# Patient Record
Sex: Female | Born: 1960
Health system: Southern US, Community
[De-identification: ages and names within clinical notes are randomized; demographics above are authoritative.]

## PROBLEM LIST (undated history)

## (undated) DIAGNOSIS — M810 Age-related osteoporosis without current pathological fracture: Secondary | ICD-10-CM

## (undated) DIAGNOSIS — G8929 Other chronic pain: Secondary | ICD-10-CM

## (undated) DIAGNOSIS — E785 Hyperlipidemia, unspecified: Secondary | ICD-10-CM

## (undated) DIAGNOSIS — J45909 Unspecified asthma, uncomplicated: Secondary | ICD-10-CM

## (undated) DIAGNOSIS — R519 Headache, unspecified: Secondary | ICD-10-CM

## (undated) DIAGNOSIS — A159 Respiratory tuberculosis unspecified: Secondary | ICD-10-CM

## (undated) DIAGNOSIS — F419 Anxiety disorder, unspecified: Secondary | ICD-10-CM

## (undated) DIAGNOSIS — G43909 Migraine, unspecified, not intractable, without status migrainosus: Secondary | ICD-10-CM

## (undated) DIAGNOSIS — F5104 Psychophysiologic insomnia: Secondary | ICD-10-CM

## (undated) DIAGNOSIS — M545 Low back pain, unspecified: Secondary | ICD-10-CM

## (undated) DIAGNOSIS — Z8781 Personal history of (healed) traumatic fracture: Secondary | ICD-10-CM

## (undated) DIAGNOSIS — F32A Depression, unspecified: Secondary | ICD-10-CM

## (undated) DIAGNOSIS — R7611 Nonspecific reaction to tuberculin skin test without active tuberculosis: Secondary | ICD-10-CM

## (undated) DIAGNOSIS — M8580 Other specified disorders of bone density and structure, unspecified site: Secondary | ICD-10-CM

## (undated) DIAGNOSIS — Z8489 Family history of other specified conditions: Secondary | ICD-10-CM

## (undated) DIAGNOSIS — R51 Headache: Secondary | ICD-10-CM

## (undated) DIAGNOSIS — M1990 Unspecified osteoarthritis, unspecified site: Secondary | ICD-10-CM

## (undated) DIAGNOSIS — F329 Major depressive disorder, single episode, unspecified: Secondary | ICD-10-CM

## (undated) DIAGNOSIS — K8020 Calculus of gallbladder without cholecystitis without obstruction: Secondary | ICD-10-CM

## (undated) DIAGNOSIS — E079 Disorder of thyroid, unspecified: Secondary | ICD-10-CM

## (undated) DIAGNOSIS — K219 Gastro-esophageal reflux disease without esophagitis: Secondary | ICD-10-CM

## (undated) DIAGNOSIS — T7840XA Allergy, unspecified, initial encounter: Secondary | ICD-10-CM

## (undated) DIAGNOSIS — C541 Malignant neoplasm of endometrium: Secondary | ICD-10-CM

## (undated) DIAGNOSIS — D649 Anemia, unspecified: Secondary | ICD-10-CM

## (undated) HISTORY — DX: Age-related osteoporosis without current pathological fracture: M81.0

## (undated) HISTORY — DX: Respiratory tuberculosis unspecified: A15.9

## (undated) HISTORY — PX: AUGMENTATION MAMMAPLASTY: SUR837

## (undated) HISTORY — DX: Unspecified asthma, uncomplicated: J45.909

## (undated) HISTORY — DX: Disorder of thyroid, unspecified: E07.9

## (undated) HISTORY — DX: Major depressive disorder, single episode, unspecified: F32.9

## (undated) HISTORY — DX: Psychophysiologic insomnia: F51.04

## (undated) HISTORY — DX: Depression, unspecified: F32.A

## (undated) HISTORY — DX: Allergy, unspecified, initial encounter: T78.40XA

## (undated) HISTORY — PX: FOOT NEUROMA SURGERY: SHX646

## (undated) HISTORY — DX: Hyperlipidemia, unspecified: E78.5

## (undated) HISTORY — DX: Other specified disorders of bone density and structure, unspecified site: M85.80

## (undated) HISTORY — DX: Unspecified osteoarthritis, unspecified site: M19.90

## (undated) HISTORY — PX: TONSILLECTOMY: SUR1361

## (undated) HISTORY — DX: Personal history of (healed) traumatic fracture: Z87.81

## (undated) HISTORY — DX: Malignant neoplasm of endometrium: C54.1

## (undated) HISTORY — PX: WISDOM TOOTH EXTRACTION: SHX21

## (undated) HISTORY — PX: TOTAL ABDOMINAL HYSTERECTOMY: SHX209

---

## 1999-10-13 ENCOUNTER — Encounter (INDEPENDENT_AMBULATORY_CARE_PROVIDER_SITE_OTHER): Payer: Self-pay | Admitting: Specialist

## 1999-10-13 ENCOUNTER — Ambulatory Visit (HOSPITAL_BASED_OUTPATIENT_CLINIC_OR_DEPARTMENT_OTHER): Admission: RE | Admit: 1999-10-13 | Discharge: 1999-10-13 | Payer: Self-pay | Admitting: Otolaryngology

## 1999-12-12 ENCOUNTER — Encounter: Payer: Self-pay | Admitting: Emergency Medicine

## 1999-12-12 ENCOUNTER — Emergency Department (HOSPITAL_COMMUNITY): Admission: EM | Admit: 1999-12-12 | Discharge: 1999-12-12 | Payer: Self-pay | Admitting: Emergency Medicine

## 2000-02-07 ENCOUNTER — Ambulatory Visit: Admission: RE | Admit: 2000-02-07 | Discharge: 2000-02-07 | Payer: Self-pay | Admitting: Otolaryngology

## 2000-05-03 ENCOUNTER — Ambulatory Visit (HOSPITAL_BASED_OUTPATIENT_CLINIC_OR_DEPARTMENT_OTHER): Admission: RE | Admit: 2000-05-03 | Discharge: 2000-05-03 | Payer: Self-pay | Admitting: Otolaryngology

## 2000-11-22 ENCOUNTER — Other Ambulatory Visit: Admission: RE | Admit: 2000-11-22 | Discharge: 2000-11-22 | Payer: Self-pay | Admitting: Internal Medicine

## 2001-02-04 ENCOUNTER — Encounter: Admission: RE | Admit: 2001-02-04 | Discharge: 2001-02-04 | Payer: Self-pay | Admitting: Internal Medicine

## 2001-02-04 ENCOUNTER — Encounter: Payer: Self-pay | Admitting: Internal Medicine

## 2001-02-06 ENCOUNTER — Encounter: Admission: RE | Admit: 2001-02-06 | Discharge: 2001-02-06 | Payer: Self-pay | Admitting: Neurological Surgery

## 2001-02-06 ENCOUNTER — Encounter: Payer: Self-pay | Admitting: Neurological Surgery

## 2001-03-20 ENCOUNTER — Encounter: Payer: Self-pay | Admitting: Neurological Surgery

## 2001-03-20 ENCOUNTER — Encounter: Admission: RE | Admit: 2001-03-20 | Discharge: 2001-03-20 | Payer: Self-pay | Admitting: Neurological Surgery

## 2001-05-08 ENCOUNTER — Encounter: Payer: Self-pay | Admitting: Neurological Surgery

## 2001-05-08 ENCOUNTER — Encounter: Admission: RE | Admit: 2001-05-08 | Discharge: 2001-05-08 | Payer: Self-pay | Admitting: Neurological Surgery

## 2001-09-11 DIAGNOSIS — C541 Malignant neoplasm of endometrium: Secondary | ICD-10-CM

## 2001-09-11 HISTORY — DX: Malignant neoplasm of endometrium: C54.1

## 2002-05-29 ENCOUNTER — Other Ambulatory Visit: Admission: RE | Admit: 2002-05-29 | Discharge: 2002-05-29 | Payer: Self-pay

## 2002-06-03 ENCOUNTER — Encounter: Admission: RE | Admit: 2002-06-03 | Discharge: 2002-06-03 | Payer: Self-pay

## 2002-07-02 ENCOUNTER — Encounter: Admission: RE | Admit: 2002-07-02 | Discharge: 2002-07-02 | Payer: Self-pay

## 2002-07-04 ENCOUNTER — Encounter (INDEPENDENT_AMBULATORY_CARE_PROVIDER_SITE_OTHER): Payer: Self-pay

## 2002-07-04 ENCOUNTER — Encounter (INDEPENDENT_AMBULATORY_CARE_PROVIDER_SITE_OTHER): Payer: Self-pay | Admitting: *Deleted

## 2002-07-04 ENCOUNTER — Ambulatory Visit (HOSPITAL_COMMUNITY): Admission: RE | Admit: 2002-07-04 | Discharge: 2002-07-04 | Payer: Self-pay

## 2002-07-15 ENCOUNTER — Ambulatory Visit: Admission: RE | Admit: 2002-07-15 | Discharge: 2002-07-15 | Payer: Self-pay | Admitting: Gynecology

## 2002-07-28 ENCOUNTER — Encounter: Payer: Self-pay | Admitting: Gynecology

## 2002-07-29 ENCOUNTER — Encounter (INDEPENDENT_AMBULATORY_CARE_PROVIDER_SITE_OTHER): Payer: Self-pay

## 2002-07-29 ENCOUNTER — Inpatient Hospital Stay (HOSPITAL_COMMUNITY): Admission: RE | Admit: 2002-07-29 | Discharge: 2002-08-01 | Payer: Self-pay | Admitting: Gynecology

## 2002-07-29 ENCOUNTER — Encounter (INDEPENDENT_AMBULATORY_CARE_PROVIDER_SITE_OTHER): Payer: Self-pay | Admitting: Specialist

## 2002-09-17 ENCOUNTER — Ambulatory Visit: Admission: RE | Admit: 2002-09-17 | Discharge: 2002-09-17 | Payer: Self-pay | Admitting: Gynecology

## 2002-12-24 ENCOUNTER — Other Ambulatory Visit: Admission: RE | Admit: 2002-12-24 | Discharge: 2002-12-24 | Payer: Self-pay

## 2003-02-06 ENCOUNTER — Encounter: Payer: Self-pay | Admitting: Internal Medicine

## 2003-02-06 ENCOUNTER — Encounter: Admission: RE | Admit: 2003-02-06 | Discharge: 2003-02-06 | Payer: Self-pay | Admitting: Internal Medicine

## 2003-04-23 ENCOUNTER — Other Ambulatory Visit: Admission: RE | Admit: 2003-04-23 | Discharge: 2003-04-23 | Payer: Self-pay

## 2003-08-24 ENCOUNTER — Other Ambulatory Visit: Admission: RE | Admit: 2003-08-24 | Discharge: 2003-08-24 | Payer: Self-pay | Admitting: Obstetrics and Gynecology

## 2003-12-22 ENCOUNTER — Other Ambulatory Visit: Admission: RE | Admit: 2003-12-22 | Discharge: 2003-12-22 | Payer: Self-pay | Admitting: Obstetrics and Gynecology

## 2004-04-25 ENCOUNTER — Other Ambulatory Visit: Admission: RE | Admit: 2004-04-25 | Discharge: 2004-04-25 | Payer: Self-pay | Admitting: Obstetrics and Gynecology

## 2004-05-02 ENCOUNTER — Ambulatory Visit (HOSPITAL_COMMUNITY): Admission: RE | Admit: 2004-05-02 | Discharge: 2004-05-02 | Payer: Self-pay | Admitting: Obstetrics and Gynecology

## 2004-08-30 ENCOUNTER — Encounter (INDEPENDENT_AMBULATORY_CARE_PROVIDER_SITE_OTHER): Payer: Self-pay | Admitting: Specialist

## 2004-08-30 ENCOUNTER — Ambulatory Visit (HOSPITAL_COMMUNITY): Admission: RE | Admit: 2004-08-30 | Discharge: 2004-08-30 | Payer: Self-pay | Admitting: Orthopedic Surgery

## 2004-10-17 ENCOUNTER — Other Ambulatory Visit: Admission: RE | Admit: 2004-10-17 | Discharge: 2004-10-17 | Payer: Self-pay | Admitting: Obstetrics and Gynecology

## 2004-11-02 ENCOUNTER — Ambulatory Visit: Payer: Self-pay | Admitting: Internal Medicine

## 2004-11-02 ENCOUNTER — Ambulatory Visit (HOSPITAL_BASED_OUTPATIENT_CLINIC_OR_DEPARTMENT_OTHER): Admission: RE | Admit: 2004-11-02 | Discharge: 2004-11-02 | Payer: Self-pay | Admitting: Internal Medicine

## 2005-03-20 ENCOUNTER — Other Ambulatory Visit: Admission: RE | Admit: 2005-03-20 | Discharge: 2005-03-20 | Payer: Self-pay | Admitting: Obstetrics and Gynecology

## 2005-05-29 ENCOUNTER — Ambulatory Visit (HOSPITAL_COMMUNITY): Admission: RE | Admit: 2005-05-29 | Discharge: 2005-05-29 | Payer: Self-pay | Admitting: Internal Medicine

## 2005-08-26 ENCOUNTER — Ambulatory Visit (HOSPITAL_COMMUNITY): Admission: RE | Admit: 2005-08-26 | Discharge: 2005-08-26 | Payer: Self-pay | Admitting: Neurosurgery

## 2006-05-30 ENCOUNTER — Ambulatory Visit (HOSPITAL_COMMUNITY): Admission: RE | Admit: 2006-05-30 | Discharge: 2006-05-30 | Payer: Self-pay | Admitting: Internal Medicine

## 2006-06-06 ENCOUNTER — Other Ambulatory Visit: Admission: RE | Admit: 2006-06-06 | Discharge: 2006-06-06 | Payer: Self-pay | Admitting: Obstetrics and Gynecology

## 2006-08-24 ENCOUNTER — Encounter: Admission: RE | Admit: 2006-08-24 | Discharge: 2006-08-24 | Payer: Self-pay | Admitting: *Deleted

## 2006-08-28 ENCOUNTER — Encounter: Admission: RE | Admit: 2006-08-28 | Discharge: 2006-08-28 | Payer: Self-pay | Admitting: *Deleted

## 2006-10-25 ENCOUNTER — Emergency Department (HOSPITAL_COMMUNITY): Admission: EM | Admit: 2006-10-25 | Discharge: 2006-10-25 | Payer: Self-pay | Admitting: Family Medicine

## 2006-12-26 ENCOUNTER — Emergency Department (HOSPITAL_COMMUNITY): Admission: EM | Admit: 2006-12-26 | Discharge: 2006-12-26 | Payer: Self-pay | Admitting: Family Medicine

## 2007-08-01 ENCOUNTER — Encounter: Admission: RE | Admit: 2007-08-01 | Discharge: 2007-08-01 | Payer: Self-pay | Admitting: General Surgery

## 2008-02-10 ENCOUNTER — Emergency Department (HOSPITAL_COMMUNITY): Admission: EM | Admit: 2008-02-10 | Discharge: 2008-02-10 | Payer: Self-pay | Admitting: Family Medicine

## 2008-02-19 ENCOUNTER — Emergency Department (HOSPITAL_COMMUNITY): Admission: EM | Admit: 2008-02-19 | Discharge: 2008-02-19 | Payer: Self-pay | Admitting: Family Medicine

## 2008-04-04 ENCOUNTER — Emergency Department (HOSPITAL_COMMUNITY): Admission: EM | Admit: 2008-04-04 | Discharge: 2008-04-04 | Payer: Self-pay | Admitting: Emergency Medicine

## 2009-05-28 ENCOUNTER — Ambulatory Visit (HOSPITAL_COMMUNITY): Admission: RE | Admit: 2009-05-28 | Discharge: 2009-05-28 | Payer: Self-pay | Admitting: Obstetrics and Gynecology

## 2009-06-04 ENCOUNTER — Other Ambulatory Visit: Admission: RE | Admit: 2009-06-04 | Discharge: 2009-06-04 | Payer: Self-pay | Admitting: Obstetrics and Gynecology

## 2009-06-04 ENCOUNTER — Encounter: Admission: RE | Admit: 2009-06-04 | Discharge: 2009-06-04 | Payer: Self-pay | Admitting: Obstetrics and Gynecology

## 2009-09-11 HISTORY — PX: COLONOSCOPY: SHX174

## 2009-11-25 ENCOUNTER — Encounter: Admission: RE | Admit: 2009-11-25 | Discharge: 2009-11-25 | Payer: Self-pay | Admitting: Obstetrics and Gynecology

## 2010-01-04 ENCOUNTER — Ambulatory Visit: Payer: Self-pay | Admitting: Vascular Surgery

## 2010-01-07 ENCOUNTER — Ambulatory Visit: Payer: Self-pay | Admitting: Vascular Surgery

## 2010-02-10 ENCOUNTER — Emergency Department (HOSPITAL_COMMUNITY): Admission: EM | Admit: 2010-02-10 | Discharge: 2010-02-10 | Payer: Self-pay | Admitting: Emergency Medicine

## 2010-02-11 ENCOUNTER — Ambulatory Visit: Payer: Self-pay | Admitting: Vascular Surgery

## 2010-02-21 ENCOUNTER — Emergency Department (HOSPITAL_COMMUNITY): Admission: EM | Admit: 2010-02-21 | Discharge: 2010-02-21 | Payer: Self-pay | Admitting: Emergency Medicine

## 2010-02-23 ENCOUNTER — Encounter: Admission: RE | Admit: 2010-02-23 | Discharge: 2010-02-23 | Payer: Self-pay | Admitting: Gastroenterology

## 2010-02-25 ENCOUNTER — Encounter: Admission: RE | Admit: 2010-02-25 | Discharge: 2010-02-25 | Payer: Self-pay | Admitting: Gastroenterology

## 2010-03-07 ENCOUNTER — Ambulatory Visit (HOSPITAL_COMMUNITY): Admission: RE | Admit: 2010-03-07 | Discharge: 2010-03-07 | Payer: Self-pay | Admitting: Gastroenterology

## 2010-06-27 ENCOUNTER — Ambulatory Visit (HOSPITAL_COMMUNITY): Admission: RE | Admit: 2010-06-27 | Discharge: 2010-06-27 | Payer: Self-pay | Admitting: Neurosurgery

## 2010-08-16 ENCOUNTER — Encounter: Admission: RE | Admit: 2010-08-16 | Discharge: 2010-08-16 | Payer: Self-pay | Admitting: Family Medicine

## 2010-11-28 LAB — POCT URINALYSIS DIP (DEVICE)
Bilirubin Urine: NEGATIVE
Glucose, UA: NEGATIVE mg/dL
Hgb urine dipstick: NEGATIVE
Ketones, ur: 15 mg/dL — AB
Nitrite: NEGATIVE
Protein, ur: NEGATIVE mg/dL
Specific Gravity, Urine: 1.025 (ref 1.005–1.030)
Urobilinogen, UA: 0.2 mg/dL (ref 0.0–1.0)
pH: 5 (ref 5.0–8.0)

## 2011-01-24 NOTE — Assessment & Plan Note (Signed)
OFFICE VISIT   Kathryn, [name]  DOB:  07-29-1961                                       02/11/2010  CHART#:[id number]   The patient presents today for discussion regarding her left leg.  She  called today regarding persistent difficulty following sclerotherapy.  Sclerotherapy session was actually 5 weeks ago.  On physical exam she  has typical irritation to her popliteal crease from the compression  garment.  She does have some blistering, specifically in the crease  itself.  She has been treating this appropriately with Neosporin and is  wearing an Ace wrap around this when she is out and about.  There is no  evidence of infection or surrounding erythema.  I reassured her that  this unfortunately will take some time for continued resolution of this  blistering that was related to her compression.  She will see Dr. Hart Rochester  in 1 month for continued discussion.     Larina Earthly, M.D.  Electronically Signed   TFE/MEDQ  D:  02/11/2010  T:  02/14/2010  Job:  [id number]   cc:   Quita Skye. Hart Rochester, M.D.

## 2011-01-24 NOTE — Procedures (Signed)
LOWER EXTREMITY VENOUS REFLUX EXAM   INDICATION:  Bilateral varicose veins with pain and swelling, previous  sclerotherapy bilaterally.   EXAM:  Using color-flow imaging and pulse Doppler spectral analysis, the  right and left common femoral, superficial femoral, popliteal, posterior  tibial, greater and lesser saphenous veins are evaluated.  There is  evidence suggesting deep venous insufficiency in the right lower  extremity.  The left is within normal limits.   The right and left saphenofemoral junctions are competent.  The right  and left GSV are competent.  The left lateral branch is not competent  with reflux of >500 milliseconds.   The right and left proximal short saphenous veins demonstrate  competency.   GSV Diameter (used if found to be incompetent only)                                            Right    Left  Proximal Greater Saphenous Vein           cm       cm  Proximal-to-mid-thigh                     cm       cm  Mid thigh                                 cm       cm  Mid-distal thigh                          cm       cm  Distal thigh                              cm       cm  Knee                                      cm       cm   IMPRESSION:  1. The right and left greater saphenous veins show no evidence of      significant reflux.  The left lateral branch does have reflux;      however, is small and tortuous.  2. The right and left greater saphenous veins are not aneurysmal.  3. The right and left greater saphenous veins are not tortuous.  4. The deep venous system is not competent on the right with reflux of      >568milliseconds.  The left is within normal limits.  5. The right and left lesser saphenous veins are competent.        ___________________________________________  Quita Skye. Hart Rochester, M.D.   AS/MEDQ  D:  01/04/2010  T:  01/04/2010  Job:  [id number]

## 2011-01-24 NOTE — Consult Note (Signed)
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  She has  no history of deep venous thrombosis, thrombophlebitis, pulmonary  emboli, bleeding ulceration, stasis ulcers or other venous  complications.  She has had an injection many years ago in [place]  for similar problem which relieved her symptoms.  She does not wear  elastic compression stockings nor elevate the leg as her job will not  allow this on a regular basis.  She does not take pain medication.   CHRONIC MEDICAL PROBLEMS:  1. History of endometrial cancer in 2003.  2. Asthma.  3. Negative for coronary artery disease, diabetes, hypertension,      hyperlipidemia, COPD or stroke.   FAMILY HISTORY:  Positive for coronary artery disease in her mother.  Negative for diabetes and stroke.   SOCIAL HISTORY:  She is married, has 2 children and does work as a  Engineer, materials.  She does not use tobacco or alcohol.   REVIEW OF SYSTEMS:  Does have occasional wheezing with asthma controlled  by Singulair most of the time.  She does have some leg discomfort while  walking and while lying flat.  All other systems in the review of  systems are negative.   PHYSICAL EXAMINATION:  Vital signs:  Blood pressure 120/64, heart rate  72, respirations 14.  General:  Well-developed, well-nourished female in  no apparent distress, alert and oriented x3.  HEENT:  Exam normal.  EOMs  intact.  Neck:  Supple with 3+ carotid pulses.  No bruits.  Chest:  Clear to auscultation.  No wheezing.  Cardiovascular:  Regular rhythm.  No murmurs.  Abdomen:  Soft, nontender with no masses.  Musculoskeletal:  Exam is free of major  deformities.  Neurologic:  Normal.  Skin:  Free of  rashes.  Lower extremity exam:  Reveals 3+ femoral, popliteal, and  dorsalis pedis pulse bilaterally.  There is no distal edema,  hyperpigmentation ulceration or bulging varicosities.  There is a  prominent vein in the distal left thigh which extends across the patella  consistent with the patient's symptomatology where she has pain which  worsens as the day progresses.   Today I ordered a venous duplex exam which I have reviewed and  interpreted.  Her deep system has no evidence of deep venous obstruction  and has no reflux.  She has some reflux in the lateral branch of her  left great saphenous vein which is very tortuous and not large.  She has  no reflux in the left great saphenous vein or small saphenous vein.   I do not see anything to treat from the standpoint of her reflux because  the vein is small and very tortuous.  The only treatment would be  sclerotherapy for this vein which was offered and she will consider to  be performed in the near future.     Quita Skye Hart Rochester, M.D.  Electronically Signed   JDL/MEDQ  D:  01/04/2010  T:  01/05/2010  Job:  [id number]

## 2011-01-27 NOTE — Op Note (Signed)
NAME:  Kathryn [name], Kathryn [name]                       ACCOUNT NO.:  [id number]   MEDICAL RECORD NO.:  [id number]                   PATIENT TYPE:  INP   LOCATION:  V409                                 [facility]ACILITY[facility]   PHYSICIAN:  De Blanch, M.D.         DATE OF BIRTH:  [birth year]   DATE OF PROCEDURE:  07/29/2002  DATE OF DISCHARGE:                                 OPERATIVE REPORT   PREOPERATIVE DIAGNOSIS:  Grade II endometrial adenocarcinoma.   POSTOPERATIVE DIAGNOSIS:  Grade II endometrial adenocarcinoma.   PROCEDURE:  Exploratory laparotomy, total abdominal hysterectomy and  bilateral salpingo-oophorectomy.  Pelvic and periaortic lymphadenectomy.   SURGEON:  De Blanch, M.D.   ASSISTANTS:  1. Ronda Fairly. Galen Daft, M.D.  2. Telford Nab, R.N.   ANESTHESIA:  General with orotracheal tube.   ESTIMATED BLOOD LOSS:  250 cc.   SURGICAL FINDINGS:  At the time of exploratory laparotomy, the upper abdomen  including the diaphragm, liver, stomach, omentum, small and large bowel and  appendix appeared normal.  There were no enlarged pelvic or periaortic lymph  nodes.  The uterus was normal size but retroverted.  The tubes and ovaries  appeared to have a minimal amount of adhesions and probable endometriosis.  On frozen section, there was no obvious remaining cancer in the uterus.   DESCRIPTION OF PROCEDURE:  The patient was brought to the operating room and  after satisfactory attainment of general anesthesia, was placed in the  modified lithotomy position in the [place] stirrups.  The anterior abdominal  wall, perineum, and vagina were prepped and with Betadine.  A Foley catheter  was placed.  The patient was draped.  The abdomen was entered through a low  midline incision.  The peritoneal washings were obtained.  The upper abdomen  and pelvis were explored with the above noted findings, and a Bookwalter  retractor was positioned.  The bowel was packed out of the  pelvis.  The  uterus was grasped with long Kelly clamps and the round ligaments divided at  the retroperitoneal spaces.  They were opened, identifying the vessels and  ureter.  The ovarian vessels were skeletonized, clamped, cut, free tied and  suture ligated.  The bladder flap was advanced with sharp and blunt  dissection.  The uterine vessels were skeletonized, clamped, cut and suture  ligated.  In a stepwise fashion, the paracervical and cardinal ligaments  were clamped, cut and suture ligated.  The vaginal angles were cross-  clamped, divided and the vagina transected from this connection to the  cervix.  The vaginal angles were transfixed and the central portion of the  vagina closed with a running locking suture of 0 Vicryl.   Attention was turned to the retroperitoneal spaces which were further  developed.  Pelvic lymphadenectomy was performed bilaterally removing lymph  nodes from the external iliac artery and vein, the internal iliac artery and  vein, internal iliac artery and obturator space.  Care  was taken to avoid  injury to the genitofemoral nerve and the obturator nerve.  Hemostasis was  achieved with cautery and hemoclips.   The Bookwalter retractor was repositioned and the bowel packed in order to  expose the periaortic region.  Incision was made overlying the peritoneum of  the right common iliac artery along the aorta.  The duodenum was mobilized  cephaloid.  The right ureter was identified and mobilized laterally.  The  lymph nodes overlying the right common iliac artery, vena cava, and aorta  were then resected up to approximately 2 cm above the inferior mesenteric  artery.  Hemostasis was again achieved with cautery and clips.  Hemostasis  was ascertained in this region.  The pelvis was reinspected and irrigated.  All packs were removed.  The anterior abdominal wall was then closed in  layers, first being a running mass closure using #1 PDS.  The subcutaneous   tissue was irrigated.  Hemostasis was achieved with cautery.  The skin was  closed with skin staples.  Sponge, needle and instrument counts were correct  x2.  The patient was transferred to the recovery room in satisfactory  condition.                                                De Blanch, M.D.    DC/MEDQ  D:  07/29/2002  T:  07/29/2002  Job:  [id number]   cc:   Ronda Fairly. Galen Daft, M.D.   Telford Nab, R.N.  [address] [place], [place] [address]  Fax: 1

## 2011-01-27 NOTE — Procedures (Signed)
NAME:  Kathryn [name], PLAISTED NO.:  [id number]   MEDICAL RECORD NO.:  [id number]          PATIENT TYPE:  OUT   LOCATION:  SLEEP CENTER                 FACILITY[facility]  [facility]   PHYSICIAN:  Clinton D. Maple Hudson, M.D. DATE OF BIRTH:  05/27/1961   DATE OF STUDY:  11/02/2004                              NOCTURNAL POLYSOMNOGRAM   DATE OF STUDY:  November 02, 2004   REFERRING PHYSICIAN:  Dr. Madison Hickman   INDICATION FOR STUDY:  Insomnia with sleep apnea.  Epworth Sleepiness Score  2/24, BMI 26.9, weight 158 pounds.   SLEEP ARCHITECTURE:  Total sleep time 327 minutes with sleep efficiency 74%.  Stage I was 15%, stage II 64%, stages III and IV were 13%, REM was 9% of  total sleep time.  Sleep latency 50.5 minutes, REM latency 296 minutes,  awake after sleep onset 62 minutes, arousal index 22.5.  No medications were  taken.   RESPIRATORY DATA:  Split-study protocol.  Respiratory disturbance index  (RDI) 16 per hour indicating mild to moderate obstructive sleep  apnea/hypopnea syndrome before CPAP.  This included 1 obstructive apnea, 1  mixed apnea and 37 hypopneas before CPAP.  Events were not positional.  REM  RDI was 2.1 per hour.  Respiratory events were associated with frequent  brief arousals and awakenings.  CPAP was titrated to 9 CWP, RDI 1 per hour  using a Data processing manager with medium nasal pillows and a heated  humidifier.   OXYGEN DATA:  Moderate snoring with oxygen desaturation to a nadir of 91%  before CPAP.  After CPAP control saturations held 94-98% on room air.   CARDIAC DATA:  Normal sinus rhythm.   MOVEMENT/PARASOMNIA:  A total of 151 limb jerks were recorded of which 19  were associated with arousal or awakening for a periodic limb movement with  arousal index of 3.5 per hour which is increased.   IMPRESSION/RECOMMENDATION:  1.  Moderate obstructive sleep apnea/hypopnea syndrome, respiratory      disturbance index 16 per hour with oxygen  desaturation to 91%.  2.  Successful continuous positive airway pressure titration to 9 CWP,      respiratory disturbance index 1 per hour, using a Artist with medium nasal pillows and a heated humidifier.  3.  Periodic limb movement with arousal which contributed to sleep      fragmentation, periodic leg movement index 3.5 per hour.      CDY/MEDQ  D:  11/06/2004 10:25:51  T:  11/06/2004 18:27:44  Job:  [id number]

## 2011-01-27 NOTE — Consult Note (Signed)
   NAME:  Kathryn [name], Kathryn [name]                       ACCOUNT NO.:  [id number]   MEDICAL RECORD NO.:  [id number]                   PATIENT TYPE:  OUT   LOCATION:  GYN                                  FACILITY:  [facility]   PHYSICIAN:  De Blanch, M.D.         DATE OF BIRTH:  October 14, 1960   DATE OF CONSULTATION:  09/17/2002  DATE OF DISCHARGE:                                   CONSULTATION   REASON FOR CONSULTATION:  A 50 year old white female returns for  postoperative follow-up. She underwent a TAH/BSO, pelvic and periaortic  lymphadenectomy on July 29, 2002 for a grade 2 endometrial carcinoma.  Final pathology showed no residual disease in the uterus and all lymph nodes  were negative. The patient's had an uncomplicated postoperative course. She  saw Dr. Galen Daft several weeks ago and she was placed on Paxil.   The patient denies any abdominal pain or pressure or any other GI or GU  symptoms. She has no vaginal bleeding.   PHYSICAL EXAMINATION:  VITAL SIGNS:  Weight 142 pounds, blood pressure  114/76.  GENERAL:  The patient is a healthy, white female in no acute distress.  HEENT:  Negative.  NECK:  Supple without thyromegaly. There is no supraclavicular or inguinal  adenopathy.  ABDOMEN:  Soft and nontender. No mass, organomegaly, ascites or hernias are  noted.  PELVIC:  EGBUS, vagina, bladder, urethra are normal. The cuff is healing  nicely. Bimanual exam reveals minimal postoperative induration. No masses or  tenderness is noted.   IMPRESSION:  Stage 1A grade 2 endometrial adenocarcinoma. The patient is  given the okay to return to full levels of activity. With regard to follow-  up, I would recommend she, despite her excellent prognosis, be followed as  per the routine. That would be an exam every three months in the first year,  every four months in the second year and every six months thereafter. She  will return to the care of Dr. Galen Daft for her follow-up visits, the first  one  to be in approximately three months.                                               De Blanch, M.D.    DC/MEDQ  D:  09/17/2002  T:  09/17/2002  Job:  [id number]   cc:   Telford Nab, R.N.  [address] [place], [place] [address]  Fax: 1   Ronda Fairly. Galen Daft, M.D.  [address], [address]  [place]  [place] [address]  Fax: [phone]

## 2011-01-27 NOTE — Op Note (Signed)
NAME:  Kathryn [name], Kathryn [name]                       ACCOUNT NO.:  [id number]   MEDICAL RECORD NO.:  [id number]                   PATIENT TYPE:  AMB   LOCATION:  SDC                                  FACILITY:  [facility]   PHYSICIAN:  Ronda Fairly. Galen Daft, M.D.              DATE OF BIRTH:  July 24, 1961   DATE OF PROCEDURE:  07/04/2002  DATE OF DISCHARGE:                                 OPERATIVE REPORT   PREOPERATIVE DIAGNOSES:  Uterine mass, intracavitary.   POSTOPERATIVE DIAGNOSES:  Endometriosis, pelvic pain, and uterine polyp.   PROCEDURE:  Diagnostic laparoscopy with biopsy.   SECONDARY PROCEDURE:  Hysteroscopy with removal of polyp and curettage.   COMPLICATIONS OF PROCEDURES:  None.   CONDITION ON DISCHARGE:  Stable.   SURGEON:  Ronda Fairly. Galen Daft, M.D.   ANESTHESIA:  General.   PROCEDURE NOTE:  The patient was identified as Kathryn [name].  Informed  consent was obtained, and this was verified in the operating room as well.  The patient had Betadine prep, sterile technique utilized.  The abdomen was  entered through a Veress needle insufflation at the umbilical site and a 5  mm trocar.  This was done without trauma to the abdominal structures.  The  lower pelvis showed evidence of endometriosis.  This was further identified  and biopsied with a secondary puncture in the left lower quadrant, and  another 5 mm trocar done under direct visualization. Carbon dioxide was the  medium of insufflation.  This procedure was then complete after biopsies  were performed.  The endometriosis appeared to be limited to the posterior  cul-de-sac close to the right ovary and left ovary and also in the  uterosacral ligament area.  Pictures were taken.  The pictures were left  with the medical record at the hospital.  The secondary copy was provided to  the patient for evaluation.  The abdomen was completely hemostatic.  There  was some blood noted in the pelvis at the beginning of the procedure, which  was part of the endometriosis, which was aspirated and sent to cytology for  further evaluation.  It was also possible that was blood from a hemorrhagic  cyst that was previously identified on ultrasound.  The abdomen was  irrigated, and the fluid was removed with the same syringe technique.  The  abdomen was completely hemostatic.  Both ports were hemostatic, done with  direct visualization, and the surgical biopsy sites on the posterior cul-de-  sac also were hemostatic.  The ovaries were unremarkable.  The left ovary  had some follicular cysts.  The right ovary had no evident cystic material.  The right tube and left tube were unremarkable.  The uterus itself was  unremarkable with the exception of the posterior cul-de-sac. The serosal  surface did seem to have some endometriosis present.  The anterior cul-de-  sac was negative.  The upper abdomen was unremarkable and appendix  unremarkable.  After the abdomen had been deflated of carbon dioxide gas,  the trocars were removed under direct visualization.  There was no bleeding,  and the 5 mm sites were closed with 3-0 Monocryl and injected with 0.25%  Marcaine for anesthetic relief, a total of 10 cc.  The upper abdominal  portions were then complete.  The hysteroscopy portion was then performed  using a diagnostic hysteroscope.  The uterine polyp was identified.  It was  a fairly large, fleshy mass that was able to be resected with the polyp  forceps after identifying its location.  The uterine curettage was  performed. The hysteroscope was placed back in at the end, and it was  completely removed and identified in total in its fragmentation.  The uterus  was uninjured during the process.  The patient tolerated the procedure well.  All instruments were removed. There was complete hemostasis control  throughout the case.  All instrument, sponge, and needle counts were correct  at the end of the case, and she left the operating room in  stable condition.  She was discharged home with a prescription of Percocet, 40 pills, and  directions and dosage.                                               Ronda Fairly. Galen Daft, M.D.    NJT/MEDQ  D:  07/05/2002  T:  07/06/2002  Job:  [id number]
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
## 2011-01-27 NOTE — Discharge Summary (Signed)
   NAME:  Kathryn [name], Kathryn [name]                       ACCOUNT NO.:  [id number]   MEDICAL RECORD NO.:  [id number]                   PATIENT TYPE:  INP   LOCATION:  0447                                 FACILITY:  [facility]   PHYSICIAN:  Ronda Fairly. Galen Daft, M.D.              DATE OF BIRTH:  01-Jan-1961   DATE OF ADMISSION:  07/29/2002  DATE OF DISCHARGE:  08/01/2002                                 DISCHARGE SUMMARY   PRINCIPAL DIAGNOSIS:  Endometrial cancer.   SECONDARY DIAGNOSIS:  None.   COMPLICATIONS:  None.   PRINCIPAL PROCEDURE:  TAH/BSO with lymph node dissection.   CONDITION ON DISCHARGE:  Improved.   FINAL DIAGNOSIS:  Endometrial cancer.   HOSPITAL COURSE:  The patient is admitted as Kathryn [name], underwent  exploratory laparotomy, TAH/BSO with lymph node dissection. The patient had  D&C with polypectomy showing endometrial cancer. The uterine specimen on  July 29, 2002 showed no evidence of hyperplasia or residual carcinoma  identified. It was only limited to the polyp which had been previously  resected. The iliac and aortic nodes were all negative. The tubes were  negative. There were washings performed which showed atypical cells present.  There was also the endometrium diagnosed as proliferative. After her  operative course which was unremarkable, she was in the postoperative period  without difficulty. On postoperative day two, she had some slight abdominal  distention, her bowel function had not yet been completely recognized and  she was on clear liquids. Toradol was utilized and PCA. She did very very  well. On postoperative day three, she was hungry and she had positive bowel  sounds. Her hemoglobin was 8.6 gm, her white count 4500. She was given a  regular diet on day three and full instructions regarding activity limits,  wound care, follow-up in the office, medications and diet were explained to  the patient prior to discharge.                 Ronda Fairly. Galen Daft, M.D.    NJT/MEDQ  D:  08/18/2002  T:  08/18/2002  Job:  [id number]

## 2011-01-27 NOTE — Consult Note (Signed)
NAME:  Kathryn [name], KLETT NO.:  [id number]   MEDICAL RECORD NO.:  [id number]                    PATIENT TYPE:   LOCATION:                                       FACILITY:   PHYSICIAN:  De Blanch, M.D.         DATE OF BIRTH:   DATE OF CONSULTATION:  DATE OF DISCHARGE:                                   CONSULTATION   HISTORY OF PRESENT ILLNESS:  The patient is a 50 year old white married  female seen in consult at the request of Gwendalyn Ege, M.D., regarding  management of a newly diagnosed endometrial carcinoma. The patient developed  slight irregular bleeding and moderate to severe abdominal pain and  presented to Dr. Galen Daft for evaluation. She underwent diagnostic laparoscopy  and hysteroscopy with D&C on July 04, 2002. She was found to have  endometriosis limited to the posterior cul-de-sac, close to the right ovary  and uterosacral ligaments. Biopsy confirmed this to be endometriosis.  Peritoneal washings were obtained which were atypical and suspicious for  malignancy at that time. Subsequently, the patient underwent hysteroscopy  with removal of the polyp and the polyp contained moderately differentiated  adenocarcinoma. There was a small amount of poorly differentiated cancer in  the specimen as well. The patient has had no problems following surgery and  presents for consultation today.   PAST MEDICAL HISTORY:  Includes asthma (stable), osteoporosis, and sleep  apnea. The patient uses C-Pap at night.   PAST SURGICAL HISTORY:  The patient has a septoplasty and breast  augmentation.   CURRENT MEDICATIONS:  Include Fosamax, Zyrtec, Prevacid, Singulair, Ambien,  and iron supplements.   ALLERGIES:  None.   OB/GYN HISTORY:  Gravida II.   SOCIAL HISTORY:  The patient is married to a CRNA who works at [employer]. 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 She  does not smoke. She drinks alcohol only  rarely.   FAMILY HISTORY:  Negative for GYN, breast, or colon cancer.   REVIEW OF SYSTEMS:  Reveals that her pain has resolved. She is not having  any spotting at the present time. She denies any other GI or GU symptoms.  She does have a routine exercise program.   PHYSICAL EXAMINATION:  VITAL SIGNS: Weight 154 pounds. Height 5'5. Blood  pressure 112/70. Pulse 72.  GENERAL: Healthy white female in no acute distress.  HEENT: Negative.  NECK: Supple without thyromegaly. No supraclavicular or inguinal adenopathy.  ABDOMEN: Soft and nontender. No masses, organomegaly, ascites, or hernias  are noted. Laparoscopy scars are healing nicely.  PELVIC: Vagina, bladder, and urethra are normal. Cervix appears normal. The  uterus is anterior, slightly enlarged, without any nodularity. Rectal-  vaginal examination confirms.   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   PLAN:  I would recommend that she undergo total abdominal hysterectomy,  bilateral  salpingo-oophorectomy, and probably pelvic and peri-aortic  lymphadenectomy. We will repeat the peritoneal washings and explore the  peritoneal cavity carefully at that time. The risks of surgery including  hemorrhage, infection, injury to adjacent vessels, thromboembolic  complications, and anesthetic risks were outlined to the patient and her  husband. They are obviously knowledgeable about these medical issues. All  other questions are answered. The patient is desirous of taking a trip,  which was planned to [place] next week, and then will return thereafter to  have surgery.                                                              De Blanch, M.D.    DC/MEDQ  D:  07/16/2002  T:  07/16/2002  Job:  [id number]   cc:   Ronda Fairly. Galen Daft, M.D.   Telford Nab, R.N.  [address] [place], [place] [address]  Fax: 1   Cristi Loron, M.D.

## 2011-01-27 NOTE — Op Note (Signed)
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   PROCEDURE:  Excision, Morton's neuroma, left third web space.   SURGEON:  Nadara Mustard, MD   ANESTHESIA:  LMA plus popliteal block.   ESTIMATED BLOOD LOSS:  Minimal.   PATHOLOGY:  Neuroma sent to pathology.   TOURNIQUET TIME:  Esmarch at the ankle for approximately 15 minutes.   DISPOSITION:  To PACU in stable condition, planned for discharge to home.   INDICATION FOR PROCEDURE:  The patient is a 50 year old woman with a painful  Morton's neuroma, left third web space.  She has had temporary relief with  steroid injections; however, she has failed prolonged conservative care for  over a year and presents at this time for surgical intervention.  The risks  and benefits were discussed, including infection, neurovascular injury,  persistent pain, need for additional surgery.  The patient states she  understands and wishes to proceed at this time.   DESCRIPTION OF PROCEDURE:  The patient underwent a popliteal block in the  holding area.  She then was brought back to OR room 17.  She then had her  left lower extremity prepped using DuraPrep and draped into a sterile field.  The foot was elevated and the Esmarch was wrapped around the ankle for  tourniquet control.  The patient s till had some sensation, and she  underwent an LMA anesthetic.  After adequate levels of anesthesia obtained,  the dorsal incision was made over the third web space and blunt dissection  was carried down to  the intermetatarsal ligament, which was split.  The  neuroma was delivered into the surgical field with plantar pressure.  The  two proximal and two distal extensions of the neuroma were resected.  The  wound was irrigated with normal saline and the Esmarch was released,  hemostasis was obtained.  The wound was closed using a 3-0 nylon with a  vertical mattress suture.  The wound was covered with Adaptic, orthopedic  sponges, sterile Webril, and a Coban dressing.  The patient was then  extubated and taken to PACU in stable condition, planned for discharge to  home, follow up in the office in two weeks, change the dressing in three  days.      Vernia Buff   MVD/MEDQ  D:  08/30/2004  T:  08/31/2004  Job:  [id number]

## 2011-04-03 ENCOUNTER — Encounter (HOSPITAL_BASED_OUTPATIENT_CLINIC_OR_DEPARTMENT_OTHER): Payer: 59 | Admitting: Oncology

## 2011-04-03 ENCOUNTER — Other Ambulatory Visit (HOSPITAL_COMMUNITY): Payer: Self-pay | Admitting: Oncology

## 2011-04-03 DIAGNOSIS — D72819 Decreased white blood cell count, unspecified: Secondary | ICD-10-CM

## 2011-04-03 LAB — MORPHOLOGY
PLT EST: ADEQUATE
RBC Comments: NORMAL

## 2011-04-03 LAB — CBC WITH DIFFERENTIAL/PLATELET
BASO%: 0.6 % (ref 0.0–2.0)
Basophils Absolute: 0 10*3/uL (ref 0.0–0.1)
EOS%: 3.5 % (ref 0.0–7.0)
Eosinophils Absolute: 0.1 10*3/uL (ref 0.0–0.5)
HCT: 37.5 % (ref 34.8–46.6)
HGB: 13.1 g/dL (ref 11.6–15.9)
LYMPH%: 37.3 % (ref 14.0–49.7)
MCH: 32 pg (ref 25.1–34.0)
MCHC: 34.9 g/dL (ref 31.5–36.0)
MCV: 91.9 fL (ref 79.5–101.0)
MONO#: 0.3 10*3/uL (ref 0.1–0.9)
MONO%: 8.5 % (ref 0.0–14.0)
NEUT#: 1.9 10*3/uL (ref 1.5–6.5)
NEUT%: 50.1 % (ref 38.4–76.8)
Platelets: 201 10*3/uL (ref 145–400)
RBC: 4.08 10*6/uL (ref 3.70–5.45)
RDW: 12.9 % (ref 11.2–14.5)
WBC: 3.8 10*3/uL — ABNORMAL LOW (ref 3.9–10.3)
lymph#: 1.4 10*3/uL (ref 0.9–3.3)

## 2011-04-03 LAB — CHCC SMEAR

## 2011-04-04 LAB — COMPREHENSIVE METABOLIC PANEL
ALT: 12 U/L (ref 0–35)
AST: 14 U/L (ref 0–37)
Albumin: 4.5 g/dL (ref 3.5–5.2)
Alkaline Phosphatase: 36 U/L — ABNORMAL LOW (ref 39–117)
BUN: 14 mg/dL (ref 6–23)
CO2: 23 mEq/L (ref 19–32)
Calcium: 10 mg/dL (ref 8.4–10.5)
Chloride: 104 mEq/L (ref 96–112)
Creatinine, Ser: 0.8 mg/dL (ref 0.50–1.10)
Glucose, Bld: 105 mg/dL — ABNORMAL HIGH (ref 70–99)
Potassium: 4 mEq/L (ref 3.5–5.3)
Sodium: 139 mEq/L (ref 135–145)
Total Bilirubin: 0.4 mg/dL (ref 0.3–1.2)
Total Protein: 7.4 g/dL (ref 6.0–8.3)

## 2011-04-04 LAB — LACTATE DEHYDROGENASE: LDH: 120 U/L (ref 94–250)

## 2011-04-04 LAB — ANA: Anti Nuclear Antibody(ANA): NEGATIVE

## 2011-05-03 ENCOUNTER — Other Ambulatory Visit: Payer: Self-pay | Admitting: Family Medicine

## 2011-05-03 DIAGNOSIS — N63 Unspecified lump in unspecified breast: Secondary | ICD-10-CM

## 2011-05-19 ENCOUNTER — Other Ambulatory Visit: Payer: Self-pay | Admitting: Internal Medicine

## 2011-05-19 ENCOUNTER — Other Ambulatory Visit: Payer: 59

## 2011-05-19 ENCOUNTER — Ambulatory Visit
Admission: RE | Admit: 2011-05-19 | Discharge: 2011-05-19 | Disposition: A | Discharge status: Home / Self Care | Payer: 59 | Source: Ambulatory Visit | Attending: Family Medicine | Admitting: Family Medicine

## 2011-05-19 DIAGNOSIS — N63 Unspecified lump in unspecified breast: Secondary | ICD-10-CM

## 2011-05-20 ENCOUNTER — Inpatient Hospital Stay (INDEPENDENT_AMBULATORY_CARE_PROVIDER_SITE_OTHER)
Admission: RE | Admit: 2011-05-20 | Discharge: 2011-05-20 | Disposition: A | Discharge status: Home / Self Care | Payer: 59 | Source: Ambulatory Visit | Attending: Family Medicine | Admitting: Family Medicine

## 2011-05-20 ENCOUNTER — Emergency Department (HOSPITAL_COMMUNITY): Payer: 59

## 2011-05-20 ENCOUNTER — Emergency Department (HOSPITAL_COMMUNITY)
Admission: EM | Admit: 2011-05-20 | Discharge: 2011-05-20 | Disposition: A | Discharge status: Home / Self Care | Payer: 59 | Attending: Emergency Medicine | Admitting: Emergency Medicine

## 2011-05-20 DIAGNOSIS — F329 Major depressive disorder, single episode, unspecified: Secondary | ICD-10-CM | POA: Insufficient documentation

## 2011-05-20 DIAGNOSIS — R112 Nausea with vomiting, unspecified: Secondary | ICD-10-CM | POA: Insufficient documentation

## 2011-05-20 DIAGNOSIS — R51 Headache: Secondary | ICD-10-CM

## 2011-05-20 DIAGNOSIS — M810 Age-related osteoporosis without current pathological fracture: Secondary | ICD-10-CM | POA: Insufficient documentation

## 2011-05-20 DIAGNOSIS — Z8542 Personal history of malignant neoplasm of other parts of uterus: Secondary | ICD-10-CM | POA: Insufficient documentation

## 2011-05-20 DIAGNOSIS — Z79899 Other long term (current) drug therapy: Secondary | ICD-10-CM | POA: Insufficient documentation

## 2011-05-20 DIAGNOSIS — R21 Rash and other nonspecific skin eruption: Secondary | ICD-10-CM | POA: Insufficient documentation

## 2011-05-20 DIAGNOSIS — F3289 Other specified depressive episodes: Secondary | ICD-10-CM | POA: Insufficient documentation

## 2011-05-20 DIAGNOSIS — H53149 Visual discomfort, unspecified: Secondary | ICD-10-CM | POA: Insufficient documentation

## 2011-06-08 LAB — POCT URINALYSIS DIP (DEVICE)
Bilirubin Urine: NEGATIVE
Glucose, UA: NEGATIVE
Hgb urine dipstick: NEGATIVE
Ketones, ur: NEGATIVE
Nitrite: NEGATIVE
Operator id: 239701
Protein, ur: NEGATIVE
Specific Gravity, Urine: 1.02
Urobilinogen, UA: 0.2
pH: 5.5

## 2011-06-08 LAB — POCT PREGNANCY, URINE
Operator id: 239701
Preg Test, Ur: NEGATIVE

## 2011-06-09 LAB — URINE MICROSCOPIC-ADD ON

## 2011-06-09 LAB — URINALYSIS, ROUTINE W REFLEX MICROSCOPIC
Bilirubin Urine: NEGATIVE
Glucose, UA: NEGATIVE
Hgb urine dipstick: NEGATIVE
Ketones, ur: NEGATIVE
Nitrite: NEGATIVE
Protein, ur: NEGATIVE
Specific Gravity, Urine: 1.029
Urobilinogen, UA: 0.2
pH: 5

## 2011-10-06 ENCOUNTER — Ambulatory Visit (INDEPENDENT_AMBULATORY_CARE_PROVIDER_SITE_OTHER): Payer: 59

## 2011-10-06 DIAGNOSIS — R1084 Generalized abdominal pain: Secondary | ICD-10-CM

## 2011-10-06 DIAGNOSIS — G43009 Migraine without aura, not intractable, without status migrainosus: Secondary | ICD-10-CM

## 2011-10-06 DIAGNOSIS — R112 Nausea with vomiting, unspecified: Secondary | ICD-10-CM

## 2012-03-07 ENCOUNTER — Encounter: Payer: Self-pay | Admitting: Obstetrics and Gynecology

## 2012-05-22 ENCOUNTER — Encounter: Payer: Self-pay | Admitting: Internal Medicine

## 2012-05-22 ENCOUNTER — Ambulatory Visit (INDEPENDENT_AMBULATORY_CARE_PROVIDER_SITE_OTHER): Payer: 59 | Admitting: Internal Medicine

## 2012-05-22 VITALS — BP 102/74 | HR 85 | Temp 98.4°F | Ht 63.5 in | Wt 160.0 lb

## 2012-05-22 DIAGNOSIS — M949 Disorder of cartilage, unspecified: Secondary | ICD-10-CM

## 2012-05-22 DIAGNOSIS — Z8542 Personal history of malignant neoplasm of other parts of uterus: Secondary | ICD-10-CM

## 2012-05-22 DIAGNOSIS — G43909 Migraine, unspecified, not intractable, without status migrainosus: Secondary | ICD-10-CM

## 2012-05-22 DIAGNOSIS — M8580 Other specified disorders of bone density and structure, unspecified site: Secondary | ICD-10-CM

## 2012-05-22 DIAGNOSIS — J45909 Unspecified asthma, uncomplicated: Secondary | ICD-10-CM

## 2012-05-22 DIAGNOSIS — F329 Major depressive disorder, single episode, unspecified: Secondary | ICD-10-CM

## 2012-05-22 DIAGNOSIS — F32A Depression, unspecified: Secondary | ICD-10-CM

## 2012-05-22 DIAGNOSIS — Z8249 Family history of ischemic heart disease and other diseases of the circulatory system: Secondary | ICD-10-CM

## 2012-05-22 DIAGNOSIS — G4700 Insomnia, unspecified: Secondary | ICD-10-CM

## 2012-05-22 DIAGNOSIS — F3289 Other specified depressive episodes: Secondary | ICD-10-CM

## 2012-05-22 DIAGNOSIS — F980 Enuresis not due to a substance or known physiological condition: Secondary | ICD-10-CM

## 2012-05-22 DIAGNOSIS — F5104 Psychophysiologic insomnia: Secondary | ICD-10-CM

## 2012-05-22 DIAGNOSIS — G4733 Obstructive sleep apnea (adult) (pediatric): Secondary | ICD-10-CM

## 2012-05-22 DIAGNOSIS — M810 Age-related osteoporosis without current pathological fracture: Secondary | ICD-10-CM | POA: Insufficient documentation

## 2012-05-22 DIAGNOSIS — Z23 Encounter for immunization: Secondary | ICD-10-CM

## 2012-05-22 DIAGNOSIS — F339 Major depressive disorder, recurrent, unspecified: Secondary | ICD-10-CM | POA: Insufficient documentation

## 2012-05-22 DIAGNOSIS — Z0000 Encounter for general adult medical examination without abnormal findings: Secondary | ICD-10-CM

## 2012-05-22 DIAGNOSIS — M899 Disorder of bone, unspecified: Secondary | ICD-10-CM

## 2012-05-22 DIAGNOSIS — R32 Unspecified urinary incontinence: Secondary | ICD-10-CM

## 2012-05-22 HISTORY — DX: Obstructive sleep apnea (adult) (pediatric): G47.33

## 2012-05-22 LAB — CBC WITH DIFFERENTIAL/PLATELET
Basophils Absolute: 0 10*3/uL (ref 0.0–0.1)
Basophils Relative: 0.3 % (ref 0.0–3.0)
Eosinophils Absolute: 0.1 10*3/uL (ref 0.0–0.7)
Eosinophils Relative: 1.1 % (ref 0.0–5.0)
HCT: 36.8 % (ref 36.0–46.0)
Hemoglobin: 12.3 g/dL (ref 12.0–15.0)
Lymphocytes Relative: 21.2 % (ref 12.0–46.0)
Lymphs Abs: 1.1 10*3/uL (ref 0.7–4.0)
MCHC: 33.3 g/dL (ref 30.0–36.0)
MCV: 97 fl (ref 78.0–100.0)
Monocytes Absolute: 0.4 10*3/uL (ref 0.1–1.0)
Monocytes Relative: 8.3 % (ref 3.0–12.0)
Neutro Abs: 3.6 10*3/uL (ref 1.4–7.7)
Neutrophils Relative %: 69.1 % (ref 43.0–77.0)
Platelets: 187 10*3/uL (ref 150.0–400.0)
RBC: 3.79 Mil/uL — ABNORMAL LOW (ref 3.87–5.11)
RDW: 12.9 % (ref 11.5–14.6)
WBC: 5.2 10*3/uL (ref 4.5–10.5)

## 2012-05-22 LAB — T4, FREE: Free T4: 0.86 ng/dL (ref 0.60–1.60)

## 2012-05-22 LAB — TSH: TSH: 0.41 u[IU]/mL (ref 0.35–5.50)

## 2012-05-22 NOTE — Patient Instructions (Signed)
Try to taper trazodone to 200 mg over the next 4 weeks

## 2012-05-22 NOTE — Assessment & Plan Note (Addendum)
Patient concerned about her family history of coronary artery disease. She is not having any symptoms of coronary ischemia. Obtain screening labs including fasting lipid profile and high-sensitivity CRP.  EKG showed normal sinus rhythm at 69 bpm.

## 2012-05-22 NOTE — Progress Notes (Signed)
Subjective:    Patient ID: Kathryn [name], female    DOB: 1961-05-23, 51 y.o.   MRN: [id number]  HPI  51 year old white female to establish. Patient previously followed by [facility] family practice [place] [employer]. She was then seen by Dr. Allyne Gee for 2 office visits. She was unhappy with her care and is transferring to our practice. Patient has complex medical history.  She reports history of endometrial cancer. She status post hysterectomy 2003. There has been no sign of cancer recurrence.  She also has history of depression and is followed by therapist Steward Ros. She currently takes fluoxetine 10 mg in the morning and alprazolam 0.25 mg one half tablet as needed. She reports significant issues with sleep in the past. She was previously taking 20 mg of zolpidem daily. This was decreased to 10 mg. But now she is taking trazodone 300 mg at bedtime.  She has history of objective sleep apnea but has not followed by sleep specialist. She is currently not using CPAP.  She has history of migraine headaches. She is followed by neurologist Dr. Vela Prose.  She also has history of mild asthma. She reports her symptoms are controlled on daily Singulair. She occasionally uses a Ventolin inhaler. She feels her symptoms are exacerbated from second hand smoke. Her husband is a smoker. She also reports recent exacerbation while visiting her mother in [place]. She was previously on Pulmicort but discontinued due to concerns of exacerbating her osteopenia.  Patient complains of enuresis.  This has been going on for several months.  She denies urinary issues during the day.  She also worries about her family history of CAD.  Her mother developed heart problems in her 42s.  Review of Systems  Constitutional: Negative for activity change, appetite change and unexpected weight change.  Eyes: Negative for visual disturbance.  Respiratory: Negative for cough, chest tightness and shortness of breath.     Cardiovascular: Negative for chest pain.  Genitourinary: Negative for difficulty urinating.  Neurological: positive for headaches.  Gastrointestinal: Negative for abdominal pain, heartburn melena or hematochezia Psych: chronic sleep issues Endo:  No polyuria or polydypsia    Past Medical History  Diagnosis Date  . Asthma   . Endometrial cancer   . Depression   . History of migraine headaches     Followed by Dr. Vela Prose  . Chronic insomnia   . Osteopenia   . History of compression fracture of spine   . Obstructive sleep apnea     History   Social History  . Marital Status: Married    Spouse Name: N/A    Number of Children: N/A  . Years of Education: N/A   Occupational History  . Not on file.   Social History Main Topics  . Smoking status: Never Smoker   . Smokeless tobacco: Not on file  . Alcohol Use: No  . Drug Use: No  . Sexually Active: Not on file   Other Topics Concern  . 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 Abdominal hysterectomy 2003    Family History  Problem Relation Age of Onset  . Coronary artery disease Mother   . Breast cancer Maternal Grandmother   . Diabetes type II Maternal Grandfather     Allergies not on file  Current Outpatient Prescriptions on File Prior to Visit  Medication Sig  Dispense Refill  . albuterol (VENTOLIN HFA) 108 (90 BASE) MCG/ACT inhaler Inhale 2 puffs into the lungs every 6 (six) hours as needed.      . cetirizine (ZYRTEC) 10 MG tablet Take 10 mg by mouth daily.      [name] FLUoxetine (PROZAC) 10 MG tablet Take 10 mg by mouth daily.      . montelukast (SINGULAIR) 10 MG tablet Take 10 mg by mouth at bedtime.      . rizatriptan (MAXALT) 10 MG tablet Take 10 mg by mouth as needed. May repeat in 2 hours if needed      . topiramate (TOPAMAX) 100 MG tablet Take 100 mg by mouth 2 (two) times daily.      .  traZODone (DESYREL) 100 MG tablet Take 100 mg by mouth at bedtime.      [name] zolpidem (AMBIEN) 10 MG tablet Take 10 mg by mouth at bedtime as needed.        BP 102/74  Pulse 85  Temp 98.4 F (36.9 C) (Oral)  Ht 5' 3.5" (1.613 m)  Wt 160 lb (72.576 kg)  BMI 27.90 kg/m2       Objective:   Physical Exam  Constitutional: She is oriented to person, place, and time. She appears well-developed and well-nourished.  HENT:  Head: Normocephalic and atraumatic.  Right Ear: External ear normal.  Eyes: EOM are normal. Pupils are equal, round, and reactive to light.  Neck:       No carotid bruit  Cardiovascular: Normal rate, regular rhythm and normal heart sounds.   Pulmonary/Chest: Effort normal and breath sounds normal. She has no wheezes.  Abdominal: Soft. Bowel sounds are normal. She exhibits no mass.  Musculoskeletal: She exhibits no edema.  Lymphadenopathy:    She has no cervical adenopathy.  Neurological: She is alert and oriented to person, place, and time. No cranial nerve deficit.  Skin: Skin is warm and dry.  Psychiatric:       Appears anxious  emotionally labile     Assessment & Plan:

## 2012-05-22 NOTE — Assessment & Plan Note (Signed)
I suspect patient's enuresis secondary to her objective sleep apnea and use of high dose of trazodone. Patient advised to taper her trazodone dose from 300 mg 200 mg over the next 4 weeks.

## 2012-05-22 NOTE — Assessment & Plan Note (Signed)
Chart reviewed.  Her last CT of abd and pelvis in 02/2010 showed:  Findings: There is no focal abnormality in the liver or spleen.  The stomach, duodenum, pancreas, gallbladder, and adrenal glands  have normal imaging features. The kidneys are normal.  No abdominal free fluid or lymphadenopathy. No abdominal aortic  aneurysm. Surgical clips are seen in the para-aortic space in the  abdomen.  There is no free fluid in the pelvis. The uterus is surgically  absent. No pelvic lymphadenopathy. Surgical clips are seen along  both pelvic sidewalls.  No evidence for colonic diverticulitis. No substantial  diverticulosis noted in the colon. The terminal ileum is normal.  The appendix is not visualized, but there is no edema or  inflammation in the region of the cecum.  Bone windows show age indeterminate compression deformity of the  T12 vertebral body, resulting about 25-50% loss of height  anteriorly.  IMPRESSION:  No CT findings to explain the patient's history of pain.  Age indeterminate T12 compression fracture.

## 2012-05-22 NOTE — Assessment & Plan Note (Signed)
Management by psychiatry/therapist. Continue fluoxetine 10 mg once daily. Strongly encouraged patient to decrease trazodone dose.

## 2012-05-22 NOTE — Assessment & Plan Note (Signed)
Patient reports history of mild asthma. She is currently controlled on Singulair 10 mg once daily. Consider repeat spirometry. We will further discuss at next office visit.

## 2012-05-22 NOTE — Assessment & Plan Note (Signed)
Refer to sleep specialist for followup. She is not been using her CPAP machine.

## 2012-05-23 LAB — LIPID PANEL
Cholesterol: 182 mg/dL (ref 0–200)
HDL: 43.4 mg/dL (ref >39.00)
LDL Cholesterol: 114 mg/dL — ABNORMAL HIGH (ref 0–99)
Total CHOL/HDL Ratio: 4
Triglycerides: 121 mg/dL (ref 0.0–149.0)
VLDL: 24.2 mg/dL (ref 0.0–40.0)

## 2012-05-23 LAB — BASIC METABOLIC PANEL
BUN: 18 mg/dL (ref 6–23)
CO2: 19 mEq/L (ref 19–32)
Calcium: 9.5 mg/dL (ref 8.4–10.5)
Chloride: 113 mEq/L — ABNORMAL HIGH (ref 96–112)
Creatinine, Ser: 0.9 mg/dL (ref 0.4–1.2)
GFR: 73.29 mL/min (ref >60.00)
Glucose, Bld: 85 mg/dL (ref 70–99)
Potassium: 4.2 mEq/L (ref 3.5–5.1)
Sodium: 144 mEq/L (ref 135–145)

## 2012-05-23 LAB — HIGH SENSITIVITY CRP: CRP, High Sensitivity: 2.83 mg/dL (ref 0.000–5.000)

## 2012-05-23 LAB — HEPATIC FUNCTION PANEL
ALT: 10 U/L (ref 0–35)
AST: 16 U/L (ref 0–37)
Albumin: 4.3 g/dL (ref 3.5–5.2)
Alkaline Phosphatase: 37 U/L — ABNORMAL LOW (ref 39–117)
Bilirubin, Direct: 0.1 mg/dL (ref 0.0–0.3)
Total Bilirubin: 0.4 mg/dL (ref 0.3–1.2)
Total Protein: 7.5 g/dL (ref 6.0–8.3)

## 2012-06-10 ENCOUNTER — Encounter (HOSPITAL_COMMUNITY): Payer: Self-pay | Admitting: *Deleted

## 2012-06-10 ENCOUNTER — Emergency Department (HOSPITAL_COMMUNITY)
Admission: EM | Admit: 2012-06-10 | Discharge: 2012-06-10 | Disposition: A | Discharge status: Home / Self Care | Payer: 59 | Source: Home / Self Care

## 2012-06-10 ENCOUNTER — Emergency Department (INDEPENDENT_AMBULATORY_CARE_PROVIDER_SITE_OTHER): Payer: 59

## 2012-06-10 DIAGNOSIS — S139XXA Sprain of joints and ligaments of unspecified parts of neck, initial encounter: Secondary | ICD-10-CM

## 2012-06-10 DIAGNOSIS — S161XXA Strain of muscle, fascia and tendon at neck level, initial encounter: Secondary | ICD-10-CM

## 2012-06-10 NOTE — ED Provider Notes (Signed)
History     CSN: [id number]  Arrival date & time 06/10/12  1623   None     Chief Complaint  Patient presents with  . Neck Pain    (Consider location/radiation/quality/duration/timing/severity/associated sxs/prior treatment) HPI Comments: This 51 year old female slid down a couple of stairs 3 days ago. She experienced some mild neck pain at the time but over the ensuing 2-3 days the pain in her neck is been getting worse. Pain is located in the trapezius muscles bilaterally she denies posterior cervical spine pain bilateral rotation is limited to 45 due to pain. Forward flexion limited to 30 to 2 pain in the musculature. She denies striking her head or neck. She also complains of left shoulder pain, specifically in the left trapezius. No shoulder joint pain. She is able to shrug her shoulders without difficulty. Has a history of fibromyalgia and it is difficult for her to move her arms well due to generalized muscle pain. She also has a history of lower spinal fracture from years ago. History is also positive for depression, severe anxiety, migraine headaches, and asthma.   Past Medical History  Diagnosis Date  . Asthma   . Endometrial cancer   . Depression   . History of migraine headaches     Followed by Dr. Vela Prose  . Chronic insomnia   . Osteopenia   . History of compression fracture of spine   . Obstructive sleep apnea     Past Surgical History  Procedure Date  . Abdominal hysterectomy 2003  . Breast enhancement surgery     Family History  Problem Relation Age of Onset  . Coronary artery disease Mother   . Breast cancer Maternal Grandmother   . Diabetes type II Maternal Grandfather     History  Substance Use Topics  . Smoking status: Never Smoker   . Smokeless tobacco: Not on file  . Alcohol Use: No    OB History    Grav Para Term Preterm Abortions TAB SAB Ect Mult Living                  Review of Systems  Constitutional: Negative for fever,  activity change and fatigue.  HENT: Negative.   Eyes: Negative for photophobia and pain.  Respiratory: Negative for cough, shortness of breath and wheezing.   Cardiovascular: Negative for chest pain.  Gastrointestinal: Negative.   Neurological: Positive for tremors. Negative for dizziness, seizures, syncope, facial asymmetry and speech difficulty.  Psychiatric/Behavioral: Positive for disturbed wake/sleep cycle. Negative for confusion. The patient is nervous/anxious.     Allergies  Review of patient's allergies indicates no known allergies.  Home Medications   Current Outpatient Rx  Name Route Sig Dispense Refill  . ALBUTEROL SULFATE HFA 108 (90 BASE) MCG/ACT IN AERS Inhalation Inhale 2 puffs into the lungs every 6 (six) hours as needed.    . ALPRAZOLAM 0.25 MG PO TABS Oral Take 0.25 mg by mouth at bedtime as needed.    [name] CETIRIZINE HCL 10 MG PO TABS Oral Take 10 mg by mouth daily.    . CYCLOBENZAPRINE HCL 10 MG PO TABS Oral Take 10 mg by mouth daily.    [name] FLUOXETINE HCL 10 MG PO TABS Oral Take 10 mg by mouth daily.    [name] HYDROMORPHONE HCL 3 MG RE SUPP Rectal Place 3 mg rectally every 6 (six) hours as needed.    [name] LIDOCAINE 5 % EX PTCH Transdermal Place 1 patch onto the skin daily. Remove & Discard patch  within 12 hours or as directed by MD    . MONTELUKAST SODIUM 10 MG PO TABS Oral Take 10 mg by mouth at bedtime.    [name] ONDANSETRON HCL 8 MG PO TABS Oral Take 8 mg by mouth every 8 (eight) hours as needed.    [name] RIZATRIPTAN BENZOATE 10 MG PO TABS Oral Take 10 mg by mouth as needed. May repeat in 2 hours if needed    . TOPIRAMATE 100 MG PO TABS Oral Take 100 mg by mouth 2 (two) times daily.    . TRAZODONE HCL 100 MG PO TABS Oral Take 100 mg by mouth at bedtime.    [name] ZOLPIDEM TARTRATE 10 MG PO TABS Oral Take 10 mg by mouth at bedtime as needed.      BP 100/64  Pulse 81  Temp 98 F (36.7 C) (Oral)  Resp 18  SpO2 99%  Physical Exam  Constitutional: She is oriented to person, place, and  time. She appears well-developed and well-nourished. No distress.  HENT:  Head: Normocephalic and atraumatic.  Eyes: EOM are normal. Pupils are equal, round, and reactive to light.  Neck:       See history of present illness. Somewhat limited range of motion of the neck didn't to muscle pain as described above. No spinal tenderness. Positive tenderness in the  bilateral trapezii. No tenderness in the splenius capitis muscles  Cardiovascular: Normal rate and normal heart sounds.   Pulmonary/Chest: Effort normal and breath sounds normal. No respiratory distress.  Musculoskeletal: She exhibits tenderness.  Neurological: She is alert and oriented to person, place, and time. No cranial nerve deficit.  Skin: Skin is warm and dry.    ED Course  Procedures (including critical care time)  Labs Reviewed - No data to display Dg Cervical Spine Complete  06/10/2012  *RADIOLOGY REPORT*  Clinical Data: Neck pain secondary to a fall on 06/07/2012  CERVICAL SPINE - COMPLETE 4+ VIEW  Comparison: None.  Findings: There is no fracture, subluxation, disc space narrowing, facet arthritis, soft tissue swelling, or other abnormality.  IMPRESSION: Normal cervical spine.   Original Report Authenticated By: Gwynn Burly, M.D.      1. Posterolateral cervical muscle strain       MDM  C-spine negative. Continue meds you have at home. We'll apply a soft c-collar here and recommend heat applications several times during the day.        Hayden Rasmussen, NP 06/10/12 1925

## 2012-06-10 NOTE — ED Notes (Signed)
Pt fell down 2 stairs at home 3 days ago.  Today she c/o posterior neck pain and  left shoulder pain

## 2012-06-10 NOTE — ED Provider Notes (Signed)
Medical screening examination/treatment/procedure(s) were performed by resident physician or non-physician practitioner and as supervising physician I was immediately available for consultation/collaboration.   Casten Floren DOUGLAS MD.    Valory Wetherby D Lavera Vandermeer, MD 06/10/12 2116 

## 2012-06-18 ENCOUNTER — Ambulatory Visit (INDEPENDENT_AMBULATORY_CARE_PROVIDER_SITE_OTHER): Payer: 59 | Admitting: Pulmonary Disease

## 2012-06-18 ENCOUNTER — Encounter: Payer: Self-pay | Admitting: Pulmonary Disease

## 2012-06-18 VITALS — BP 104/82 | HR 68 | Temp 97.7°F | Ht 63.0 in | Wt 161.4 lb

## 2012-06-18 DIAGNOSIS — G2581 Restless legs syndrome: Secondary | ICD-10-CM

## 2012-06-18 DIAGNOSIS — G4733 Obstructive sleep apnea (adult) (pediatric): Secondary | ICD-10-CM

## 2012-06-18 MED ORDER — ROPINIROLE HCL 0.5 MG PO TABS: ORAL_TABLET | ORAL | Status: DC

## 2012-06-18 NOTE — Progress Notes (Signed)
Subjective:    Patient ID: Kathryn [name], female    DOB: 12/03/1960, 51 y.o.   MRN: [id number]  HPI The patient is a 51 year old female who I have been asked to see for a history of sleep apnea.  She was diagnosed in 2006 with mild OSA, with an AHI of 16 events per hour.  Of note, she was also found to have large numbers of periodic limb movements with significant sleep disruption.  The patient was started on CPAP, but had great difficulty with tolerance from the very start.  She tried this for 4 weeks, and "gave up".  She hasn't tried again in over a year.  Currently, the patient is unsure if she snores, and no one has commented on an abnormal breathing pattern during sleep.  She does not feel rested in the mornings upon arising, but primarily describes fatigue being much more significant than actual sleepiness.  She does have some sleepiness with inactivity during the day.  The patient has no difficulty watching movies in the evenings or working on a computer because of sleepiness.  She has noted sleepiness issues with driving.  She describes some RLS symptoms, but she does feel that she kicks during the night.  The patient has lost 40 pounds since December of last year, and her Epworth score today is 17 if she doesn't take her sleep medication.    Sleep Questionnaire: What time do you typically go to bed?( Between what hours) VARIES How long does it take you to fall asleep? VARIES How many times during the night do you wake up? What time do you get out of bed to start your day? 1100 Do you drive or operate heavy machinery in your occupation? No How much has your weight changed (up or down) over the past two years? (In pounds) 40 lb (18.144 kg) Have you ever had a sleep study before? Yes If yes, location of study? [facility] If yes, date of study? 2005 ? Do you currently use CPAP? No Do you wear oxygen at any time? No    Review of Systems  Constitutional: Positive for unexpected weight change. Negative  for fever.  HENT: Negative for ear pain, nosebleeds, congestion, sore throat, rhinorrhea, sneezing, trouble swallowing, dental problem, postnasal drip and sinus pressure.   Eyes: Negative for redness and itching.  Respiratory: Positive for cough, choking and shortness of breath. Negative for chest tightness and wheezing.   Cardiovascular: Negative for palpitations and leg swelling.  Gastrointestinal: Negative for nausea and vomiting.  Genitourinary: Negative for dysuria.  Musculoskeletal: Positive for joint swelling.  Skin: Negative for rash.  Neurological: Positive for headaches.  Hematological: Does not bruise/bleed easily.  Psychiatric/Behavioral: Positive for dysphoric mood. The patient is nervous/anxious.        Objective:   Physical Exam Constitutional:  Well developed, no acute distress  HENT:  Nares patent without discharge  Oropharynx without exudate, palate and uvula are normal  Eyes:  Perrla, eomi, no scleral icterus  Neck:  No JVD, no TMG  Cardiovascular:  Normal rate, regular rhythm, no rubs or gallops.  No murmurs        Intact distal pulses  Pulmonary :  Normal breath sounds, no stridor or respiratory distress   No rales, rhonchi, or wheezing  Abdominal:  Soft, nondistended, bowel sounds present.  No tenderness noted.   Musculoskeletal:  mild lower extremity edema noted.  Lymph Nodes:  No cervical lymphadenopathy noted  Skin:  No cyanosis noted  Neurologic:  Alert, appropriate, moves all 4 extremities without obvious deficit.         Assessment & Plan:

## 2012-06-18 NOTE — Assessment & Plan Note (Signed)
The patient has abnormal leg movements on her prior sleep study, and her symptoms today suggest RLS.  Will try her on a dopamine agonist.

## 2012-06-18 NOTE — Patient Instructions (Addendum)
Will start on requip 0.5mg  one each night after dinner.  If no better or only partially better, can increase to 2 after dinner. Will hold off on treatment of sleep apnea until we can sort this out. Please call me in 3-4 weeks to give update on how things are going.

## 2012-06-18 NOTE — Assessment & Plan Note (Signed)
The patient has a history of mild obstructive sleep apnea, but had extremely poor CPAP tolerance.  She has now lost 40 pounds, and it is unclear if she even has an issue with sleep disordered breathing.  Because her limb movements appeared to be so significant on her prior study, and these are currently not being addressed, I would like to retreat with a dopamine agonist prior to considering further treatment for possible sleep apnea.

## 2012-06-19 ENCOUNTER — Encounter: Payer: Self-pay | Admitting: Internal Medicine

## 2012-06-19 ENCOUNTER — Ambulatory Visit (INDEPENDENT_AMBULATORY_CARE_PROVIDER_SITE_OTHER): Payer: 59 | Admitting: Internal Medicine

## 2012-06-19 VITALS — BP 114/80 | HR 76 | Temp 97.7°F | Wt 160.0 lb

## 2012-06-19 DIAGNOSIS — G4733 Obstructive sleep apnea (adult) (pediatric): Secondary | ICD-10-CM

## 2012-06-19 DIAGNOSIS — M8580 Other specified disorders of bone density and structure, unspecified site: Secondary | ICD-10-CM

## 2012-06-19 DIAGNOSIS — J45909 Unspecified asthma, uncomplicated: Secondary | ICD-10-CM

## 2012-06-19 DIAGNOSIS — M899 Disorder of bone, unspecified: Secondary | ICD-10-CM

## 2012-06-19 MED ORDER — BECLOMETHASONE DIPROPIONATE 40 MCG/ACT IN AERS: 2.0000 | INHALATION_SPRAY | Freq: Two times a day (BID) | RESPIRATORY_TRACT | Status: DC

## 2012-06-19 NOTE — Patient Instructions (Signed)
I suggest you follow Mediterranean diet.

## 2012-06-19 NOTE — Assessment & Plan Note (Signed)
Patient started on dopamine agonist. Follow up with sleep specialist regarding response.

## 2012-06-19 NOTE — Assessment & Plan Note (Signed)
Her symptoms are not completely controlled on Singulair. Switch to Qvar.  Reassess in 2 months.  Spirometry showed mild obstructive disease. It also showed small airway disease.

## 2012-06-19 NOTE — Progress Notes (Signed)
Subjective:    Patient ID: Kathryn [name], female    DOB: Jun 03, 1961, 51 y.o.   MRN: [id number]  HPI  51 year old white female for followup regarding enuresis and objective sleep apnea. She was seen by Dr. Shelle Iron. Her previous sleep study was reviewed. Patient apparently had mild sleep apnea but numerous leg movements during previous sleep study. Patient was started on dopamine agonist.   History of asthma-she was previously on inhaled corticosteroid however she discontinued secondary to concerns of osteopenia since she had compression fracture of her lumbar spine after accidental fall. I reviewed her previous DEXA scan from Oct 2011. She has osteopenia of lumbar spine. Patient is using her rescue inhaler at least 4-5 times per month. She reports more frequent coughing attacks.    Review of Systems Negative for shortness of breath, chronic anxiety  Past Medical History  Diagnosis Date  . Asthma   . Endometrial cancer   . Depression   . History of migraine headaches     Followed by Dr. Vela Prose  . Chronic insomnia   . Osteopenia   . History of compression fracture of spine   . Obstructive sleep apnea     History   Social History  . Marital Status: Married    Spouse Name: N/A    Number of Children: 2  . Years of Education: N/A   Occupational History  .     Social History Main Topics  . Smoking status: Never Smoker   . Smokeless tobacco: Never Used   Comment: HUSBAND SMOKES   . Alcohol Use: No  . Drug Use: No  . Sexually Active: Yes    Birth Control/ Protection: Surgical   Other Topics Concern  . 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 Abdominal hysterectomy 2003  . Breast enhancement surgery     Family History  Problem Relation Age of Onset  . Coronary artery disease Mother   . Breast cancer Maternal Grandmother   . Diabetes type II  Maternal Grandfather   . Heart failure Mother   . Asthma Mother     No Known Allergies  Current Outpatient Prescriptions on File Prior to Visit  Medication Sig Dispense Refill  . albuterol (VENTOLIN HFA) 108 (90 BASE) MCG/ACT inhaler Inhale 2 puffs into the lungs every 6 (six) hours as needed.      . ALPRAZolam (XANAX) 0.25 MG tablet Take 0.25 mg by mouth at bedtime as needed.      . cetirizine (ZYRTEC) 10 MG tablet Take 10 mg by mouth daily.      . cyclobenzaprine (FLEXERIL) 10 MG tablet Take 10 mg by mouth daily.      [name] FLUoxetine (PROZAC) 10 MG tablet Take 10 mg by mouth daily.      [name] HYDROmorphone (DILAUDID) 3 MG suppository Place 3 mg rectally every 6 (six) hours as needed.      . lidocaine (LIDODERM) 5 % Place 1 patch onto the skin daily. Remove & Discard patch within 12 hours or as directed by MD      . ondansetron (ZOFRAN) 8 MG tablet Take 8 mg by mouth every 8 (eight) hours as needed.      . rizatriptan (MAXALT) 10 MG tablet Take 10 mg by mouth as needed. May repeat in 2 hours if needed      .  rOPINIRole (REQUIP) 0.5 MG tablet Take 1-2 tablets after dinner daily.  60 tablet  0  . topiramate (TOPAMAX) 100 MG tablet Take 100 mg by mouth 2 (two) times daily.      . traZODone (DESYREL) 100 MG tablet Take 100 mg by mouth at bedtime.      [name] zolpidem (AMBIEN) 10 MG tablet Take 10 mg by mouth at bedtime as needed.      . beclomethasone (QVAR) 40 MCG/ACT inhaler Inhale 2 puffs into the lungs 2 (two) times daily.  1 Inhaler  5    BP 114/80  Pulse 76  Temp 97.7 F (36.5 C) (Oral)  Wt 160 lb (72.576 kg)  Spirometry shows a mild obstructive disease. She has also small airway disease.     Objective:   Physical Exam  Constitutional: She is oriented to person, place, and time. She appears well-developed and well-nourished.  HENT:  Head: Normocephalic and atraumatic.  Cardiovascular: Normal rate, regular rhythm and normal heart sounds.   No murmur heard. Pulmonary/Chest: Effort  normal and breath sounds normal. She has no wheezes.  Musculoskeletal: She exhibits no edema.  Neurological: She is alert and oriented to person, place, and time.  Skin: Skin is warm and dry.  Psychiatric: She has a normal mood and affect. Her behavior is normal.          Assessment & Plan:

## 2012-06-19 NOTE — Assessment & Plan Note (Signed)
Repeat bone density scan. Patient advised to take at least 2000 units of vitamin D 3 once daily.

## 2012-06-20 ENCOUNTER — Other Ambulatory Visit: Payer: Self-pay | Admitting: Internal Medicine

## 2012-06-20 DIAGNOSIS — Z1231 Encounter for screening mammogram for malignant neoplasm of breast: Secondary | ICD-10-CM

## 2012-07-09 ENCOUNTER — Ambulatory Visit (HOSPITAL_COMMUNITY)
Admission: RE | Admit: 2012-07-09 | Discharge: 2012-07-09 | Disposition: A | Discharge status: Home / Self Care | Payer: 59 | Source: Ambulatory Visit | Attending: Internal Medicine | Admitting: Internal Medicine

## 2012-07-09 DIAGNOSIS — Z1231 Encounter for screening mammogram for malignant neoplasm of breast: Secondary | ICD-10-CM

## 2012-07-09 DIAGNOSIS — M8580 Other specified disorders of bone density and structure, unspecified site: Secondary | ICD-10-CM

## 2012-07-09 DIAGNOSIS — Z13820 Encounter for screening for osteoporosis: Secondary | ICD-10-CM | POA: Insufficient documentation

## 2012-07-09 DIAGNOSIS — Z780 Asymptomatic menopausal state: Secondary | ICD-10-CM | POA: Insufficient documentation

## 2012-07-11 ENCOUNTER — Other Ambulatory Visit: Payer: Self-pay | Admitting: *Deleted

## 2012-07-11 ENCOUNTER — Telehealth: Payer: Self-pay | Admitting: Internal Medicine

## 2012-07-11 MED ORDER — ALBUTEROL SULFATE HFA 108 (90 BASE) MCG/ACT IN AERS: 2.0000 | INHALATION_SPRAY | Freq: Four times a day (QID) | RESPIRATORY_TRACT | Status: DC | PRN

## 2012-07-11 NOTE — Telephone Encounter (Signed)
Pt called and said that she tried calling [facility] and could not get a nurse. Pt said that the beclomethasone (QVAR) 40 MCG/ACT inhaler in not working. Req stronger dose. Pt also needs a rescue inhaler to use prn.  Also pt said that the [facility] is very difficult to use and is wondering if the is anything simpler to use? Pls call in to [employer].

## 2012-07-11 NOTE — Telephone Encounter (Signed)
Please have pt pick up samples of symbicort 160/4.5 - 2 puffs bid.  Please clarify colocare?

## 2012-07-11 NOTE — Telephone Encounter (Signed)
Pt is going to call [facility] about the [facility]ocare.  Samples are up front for her to p/u

## 2012-07-11 NOTE — Telephone Encounter (Signed)
Left message for pt to call back  °

## 2012-07-16 ENCOUNTER — Telehealth: Payer: Self-pay | Admitting: Internal Medicine

## 2012-07-16 NOTE — Telephone Encounter (Signed)
Kathryn [name], pt needs Prolia injections.  Could get this authorized?

## 2012-07-16 NOTE — Telephone Encounter (Signed)
Pt called and said that she is returning call from yesterday. Pls call.

## 2012-07-17 NOTE — Telephone Encounter (Signed)
Has been submitted - will let you know. Thanks!

## 2012-07-23 NOTE — Telephone Encounter (Signed)
Kathryn Asp, [name] does not need prior auth for Prolia injections. Per her insurance: once her plan deductible is met ($400 - I think it is met, she should know), then her portion is 35%. However, a lot of times, insurances pay for the whole thing. A Prolia injection is $1842.00. Thanks.

## 2012-07-24 NOTE — Telephone Encounter (Signed)
Talked with pt and gave her the figures-pt is going to call insurance  herself and see exactly how much it will be

## 2012-07-26 ENCOUNTER — Other Ambulatory Visit: Payer: Self-pay | Admitting: Internal Medicine

## 2012-07-26 MED ORDER — ROPINIROLE HCL 0.5 MG PO TABS: ORAL_TABLET | ORAL | Status: DC

## 2012-07-26 NOTE — Telephone Encounter (Signed)
rx sent in electronically 

## 2012-07-26 NOTE — Telephone Encounter (Signed)
Pt would like refill on ropinirole 0.5mg  . Pt takes two pills after dinner daily requesting #[phone] with 3 refills call into [facility] outpatient pharm. Dr Shelle Iron  Prescribed med originally. Pt is requesting Dr Artist Pais to take over.

## 2012-08-07 ENCOUNTER — Ambulatory Visit (INDEPENDENT_AMBULATORY_CARE_PROVIDER_SITE_OTHER): Payer: 59 | Admitting: Internal Medicine

## 2012-08-07 DIAGNOSIS — M810 Age-related osteoporosis without current pathological fracture: Secondary | ICD-10-CM

## 2012-08-07 MED ORDER — DENOSUMAB 60 MG/ML ~~LOC~~ SOLN
60.0000 mg | Freq: Once | SUBCUTANEOUS | Status: AC
  Administered 2012-08-07: 60 mg via SUBCUTANEOUS

## 2012-08-19 ENCOUNTER — Ambulatory Visit (INDEPENDENT_AMBULATORY_CARE_PROVIDER_SITE_OTHER): Payer: 59 | Admitting: Internal Medicine

## 2012-08-19 ENCOUNTER — Encounter: Payer: Self-pay | Admitting: Internal Medicine

## 2012-08-19 VITALS — BP 104/72 | HR 66 | Temp 98.4°F | Wt 160.0 lb

## 2012-08-19 DIAGNOSIS — M810 Age-related osteoporosis without current pathological fracture: Secondary | ICD-10-CM

## 2012-08-19 DIAGNOSIS — J45909 Unspecified asthma, uncomplicated: Secondary | ICD-10-CM

## 2012-08-19 DIAGNOSIS — G2581 Restless legs syndrome: Secondary | ICD-10-CM

## 2012-08-19 MED ORDER — BUDESONIDE-FORMOTEROL FUMARATE 160-4.5 MCG/ACT IN AERO: 2.0000 | INHALATION_SPRAY | Freq: Two times a day (BID) | RESPIRATORY_TRACT | Status: DC

## 2012-08-19 MED ORDER — ROPINIROLE HCL 1 MG PO TABS: 1.0000 mg | ORAL_TABLET | Freq: Every day | ORAL | Status: DC

## 2012-08-19 NOTE — Assessment & Plan Note (Signed)
Improved with requip 1 mg.  Continue same dose

## 2012-08-19 NOTE — Assessment & Plan Note (Signed)
Patient's bone density scan 07/09/2012 showed osteoporosis of lumbar spine with T score of -2.5. She has history of thoracic compression fracture. Patient started on Prolia.  She is tolerating well.  Continue q 6 month injections.

## 2012-08-19 NOTE — Assessment & Plan Note (Signed)
Patient was still experiencing coughing  spells on Qvar. She was switched to Symbicort with improvement in her symptoms.

## 2012-08-19 NOTE — Progress Notes (Signed)
Subjective:    Patient ID: Kathryn [name], female    DOB: [birth year], 51 y.o.   MRN: [id number]  HPI  51 year old white female with history of asthma, obstructive sleep apnea and restless leg syndrome for followup. Patient's dopamine agonists was titrated upward to 1 mg. Patient reports her sleep quality has significantly improved.  Asthma-she was previously on Qvar but still having coughing spells. Since switching to Symbicort her symptoms have significantly improved.  Osteoporosis-her bone density scan confirmed osteoporosis in her spine. She was started on Prolia injections. She denies any adverse effects.  Health maintenance-patient reports she had colonoscopy at age 23. This was completed by gastroenterologist at [facility]. She reports her colonoscopy was normal.  Review of Systems Negative for cough or shortness of breath  Past Medical History  Diagnosis Date  . Asthma   . Endometrial cancer   . Depression   . History of migraine headaches     Followed by Dr. Vela Prose  . Chronic insomnia   . Osteopenia   . History of compression fracture of spine   . Obstructive sleep apnea     History   Social History  . Marital Status: Married    Spouse Name: N/A    Number of Children: 2  . Years of Education: N/A   Occupational History  .     Social History Main Topics  . Smoking status: Never Smoker   . Smokeless tobacco: Never Used     Comment: HUSBAND SMOKES   . Alcohol Use: No  . Drug Use: No  . Sexually Active: Yes    Birth Control/ Protection: Surgical   Other Topics Concern  . 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 Abdominal hysterectomy 2003  . Breast enhancement surgery     Family History  Problem Relation Age of Onset  . Coronary artery disease Mother   . Breast cancer Maternal Grandmother   . Diabetes type II Maternal  Grandfather   . Heart failure Mother   . Asthma Mother     No Known Allergies  Current Outpatient Prescriptions on File Prior to Visit  Medication Sig Dispense Refill  . albuterol (VENTOLIN HFA) 108 (90 BASE) MCG/ACT inhaler Inhale 2 puffs into the lungs every 6 (six) hours as needed.  8.5 g  2  . ALPRAZolam (XANAX) 0.25 MG tablet Take 0.25 mg by mouth at bedtime as needed.      . budesonide-formoterol (SYMBICORT) 160-4.5 MCG/ACT inhaler Inhale 2 puffs into the lungs 2 (two) times daily.  3 Inhaler  1  . cetirizine (ZYRTEC) 10 MG tablet Take 10 mg by mouth daily.      . cyclobenzaprine (FLEXERIL) 10 MG tablet Take 10 mg by mouth daily.      [name] FLUoxetine (PROZAC) 10 MG tablet Take 10 mg by mouth daily as needed.       [name] HYDROmorphone (DILAUDID) 3 MG suppository Place 3 mg rectally every 6 (six) hours as needed.      . lidocaine (LIDODERM) 5 % Place 1 patch onto the skin daily. Remove & Discard patch within 12 hours or as directed by MD      . ondansetron (ZOFRAN) 8 MG tablet Take 8 mg by mouth every 8 (eight) hours as needed.      . rizatriptan (MAXALT) 10 MG tablet Take 10  mg by mouth as needed. May repeat in 2 hours if needed      . topiramate (TOPAMAX) 100 MG tablet 1 tablet in the morning and 2 tablets in the evening      . traZODone (DESYREL) 100 MG tablet Take 200 mg by mouth at bedtime as needed.       . zolpidem (AMBIEN) 10 MG tablet Take 10 mg by mouth at bedtime as needed.      . [DISCONTINUED] rOPINIRole (REQUIP) 0.5 MG tablet Take 1-2 tablets after dinner daily.  180 tablet  1    BP 104/72  Pulse 66  Temp 98.4 F (36.9 C) (Oral)  Wt 160 lb (72.576 kg)       Objective:   Physical Exam  Constitutional: She is oriented to person, place, and time. She appears well-developed and well-nourished.  Cardiovascular: Normal rate, regular rhythm and normal heart sounds.   Pulmonary/Chest: Effort normal and breath sounds normal. She has no wheezes.  Musculoskeletal: She exhibits  no edema.  Neurological: She is alert and oriented to person, place, and time. No cranial nerve deficit.  Skin: Skin is warm and dry.  Psychiatric: She has a normal mood and affect. Her behavior is normal.          Assessment & Plan:

## 2013-02-07 ENCOUNTER — Telehealth: Payer: Self-pay | Admitting: Internal Medicine

## 2013-02-07 ENCOUNTER — Other Ambulatory Visit: Payer: Self-pay | Admitting: Internal Medicine

## 2013-02-07 NOTE — Telephone Encounter (Signed)
Holly, do we have to get this preapproved each time?

## 2013-02-07 NOTE — Telephone Encounter (Signed)
It really depends, so it's always good to let me look at it. Some insurances require prior auth, some don't. Hers does not. What it will cost her each time depends on her deductible. She has a $400 out of pocket deductible. Last Prolia was Nov 2103 - & she had met her deductible already. Now it's 2014 & I don't know if she's met it or not. So she may have to pay up to $400. She can check on her deductible status w/her ins company. But they DO cover the service w/ not prior auth. Does that help?

## 2013-02-07 NOTE — Telephone Encounter (Signed)
Caller: Asyia/Patient; Phone: [phone]; Reason for Call: Pt calling today 02/07/13 regarding her Prolia injection, she is supposed to get these every 6 months and is supposed to call office first to get pre-approved.  PLEASE CALL PT BACK AT [phone] TO LET HER KNOW WHEN SHE CAN HAVE THIS.  Thanks.

## 2013-02-07 NOTE — Telephone Encounter (Signed)
Pt has not met deductible.  She has an appt on 02/17/13 and pt states she will get the prolia injection while she is here

## 2013-02-11 ENCOUNTER — Ambulatory Visit (INDEPENDENT_AMBULATORY_CARE_PROVIDER_SITE_OTHER): Payer: 59 | Admitting: Internal Medicine

## 2013-02-11 ENCOUNTER — Encounter: Payer: Self-pay | Admitting: Internal Medicine

## 2013-02-11 VITALS — BP 90/60 | HR 72 | Temp 98.1°F | Resp 16 | Ht 63.0 in | Wt 160.0 lb

## 2013-02-11 DIAGNOSIS — T148XXA Other injury of unspecified body region, initial encounter: Secondary | ICD-10-CM | POA: Insufficient documentation

## 2013-02-11 DIAGNOSIS — I959 Hypotension, unspecified: Secondary | ICD-10-CM

## 2013-02-11 DIAGNOSIS — M810 Age-related osteoporosis without current pathological fracture: Secondary | ICD-10-CM

## 2013-02-11 MED ORDER — DENOSUMAB 60 MG/ML ~~LOC~~ SOLN
60.0000 mg | Freq: Once | SUBCUTANEOUS | Status: AC
  Administered 2013-02-11: 60 mg via SUBCUTANEOUS

## 2013-02-11 NOTE — Progress Notes (Signed)
Subjective:    Patient ID: Kathryn [name], female    DOB: 05/11/1961, 52 y.o.   MRN: [id number]  HPI  52 year old white female with history of asthma, restless leg syndrome and chronic insomnia for followup. Patient complains she is experiencing intermittent low blood pressure readings. This has occurred over last several days. Lowest blood pressure was 83/56.  She also complains of easy bruising. Mild bruising mostly in her lower legs.  She also has noticed mild tremor in her hands.   Review of Systems Patient denies any abnormal bleeding, her medication changes were reviewed in detail    Past Medical History  Diagnosis Date  . Asthma   . Endometrial cancer   . Depression   . History of migraine headaches     Followed by Dr. Vela Prose  . Chronic insomnia   . Osteopenia   . History of compression fracture of spine   . Obstructive sleep apnea     History   Social History  . Marital Status: Married    Spouse Name: N/A    Number of Children: 2  . Years of Education: N/A   Occupational History  .     Social History Main Topics  . Smoking status: Never Smoker   . Smokeless tobacco: Never Used     Comment: HUSBAND SMOKES   . Alcohol Use: No  . Drug Use: No  . Sexually Active: Yes    Birth Control/ Protection: Surgical   Other Topics Concern  . Not on file   Social History Narrative   Married 21 years   She has 2 sons ages 90 and 77   She works as travel Research scientist (medical)    Past Surgical History  Procedure Laterality Date  . Abdominal hysterectomy  2003  . Breast enhancement surgery      Family History  Problem Relation Age of Onset  . Coronary artery disease Mother   . Breast cancer Maternal Grandmother   . Diabetes type II Maternal Grandfather   . Heart failure Mother   . Asthma Mother     No Known Allergies  Current Outpatient Prescriptions on File Prior to Visit  Medication Sig Dispense Refill  . albuterol (VENTOLIN HFA) 108 (90 BASE) MCG/ACT  inhaler Inhale 2 puffs into the lungs every 6 (six) hours as needed.  8.5 g  2  . ALPRAZolam (XANAX) 0.25 MG tablet Take 0.25 mg by mouth at bedtime as needed.      . budesonide-formoterol (SYMBICORT) 160-4.5 MCG/ACT inhaler Inhale 2 puffs into the lungs 2 (two) times daily.  3 Inhaler  1  . cetirizine (ZYRTEC) 10 MG tablet Take 10 mg by mouth daily.      . cyclobenzaprine (FLEXERIL) 10 MG tablet Take 10 mg by mouth daily as needed.       [name] FLUoxetine (PROZAC) 10 MG tablet Take 40 mg by mouth daily as needed.       [name] HYDROmorphone (DILAUDID) 3 MG suppository Place 3 mg rectally every 6 (six) hours as needed.      . lidocaine (LIDODERM) 5 % Place 1 patch onto the skin daily. Remove & Discard patch within 12 hours or as directed by MD      . ondansetron (ZOFRAN) 8 MG tablet Take 8 mg by mouth every 8 (eight) hours as needed.      . rizatriptan (MAXALT) 10 MG tablet Take 10 mg by mouth as needed. May repeat in 2 hours if needed      .  rOPINIRole (REQUIP) 1 MG tablet TAKE 1 TABLET BY MOUTH ONCE DAILY  90 tablet  1  . topiramate (TOPAMAX) 100 MG tablet 200 mg daily. 1 tablet in the morning and 2 tablets in the evening      . zolpidem (AMBIEN) 10 MG tablet Take 10 mg by mouth.        No current facility-administered medications on file prior to visit.    BP 90/60  Pulse 72  Temp(Src) 98.1 F (36.7 C)  Resp 16  Ht 5\' 3"  (1.6 m)  Wt 160 lb (72.576 kg)  BMI 28.35 kg/m2    Objective:   Physical Exam  Constitutional: She is oriented to person, place, and time. She appears well-developed and well-nourished. No distress.  HENT:  Head: Normocephalic and atraumatic.  Right Ear: External ear normal.  Left Ear: External ear normal.  Eyes: Conjunctivae and EOM are normal. Pupils are equal, round, and reactive to light.  Neck: Neck supple.  Cardiovascular: Normal rate, regular rhythm and normal heart sounds.  Exam reveals no friction rub.   No murmur heard. Pulmonary/Chest: Effort normal. She has  no wheezes.  Musculoskeletal: She exhibits no edema.  Lymphadenopathy:    She has no cervical adenopathy.  Neurological: She is alert and oriented to person, place, and time. No cranial nerve deficit.  Slight resting tremor  Skin: Skin is warm and dry. No rash noted.  Psychiatric: She has a normal mood and affect. Her behavior is normal.          Assessment & Plan:

## 2013-02-11 NOTE — Assessment & Plan Note (Signed)
Patient has mild bruising in her lower extremities. No history of abnormal bleeding. Her symptoms may be secondary to platelet dysfunction from fluoxetine. Check CBC to rule out, thrombocytopenia and INR to rule out coagulopathy.

## 2013-02-11 NOTE — Assessment & Plan Note (Signed)
Continue Prolia injections every 6 months

## 2013-02-11 NOTE — Patient Instructions (Signed)
Please increase your salt and fluid intake Follow up with your psychiatrist re: changing fluoxetine to sertraline or citalopram

## 2013-02-11 NOTE — Assessment & Plan Note (Addendum)
Patient experiencing hypotension. She is not orthostatic.[name] Blood pressure is 110/70 standing. I suspect her hypotension secondary to medication side effect. Patient advised to decrease her trazodone to 100 mg. Since previous visit patient's fluoxetine increased to 40 mg. We discussed possibility of high risk for drug interactions with fluoxetine. She'll follow up with her psychiatrist to discuss changing SSRI to either sertraline or citalopram. Patient also advised to increase salt and fluid intake. Reassess in one month. If persistent symptoms, consider further workup for adrenal insufficiency.  Check CBC, electrolytes, kidney function and thyroid function tests BP: 90/60 mmHg

## 2013-02-12 LAB — CBC WITH DIFFERENTIAL/PLATELET
Basophils Absolute: 0.1 10*3/uL (ref 0.0–0.1)
Basophils Relative: 1.4 % (ref 0.0–3.0)
Eosinophils Absolute: 0.1 10*3/uL (ref 0.0–0.7)
Eosinophils Relative: 2.3 % (ref 0.0–5.0)
HCT: 35.1 % — ABNORMAL LOW (ref 36.0–46.0)
Hemoglobin: 11.8 g/dL — ABNORMAL LOW (ref 12.0–15.0)
Lymphocytes Relative: 28.1 % (ref 12.0–46.0)
Lymphs Abs: 1.4 10*3/uL (ref 0.7–4.0)
MCHC: 33.7 g/dL (ref 30.0–36.0)
MCV: 96.6 fl (ref 78.0–100.0)
Monocytes Absolute: 0.3 10*3/uL (ref 0.1–1.0)
Monocytes Relative: 6.8 % (ref 3.0–12.0)
Neutro Abs: 3.1 10*3/uL (ref 1.4–7.7)
Neutrophils Relative %: 61.4 % (ref 43.0–77.0)
Platelets: 213 10*3/uL (ref 150.0–400.0)
RBC: 3.63 Mil/uL — ABNORMAL LOW (ref 3.87–5.11)
RDW: 13.7 % (ref 11.5–14.6)
WBC: 5 10*3/uL (ref 4.5–10.5)

## 2013-02-12 LAB — BASIC METABOLIC PANEL
BUN: 19 mg/dL (ref 6–23)
CO2: 22 mEq/L (ref 19–32)
Calcium: 9.3 mg/dL (ref 8.4–10.5)
Chloride: 112 mEq/L (ref 96–112)
Creatinine, Ser: 0.8 mg/dL (ref 0.4–1.2)
GFR: 80.5 mL/min (ref >60.00)
Glucose, Bld: 84 mg/dL (ref 70–99)
Potassium: 3.8 mEq/L (ref 3.5–5.1)
Sodium: 141 mEq/L (ref 135–145)

## 2013-02-12 LAB — PROTIME-INR
INR: 1.1 ratio — ABNORMAL HIGH (ref 0.8–1.0)
Prothrombin Time: 11.6 s (ref 10.2–12.4)

## 2013-02-12 LAB — TSH: TSH: 0.47 u[IU]/mL (ref 0.35–5.50)

## 2013-02-12 LAB — T4, FREE: Free T4: 0.78 ng/dL (ref 0.60–1.60)

## 2013-02-15 ENCOUNTER — Emergency Department (HOSPITAL_COMMUNITY)
Admission: EM | Admit: 2013-02-15 | Discharge: 2013-02-16 | Disposition: A | Discharge status: Home / Self Care | Payer: 59 | Attending: Emergency Medicine | Admitting: Emergency Medicine

## 2013-02-15 ENCOUNTER — Other Ambulatory Visit: Payer: Self-pay

## 2013-02-15 ENCOUNTER — Emergency Department (HOSPITAL_COMMUNITY): Payer: 59

## 2013-02-15 ENCOUNTER — Encounter (HOSPITAL_COMMUNITY): Payer: Self-pay | Admitting: *Deleted

## 2013-02-15 DIAGNOSIS — Z79899 Other long term (current) drug therapy: Secondary | ICD-10-CM | POA: Insufficient documentation

## 2013-02-15 DIAGNOSIS — R0602 Shortness of breath: Secondary | ICD-10-CM | POA: Insufficient documentation

## 2013-02-15 DIAGNOSIS — R079 Chest pain, unspecified: Secondary | ICD-10-CM | POA: Insufficient documentation

## 2013-02-15 DIAGNOSIS — G43909 Migraine, unspecified, not intractable, without status migrainosus: Secondary | ICD-10-CM | POA: Insufficient documentation

## 2013-02-15 DIAGNOSIS — Z8542 Personal history of malignant neoplasm of other parts of uterus: Secondary | ICD-10-CM | POA: Insufficient documentation

## 2013-02-15 DIAGNOSIS — Z8781 Personal history of (healed) traumatic fracture: Secondary | ICD-10-CM | POA: Insufficient documentation

## 2013-02-15 DIAGNOSIS — Z8739 Personal history of other diseases of the musculoskeletal system and connective tissue: Secondary | ICD-10-CM | POA: Insufficient documentation

## 2013-02-15 DIAGNOSIS — F3289 Other specified depressive episodes: Secondary | ICD-10-CM | POA: Insufficient documentation

## 2013-02-15 DIAGNOSIS — J45909 Unspecified asthma, uncomplicated: Secondary | ICD-10-CM | POA: Insufficient documentation

## 2013-02-15 DIAGNOSIS — I10 Essential (primary) hypertension: Secondary | ICD-10-CM | POA: Insufficient documentation

## 2013-02-15 DIAGNOSIS — F329 Major depressive disorder, single episode, unspecified: Secondary | ICD-10-CM | POA: Insufficient documentation

## 2013-02-15 LAB — COMPREHENSIVE METABOLIC PANEL
ALT: 11 U/L (ref 0–35)
AST: 23 U/L (ref 0–37)
Albumin: 4 g/dL (ref 3.5–5.2)
Alkaline Phosphatase: 33 U/L — ABNORMAL LOW (ref 39–117)
BUN: 17 mg/dL (ref 6–23)
CO2: 21 mEq/L (ref 19–32)
Calcium: 9.3 mg/dL (ref 8.4–10.5)
Chloride: 108 mEq/L (ref 96–112)
Creatinine, Ser: 0.74 mg/dL (ref 0.50–1.10)
GFR calc Af Amer: >90 mL/min (ref >90)
GFR calc non Af Amer: >90 mL/min (ref >90)
Glucose, Bld: 80 mg/dL (ref 70–99)
Potassium: 4.2 mEq/L (ref 3.5–5.1)
Sodium: 136 mEq/L (ref 135–145)
Total Bilirubin: 0.1 mg/dL — ABNORMAL LOW (ref 0.3–1.2)
Total Protein: 7.3 g/dL (ref 6.0–8.3)

## 2013-02-15 LAB — CBC WITH DIFFERENTIAL/PLATELET
Basophils Absolute: 0 K/uL (ref 0.0–0.1)
Basophils Relative: 0 % (ref 0–1)
Eosinophils Absolute: 0.1 K/uL (ref 0.0–0.7)
Eosinophils Relative: 3 % (ref 0–5)
HCT: 34.4 % — ABNORMAL LOW (ref 36.0–46.0)
Hemoglobin: 11.6 g/dL — ABNORMAL LOW (ref 12.0–15.0)
Lymphocytes Relative: 31 % (ref 12–46)
Lymphs Abs: 1.7 K/uL (ref 0.7–4.0)
MCH: 31.6 pg (ref 26.0–34.0)
MCHC: 33.7 g/dL (ref 30.0–36.0)
MCV: 93.7 fL (ref 78.0–100.0)
Monocytes Absolute: 0.4 K/uL (ref 0.1–1.0)
Monocytes Relative: 7 % (ref 3–12)
Neutro Abs: 3.2 K/uL (ref 1.7–7.7)
Neutrophils Relative %: 59 % (ref 43–77)
Platelets: 214 K/uL (ref 150–400)
RBC: 3.67 MIL/uL — ABNORMAL LOW (ref 3.87–5.11)
RDW: 13 % (ref 11.5–15.5)
WBC: 5.3 K/uL (ref 4.0–10.5)

## 2013-02-15 LAB — POCT I-STAT TROPONIN I: Troponin i, poc: 0.01 ng/mL (ref 0.00–0.08)

## 2013-02-15 MED ORDER — ASPIRIN 81 MG PO CHEW
324.0000 mg | CHEWABLE_TABLET | Freq: Once | ORAL | Status: AC
  Administered 2013-02-15: 324 mg via ORAL
  Filled 2013-02-15: qty 4

## 2013-02-15 NOTE — ED Notes (Addendum)
C/o CP, onset today at ~ 1215, constant, also hypotension x1week. Instructed by PCP to increase salt intake. Recent blood work on 6/3. Also admits to some sob and shakiness, (denies: cough congestion cold sx, fever, nvd dizziness, palpitations/ fluttering, numbness tingling or other sx). No meds PTA.

## 2013-02-15 NOTE — ED Provider Notes (Signed)
Pt with low risk ACS, who presents with L sided upper chest wall pain which is worse with position and moving arma nd chest - no fever, cough chronically dyspneic. On exam has clear heart and lungs, reproducible ttp in the Left Upper chest which she states exactly reproduces her pain.  No swelling or asym of the legs.  ECG normal  R/o with 2 sets, anticipate dc home on nsaids.  Medical screening examination/treatment/procedure(s) were conducted as a shared visit with non-physician practitioner(s) and myself.  I personally evaluated the patient during the encounter    Vida Roller, MD 02/15/13 2119

## 2013-02-15 NOTE — ED Provider Notes (Signed)
History     CSN: [id number]  Arrival date & time 02/15/13  2022   First MD Initiated Contact with Patient 02/15/13 2037      Chief Complaint  Patient presents with  . Chest Pain  . Hypotension    (Consider location/radiation/quality/duration/timing/severity/associated sxs/prior treatment) HPI Comments: Patient presents to the emergency department with a chief complaint of chest pain. She states the chest pain began around 12:00 this afternoon. She states that she has asthma, but has not noticed any increased shortness of breath. She denies any diaphoresis. Denies any exertional component to the chest pain. He states that the chest pain has been persistent all day. Has been constant. She describes it is 10 out of 10. She has not tried taking anything to alleviate her symptoms. Pain does does not radiate.  The history is provided by the patient. No language interpreter was used.    Past Medical History  Diagnosis Date  . Asthma   . Endometrial cancer   . Depression   . History of migraine headaches     Followed by Dr. Vela Prose  . Chronic insomnia   . Osteopenia   . History of compression fracture of spine   . Obstructive sleep apnea     Past Surgical History  Procedure Laterality Date  . Abdominal hysterectomy  2003  . Breast enhancement surgery      Family History  Problem Relation Age of Onset  . Coronary artery disease Mother   . Breast cancer Maternal Grandmother   . Diabetes type II Maternal Grandfather   . Heart failure Mother   . Asthma Mother     History  Substance Use Topics  . Smoking status: Never Smoker   . Smokeless tobacco: Never Used     Comment: HUSBAND SMOKES   . Alcohol Use: No    OB History   Grav Para Term Preterm Abortions TAB SAB Ect Mult Living                  Review of Systems  All other systems reviewed and are negative.    Allergies  Review of patient's allergies indicates no known allergies.  Home Medications   Current  Outpatient Rx  Name  Route  Sig  Dispense  Refill  . albuterol (VENTOLIN HFA) 108 (90 BASE) MCG/ACT inhaler   Inhalation   Inhale 2 puffs into the lungs every 6 (six) hours as needed.   8.5 g   2   . ALPRAZolam (XANAX) 0.25 MG tablet   Oral   Take 0.25 mg by mouth at bedtime as needed.         . budesonide-formoterol (SYMBICORT) 160-4.5 MCG/ACT inhaler   Inhalation   Inhale 2 puffs into the lungs 2 (two) times daily.   3 Inhaler   1   . cetirizine (ZYRTEC) 10 MG tablet   Oral   Take 10 mg by mouth daily.         . cyclobenzaprine (FLEXERIL) 10 MG tablet   Oral   Take 10 mg by mouth daily as needed.          [name] FLUoxetine (PROZAC) 10 MG tablet   Oral   Take 40 mg by mouth daily as needed.          [name] HYDROmorphone (DILAUDID) 3 MG suppository   Rectal   Place 3 mg rectally every 6 (six) hours as needed.         . lidocaine (LIDODERM) 5 %  Transdermal   Place 1 patch onto the skin daily. Remove & Discard patch within 12 hours or as directed by MD         . ondansetron (ZOFRAN) 8 MG tablet   Oral   Take 8 mg by mouth every 8 (eight) hours as needed.         . rizatriptan (MAXALT) 10 MG tablet   Oral   Take 10 mg by mouth as needed. May repeat in 2 hours if needed         . rOPINIRole (REQUIP) 1 MG tablet      TAKE 1 TABLET BY MOUTH ONCE DAILY   90 tablet   1   . topiramate (TOPAMAX) 100 MG tablet      200 mg daily. 1 tablet in the morning and 2 tablets in the evening         . traZODone (DESYREL) 100 MG tablet   Oral   Take 1 tablet (100 mg total) by mouth at bedtime as needed.         . zolpidem (AMBIEN) 10 MG tablet   Oral   Take 10 mg by mouth.            BP 106/68  Pulse 65  Temp(Src) 98 F (36.7 C)  Resp 16  Ht 5\' 3"  (1.6 m)  Wt 160 lb (72.576 kg)  BMI 28.35 kg/m2  SpO2 98%  Physical Exam  Nursing note and vitals reviewed. Constitutional: She is oriented to person, place, and time. She appears well-developed and  well-nourished.  HENT:  Head: Normocephalic and atraumatic.  Eyes: Conjunctivae and EOM are normal. Pupils are equal, round, and reactive to light.  Neck: Normal range of motion. Neck supple.  Cardiovascular: Normal rate and regular rhythm.  Exam reveals no gallop and no friction rub.   No murmur heard. Pulmonary/Chest: Effort normal and breath sounds normal. No respiratory distress. She has no wheezes. She has no rales. She exhibits no tenderness.  Chest wall tender to palpation over the sternum and left breast  Abdominal: Soft. Bowel sounds are normal. She exhibits no distension and no mass. There is no tenderness. There is no rebound and no guarding.  Musculoskeletal: Normal range of motion. She exhibits no edema and no tenderness.  Neurological: She is alert and oriented to person, place, and time.  Skin: Skin is warm and dry.  Psychiatric: She has a normal mood and affect. Her behavior is normal. Judgment and thought content normal.    ED Course  Procedures (including critical care time)  Labs Reviewed  CBC WITH DIFFERENTIAL - Abnormal; Notable for the following:    RBC 3.67 (*)    Hemoglobin 11.6 (*)    HCT 34.4 (*)    All other components within normal limits  COMPREHENSIVE METABOLIC PANEL  POCT I-STAT TROPONIN I   Results for orders placed during the hospital encounter of 02/15/13  CBC WITH DIFFERENTIAL      Result Value Range   WBC 5.3  4.0 - 10.5 K/uL   RBC 3.67 (*) 3.87 - 5.11 MIL/uL   Hemoglobin 11.6 (*) 12.0 - 15.0 g/dL   HCT 16.1 (*) 09.6 - 04.5 %   MCV 93.7  78.0 - 100.0 fL   MCH 31.6  26.0 - 34.0 pg   MCHC 33.7  30.0 - 36.0 g/dL   RDW 40.9  81.1 - 91.4 %   Platelets 214  150 - 400 K/uL   Neutrophils Relative % 59  43 - 77 %   Neutro Abs 3.2  1.7 - 7.7 K/uL   Lymphocytes Relative 31  12 - 46 %   Lymphs Abs 1.7  0.7 - 4.0 K/uL   Monocytes Relative 7  3 - 12 %   Monocytes Absolute 0.4  0.1 - 1.0 K/uL   Eosinophils Relative 3  0 - 5 %   Eosinophils Absolute  0.1  0.0 - 0.7 K/uL   Basophils Relative 0  0 - 1 %   Basophils Absolute 0.0  0.0 - 0.1 K/uL  COMPREHENSIVE METABOLIC PANEL      Result Value Range   Sodium 136  135 - 145 mEq/L   Potassium 4.2  3.5 - 5.1 mEq/L   Chloride 108  96 - 112 mEq/L   CO2 21  19 - 32 mEq/L   Glucose, Bld 80  70 - 99 mg/dL   BUN 17  6 - 23 mg/dL   Creatinine, Ser 2.13  0.50 - 1.10 mg/dL   Calcium 9.3  8.4 - 08.6 mg/dL   Total Protein 7.3  6.0 - 8.3 g/dL   Albumin 4.0  3.5 - 5.2 g/dL   AST 23  0 - 37 U/L   ALT 11  0 - 35 U/L   Alkaline Phosphatase 33 (*) 39 - 117 U/L   Total Bilirubin 0.1 (*) 0.3 - 1.2 mg/dL   GFR calc non Af Amer >90  >90 mL/min   GFR calc Af Amer >90  >90 mL/min  POCT I-STAT TROPONIN I      Result Value Range   Troponin i, poc 0.01  0.00 - 0.08 ng/mL   Comment 3           POCT I-STAT TROPONIN I      Result Value Range   Troponin i, poc 0.01  0.00 - 0.08 ng/mL   Comment 3            Dg Chest Portable 1 View  02/15/2013   *RADIOLOGY REPORT*  Clinical Data: Chest pain and cough.  PORTABLE CHEST - 1 VIEW  Comparison: 04/04/2008 and 08/30/2004  Findings: The cardiomediastinal silhouette is unremarkable. The lungs are clear. There is no evidence of focal airspace disease, pulmonary edema, suspicious pulmonary nodule/mass, pleural effusion, or pneumothorax. No acute bony abnormalities are identified.  IMPRESSION: No evidence of active cardiopulmonary disease.   Original Report Authenticated By: Harmon Pier, M.D.     ED ECG REPORT  I personally interpreted this EKG   Date: 02/15/2013   Rate: 70  Rhythm: normal sinus rhythm  QRS Axis: normal  Intervals: normal  ST/T Wave abnormalities: normal  Conduction Disutrbances:none  Narrative Interpretation:   Old EKG Reviewed: none available    1. Chest pain       MDM  Patient with chest pain. Will check basic labs, EKG, chest x-ray, and will reevaluate.    9:30 PM Patient seen by and discussed with Dr. Hyacinth Meeker.  Will get second troponin  at 2300, and likely discharge home.  Feel this to be msk, as it is reproducible with palpation and chest flexion.  12:18 AM Second troponin is negative. Will discharge to home. Still believe the patient is feeling musculoskeletal chest pain. Patient is stable and ready for discharge.    Roxy Horseman, PA-C 02/16/13 [phone]

## 2013-02-16 LAB — POCT I-STAT TROPONIN I: Troponin i, poc: 0.01 ng/mL (ref 0.00–0.08)

## 2013-02-16 MED ORDER — IBUPROFEN 600 MG PO TABS: 600.0000 mg | ORAL_TABLET | Freq: Four times a day (QID) | ORAL | Status: DC | PRN

## 2013-02-16 NOTE — ED Provider Notes (Signed)
  Medical screening examination/treatment/procedure(s) were conducted as a shared visit with non-physician practitioner(s) and myself.  I personally evaluated the patient during the encounter  Please see my separate respective documentation pertaining to this patient encounter   Vida Roller, MD 02/16/13 2117

## 2013-02-17 ENCOUNTER — Ambulatory Visit: Payer: 59 | Admitting: Internal Medicine

## 2013-02-28 ENCOUNTER — Other Ambulatory Visit: Payer: Self-pay | Admitting: *Deleted

## 2013-02-28 MED ORDER — ALBUTEROL SULFATE HFA 108 (90 BASE) MCG/ACT IN AERS: 2.0000 | INHALATION_SPRAY | Freq: Four times a day (QID) | RESPIRATORY_TRACT | Status: DC | PRN

## 2013-04-09 ENCOUNTER — Other Ambulatory Visit: Payer: Self-pay | Admitting: Internal Medicine

## 2013-05-16 ENCOUNTER — Other Ambulatory Visit (INDEPENDENT_AMBULATORY_CARE_PROVIDER_SITE_OTHER): Payer: 59

## 2013-05-16 DIAGNOSIS — Z79899 Other long term (current) drug therapy: Secondary | ICD-10-CM

## 2013-05-16 DIAGNOSIS — G43019 Migraine without aura, intractable, without status migrainosus: Secondary | ICD-10-CM

## 2013-05-16 LAB — CBC WITH DIFFERENTIAL/PLATELET
Basophils Absolute: 0 10*3/uL (ref 0.0–0.1)
Basophils Relative: 0.5 % (ref 0.0–3.0)
Eosinophils Absolute: 0.2 10*3/uL (ref 0.0–0.7)
Eosinophils Relative: 4.9 % (ref 0.0–5.0)
HCT: 33.7 % — ABNORMAL LOW (ref 36.0–46.0)
Hemoglobin: 11.4 g/dL — ABNORMAL LOW (ref 12.0–15.0)
Lymphocytes Relative: 33.9 % (ref 12.0–46.0)
Lymphs Abs: 1.5 10*3/uL (ref 0.7–4.0)
MCHC: 33.8 g/dL (ref 30.0–36.0)
MCV: 95.2 fl (ref 78.0–100.0)
Monocytes Absolute: 0.4 10*3/uL (ref 0.1–1.0)
Monocytes Relative: 9.4 % (ref 3.0–12.0)
Neutro Abs: 2.3 10*3/uL (ref 1.4–7.7)
Neutrophils Relative %: 51.3 % (ref 43.0–77.0)
Platelets: 155 10*3/uL (ref 150.0–400.0)
RBC: 3.54 Mil/uL — ABNORMAL LOW (ref 3.87–5.11)
RDW: 14.7 % — ABNORMAL HIGH (ref 11.5–14.6)
WBC: 4.5 10*3/uL (ref 4.5–10.5)

## 2013-05-16 LAB — ALT: ALT: 16 U/L (ref 0–35)

## 2013-05-16 LAB — AST: AST: 20 U/L (ref 0–37)

## 2013-07-17 ENCOUNTER — Other Ambulatory Visit: Payer: Self-pay

## 2013-07-18 ENCOUNTER — Ambulatory Visit (INDEPENDENT_AMBULATORY_CARE_PROVIDER_SITE_OTHER): Payer: 59 | Admitting: Internal Medicine

## 2013-07-18 ENCOUNTER — Encounter: Payer: Self-pay | Admitting: Internal Medicine

## 2013-07-18 ENCOUNTER — Telehealth: Payer: Self-pay | Admitting: Family Medicine

## 2013-07-18 ENCOUNTER — Telehealth: Payer: Self-pay | Admitting: Internal Medicine

## 2013-07-18 VITALS — BP 100/70 | HR 69 | Temp 98.4°F | Wt 184.0 lb

## 2013-07-18 DIAGNOSIS — H04203 Unspecified epiphora, bilateral lacrimal glands: Secondary | ICD-10-CM | POA: Insufficient documentation

## 2013-07-18 DIAGNOSIS — J309 Allergic rhinitis, unspecified: Secondary | ICD-10-CM | POA: Insufficient documentation

## 2013-07-18 DIAGNOSIS — H04209 Unspecified epiphora, unspecified lacrimal gland: Secondary | ICD-10-CM

## 2013-07-18 DIAGNOSIS — R05 Cough: Secondary | ICD-10-CM

## 2013-07-18 DIAGNOSIS — J019 Acute sinusitis, unspecified: Secondary | ICD-10-CM | POA: Insufficient documentation

## 2013-07-18 DIAGNOSIS — R059 Cough, unspecified: Secondary | ICD-10-CM

## 2013-07-18 MED ORDER — FLUTICASONE PROPIONATE 50 MCG/ACT NA SUSP: NASAL | Status: DC

## 2013-07-18 MED ORDER — OLOPATADINE HCL 0.2 % OP SOLN: 1.0000 [drp] | Freq: Every day | OPHTHALMIC | Status: DC

## 2013-07-18 MED ORDER — AMOXICILLIN-POT CLAVULANATE 875-125 MG PO TABS: 1.0000 | ORAL_TABLET | Freq: Two times a day (BID) | ORAL | Status: DC

## 2013-07-18 NOTE — Progress Notes (Signed)
Chief Complaint  Patient presents with  . Headache    Pt complains of runny watery eyes.  Started a week ago.  Taking Mucinex.  Has had some wheezing  . Nasal Congestion  . Post Nasal Drip  . Generalized Body Aches  . Cough    HPI: Patient comes in today for SDA for  new problem evaluation. PCP NA  Above. About  A week of sx worse over the   Head ache teeth pain and some frontal progressing .   Feels like sinusitis she has had in the past.    Hx of migraines  This is different that this   mucinx and advil   Zyrtec every day.    Fall ocngestion.  Eyes puffy.  In am cheeks hurt    post nasal dripo. Cant tell color.   Also has watery eyes and asks for pataday to try  Has worked in past.  ROS: See pertinent positives and negatives per HPI. No cough sig cp sob   Past Medical History  Diagnosis Date  . Asthma   . Endometrial cancer   . Depression   . History of migraine headaches     Followed by Dr. Vela Prose  . Chronic insomnia   . Osteopenia   . History of compression fracture of spine   . Obstructive sleep apnea     Family History  Problem Relation Age of Onset  . Coronary artery disease Mother   . Breast cancer Maternal Grandmother   . Diabetes type II Maternal Grandfather   . Heart failure Mother   . Asthma Mother     History   Social History  . Marital Status: Married    Spouse Name: N/A    Number of Children: 2  . Years of Education: N/A   Occupational History  .     Social History Main Topics  . Smoking status: Never Smoker   . Smokeless tobacco: Never Used     Comment: HUSBAND SMOKES   . Alcohol Use: No  . Drug Use: No  . Sexual Activity: Yes    Birth Control/ Protection: Surgical   Other Topics Concern  . None   Social History Narrative   Married 21 years   She has 2 sons ages 6 and 57   She works as travel Visual merchandiser Prescriptions as of 07/18/2013  Medication Sig  . albuterol (VENTOLIN HFA) 108 (90 BASE) MCG/ACT  inhaler Inhale 2 puffs into the lungs every 6 (six) hours as needed.  . ALPRAZolam (XANAX) 0.25 MG tablet Take 0.125 mg by mouth 3 (three) times daily as needed for sleep or anxiety.   [name] aspirin 81 MG chewable tablet Chew 81 mg by mouth daily.  . cetirizine (ZYRTEC) 10 MG tablet Take 10 mg by mouth daily.  . cyclobenzaprine (FLEXERIL) 10 MG tablet Take 10 mg by mouth 3 (three) times daily as needed for muscle spasms.   [name] FLUoxetine (PROZAC) 10 MG tablet Take 20 mg by mouth daily.   [name] HYDROmorphone (DILAUDID) 3 MG suppository Place 3 mg rectally every 6 (six) hours as needed for pain.   [name] ibuprofen (ADVIL,MOTRIN) 600 MG tablet Take 1 tablet (600 mg total) by mouth every 6 (six) hours as needed for pain.  [name] lidocaine (LIDODERM) 5 % Place 1 patch onto the skin daily. Remove & Discard patch within 12 hours or as directed by MD  . promethazine (PHENERGAN) 25 MG tablet Take 25 mg by mouth  every 6 (six) hours as needed for nausea or vomiting.  . rizatriptan (MAXALT) 10 MG tablet Take 10 mg by mouth as needed for migraine. May repeat in 2 hours if needed  . rOPINIRole (REQUIP) 1 MG tablet Take 0.5 mg by mouth at bedtime.  . SYMBICORT 160-4.5 MCG/ACT inhaler INHALE 2 PUFFS INTO THE LUNGS 2 (TWO) TIMES DAILY.  . traZODone (DESYREL) 100 MG tablet Take 150 mg by mouth at bedtime as needed for sleep.   [name] zolpidem (AMBIEN) 10 MG tablet Take 10 mg by mouth.   . zonisamide (ZONEGRAN) 100 MG capsule Take 400 mg by mouth at bedtime.  [name] amoxicillin-clavulanate (AUGMENTIN) 875-125 MG per tablet Take 1 tablet by mouth every 12 (twelve) hours.  . fluticasone (FLONASE) 50 MCG/ACT nasal spray 2 spray each nostril qd  . Olopatadine HCl 0.2 % SOLN Apply 1 drop to eye daily.  . [DISCONTINUED] Olopatadine HCl 0.2 % SOLN Apply 1 drop to eye daily.  . [DISCONTINUED] ondansetron (ZOFRAN) 8 MG tablet Take 8 mg by mouth every 8 (eight) hours as needed for nausea.   . [DISCONTINUED] topiramate (TOPAMAX) 100 MG tablet Take 200 mg  by mouth 2 (two) times daily.     EXAM:  BP 100/70  Pulse 69  Temp(Src) 98.4 F (36.9 C) (Oral)  Wt 184 lb (83.462 kg)  SpO2 98%  Body mass index is 32.6 kg/(m^2).  WDWN in NAD  quiet respirations; mildly congested  somewhat hoarse. Non toxic . Looks uncomfortable  HEENT: Normocephalic ;atraumatic , Eyes;  PERRL, EOMs  Full, lids and conjunctiva clear watery dc ,,Ears: no deformities, canals nl, TM landmarks normal, clear fluid left  Nose: no deformity or discharge but 1+congested;face  Tender left maxilla  Worse some frontal  Mouth : OP clear without lesion or edema . drainge tracks red uvula no petechiae Neck: Supple without adenopathy or masses  mildly tender ac no adenopathy Chest:  Clear to Awithout wheezes rales or rhonchi CV:  S1-S2 no gallops or murmurs peripheral perfusion is normal Skin :nl perfusion and no acute rashes  ASSESSMENT AND PLAN:  Discussed the following assessment and plan:  Sinusitis, acute - sx worse after a week and poss underlying alleric phenom weekend coming up empiric rx add flonase  Allergic rhinitis  Watery eyes - add pataday trial  -Patient advised to return or notify health care team  if symptoms worsen or persist or new concerns arise.  Patient Instructions  Add flonase continue saline Antibiotic   Should improve in 3-5 days  Contact if not .  Or as needed   Neta Mends. Panosh M.D.

## 2013-07-18 NOTE — Telephone Encounter (Signed)
Left message for pt to call back  °

## 2013-07-18 NOTE — Addendum Note (Signed)
Addended by: Alfred Levins D on: 07/18/2013 02:21 PM   Modules accepted: Orders

## 2013-07-18 NOTE — Telephone Encounter (Signed)
Pt at pharm and Olopatadine HCl 0.2 % SOLN wasn't called in. Can you call that for pt? They got everything else.

## 2013-07-18 NOTE — Telephone Encounter (Signed)
The pt came into the office today to be seen due to illness.  While here she mentioned that she would like to have a chest x-ray.  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  She likes to keep a check on her lungs.  Please advise if chest x-ray can be ordered.  Also mentioned that she would like to restart Singulair.  Has trouble using her inhaler.  Believes the Singulair has helped more in the past.  Please call the pt on these two issues and recommend what can be done.  Thanks!

## 2013-07-18 NOTE — Telephone Encounter (Signed)
Pt aware, orders placed

## 2013-07-18 NOTE — Telephone Encounter (Signed)
Ok for CXR pa/lat - use cough code.  I suggest she make appt for [place] to perform spirometry in the office to help use determine best treatment option.

## 2013-07-18 NOTE — Telephone Encounter (Signed)
Sent in electronically .  

## 2013-07-18 NOTE — Patient Instructions (Signed)
Add flonase continue saline Antibiotic   Should improve in 3-5 days  Contact if not .  Or as needed

## 2013-09-11 DIAGNOSIS — R569 Unspecified convulsions: Secondary | ICD-10-CM

## 2013-09-11 HISTORY — DX: Unspecified convulsions: R56.9

## 2013-09-19 ENCOUNTER — Ambulatory Visit (INDEPENDENT_AMBULATORY_CARE_PROVIDER_SITE_OTHER): Payer: 59 | Admitting: Internal Medicine

## 2013-09-19 DIAGNOSIS — R05 Cough: Secondary | ICD-10-CM

## 2013-09-19 DIAGNOSIS — R059 Cough, unspecified: Secondary | ICD-10-CM

## 2013-09-19 LAB — PULMONARY FUNCTION TEST
DL/VA % pred: 110 %
DL/VA: 5.29 ml/min/mmHg/L
DLCO unc % pred: 98 %
DLCO unc: 23.88 ml/min/mmHg
FEF 25-75 Post: 2.59 L/sec
FEF 25-75 Pre: 1.86 L/sec
FEF2575-%Change-Post: 39 %
FEF2575-%Pred-Post: 95 %
FEF2575-%Pred-Pre: 68 %
FEV1-%Change-Post: 8 %
FEV1-%Pred-Post: 87 %
FEV1-%Pred-Pre: 80 %
FEV1-Post: 2.43 L
FEV1-Pre: 2.23 L
FEV1FVC-%Change-Post: 5 %
FEV1FVC-%Pred-Pre: 98 %
FEV6-%Change-Post: 3 %
FEV6-%Pred-Post: 85 %
FEV6-%Pred-Pre: 83 %
FEV6-Post: 2.94 L
FEV6-Pre: 2.84 L
FEV6FVC-%Pred-Post: 103 %
FEV6FVC-%Pred-Pre: 103 %
FVC-%Change-Post: 3 %
FVC-%Pred-Post: 83 %
FVC-%Pred-Pre: 80 %
FVC-Post: 2.94 L
FVC-Pre: 2.84 L
Post FEV1/FVC ratio: 83 %
Post FEV6/FVC ratio: 100 %
Pre FEV1/FVC ratio: 79 %
Pre FEV6/FVC Ratio: 100 %
RV % pred: 83 %
RV: 1.51 L
TLC % pred: 86 %
TLC: 4.36 L

## 2013-09-19 NOTE — Progress Notes (Signed)
PFT done today. 

## 2013-10-06 ENCOUNTER — Other Ambulatory Visit: Payer: Self-pay | Admitting: Internal Medicine

## 2013-10-15 ENCOUNTER — Encounter: Payer: Self-pay | Admitting: Internal Medicine

## 2013-10-15 ENCOUNTER — Other Ambulatory Visit: Payer: Self-pay | Admitting: Internal Medicine

## 2013-10-15 ENCOUNTER — Ambulatory Visit (INDEPENDENT_AMBULATORY_CARE_PROVIDER_SITE_OTHER): Payer: 59 | Admitting: Internal Medicine

## 2013-10-15 VITALS — BP 116/82 | HR 68 | Temp 98.6°F | Ht 63.0 in | Wt 177.0 lb

## 2013-10-15 DIAGNOSIS — M810 Age-related osteoporosis without current pathological fracture: Secondary | ICD-10-CM

## 2013-10-15 DIAGNOSIS — J45909 Unspecified asthma, uncomplicated: Secondary | ICD-10-CM

## 2013-10-15 DIAGNOSIS — G2581 Restless legs syndrome: Secondary | ICD-10-CM

## 2013-10-15 MED ORDER — BECLOMETHASONE DIPROPIONATE 80 MCG/ACT IN AERS: 2.0000 | INHALATION_SPRAY | Freq: Two times a day (BID) | RESPIRATORY_TRACT | Status: DC

## 2013-10-15 MED ORDER — PRAMIPEXOLE DIHYDROCHLORIDE 0.25 MG PO TABS: 0.2500 mg | ORAL_TABLET | Freq: Every day | ORAL | Status: DC

## 2013-10-15 MED ORDER — DENOSUMAB 60 MG/ML ~~LOC~~ SOLN
60.0000 mg | Freq: Once | SUBCUTANEOUS | Status: AC
  Administered 2013-10-15: 60 mg via SUBCUTANEOUS

## 2013-10-15 NOTE — Progress Notes (Signed)
Subjective:    Patient ID: Kathryn [name], female    DOB: 06-Feb-1961, 53 y.o.   MRN: [id number]  HPI  53 year old white female with history of asthma, depression and osteoporosis for followup. Patient was coughing more than usual. She has not been using her maintenance inhaler a regular basis. She completed pulmonary function testing. It showed minimal airway obstruction. She has small airway disease. She had response to bronchodilator in small airways. She has normal lung volumes and normal diffusion.  Patient reports rarely needing her rescue inhaler.  Patient also has history of restless leg syndrome. She reports persistent symptoms despite using Requip.  Review of Systems Negative for chest pain.  No current wheezing or cough.  She reports having "stomach virus" recently with nausea.    Past Medical History  Diagnosis Date  . Asthma   . Endometrial cancer   . Depression   . History of migraine headaches     Followed by Dr. Melton Alar  . Chronic insomnia   . Osteopenia   . History of compression fracture of spine   . Obstructive sleep apnea     History   Social History  . Marital Status: Married    Spouse Name: N/A    Number of Children: 2  . Years of Education: N/A   Occupational History  .     Social History Main Topics  . Smoking status: Never Smoker   . Smokeless tobacco: Never Used     Comment: HUSBAND SMOKES   . Alcohol Use: No  . Drug Use: No  . Sexual Activity: Yes    Birth Control/ Protection: Surgical   Other Topics Concern  . Not on file   Social History Narrative   Married 21 years   She has 2 sons ages 61 and 27   She works as travel Optometrist    Past Surgical History  Procedure Laterality Date  . Abdominal hysterectomy  2003  . Breast enhancement surgery      Family History  Problem Relation Age of Onset  . Coronary artery disease Mother   . Breast cancer Maternal Grandmother   . Diabetes type II Maternal Grandfather   . Heart  failure Mother   . Asthma Mother     No Known Allergies  Current Outpatient Prescriptions on File Prior to Visit  Medication Sig Dispense Refill  . albuterol (VENTOLIN HFA) 108 (90 BASE) MCG/ACT inhaler Inhale 2 puffs into the lungs every 6 (six) hours as needed.  8.5 g  2  . ALPRAZolam (XANAX) 0.25 MG tablet Take 0.125 mg by mouth 3 (three) times daily as needed for sleep or anxiety.       [name] aspirin 81 MG chewable tablet Chew 81 mg by mouth daily.      . cetirizine (ZYRTEC) 10 MG tablet Take 10 mg by mouth daily.      . cyclobenzaprine (FLEXERIL) 10 MG tablet Take 10 mg by mouth 3 (three) times daily as needed for muscle spasms.       [name] FLUoxetine (PROZAC) 10 MG tablet Take 20 mg by mouth daily.       . fluticasone (FLONASE) 50 MCG/ACT nasal spray 2 spray each nostril qd  16 g  3  . HYDROmorphone (DILAUDID) 3 MG suppository Place 3 mg rectally every 6 (six) hours as needed for pain.       [name] ibuprofen (ADVIL,MOTRIN) 600 MG tablet Take 1 tablet (600 mg total) by mouth every 6 (six) hours as  needed for pain.  30 tablet  0  . lidocaine (LIDODERM) 5 % Place 1 patch onto the skin daily. Remove & Discard patch within 12 hours or as directed by MD      . Olopatadine HCl 0.2 % SOLN Apply 1 drop to eye daily.  1 Bottle  0  . promethazine (PHENERGAN) 25 MG tablet Take 25 mg by mouth every 6 (six) hours as needed for nausea or vomiting.      . rizatriptan (MAXALT) 10 MG tablet Take 10 mg by mouth as needed for migraine. May repeat in 2 hours if needed      . traZODone (DESYREL) 100 MG tablet Take 150 mg by mouth at bedtime as needed for sleep.       [name] zolpidem (AMBIEN) 10 MG tablet Take 10 mg by mouth.        No current facility-administered medications on file prior to visit.    BP 116/82  Pulse 68  Temp(Src) 98.6 F (37 C) (Oral)  Ht 5\' 3"  (1.6 m)  Wt 177 lb (80.287 kg)  BMI 31.36 kg/m2    Objective:   Physical Exam  Constitutional: She is oriented to person, place, and time. She  appears well-developed and well-nourished. No distress.  HENT:  Head: Normocephalic and atraumatic.  Cardiovascular: Normal rate, regular rhythm and normal heart sounds.   No murmur heard. Pulmonary/Chest: Effort normal and breath sounds normal. She has no wheezes.  Musculoskeletal: She exhibits no edema.  Neurological: She is oriented to person, place, and time.  Skin: Skin is warm and dry.  Psychiatric: She has a normal mood and affect. Her behavior is normal.          Assessment & Plan:

## 2013-10-15 NOTE — Assessment & Plan Note (Signed)
No change in therapy

## 2013-10-15 NOTE — Assessment & Plan Note (Signed)
She has not been using Symbicort regularly.  She rarely requires rescue inhalers.  Resume Qvar.  If patient still has issues with compliance with maintenance inhalers, consider switch to Singulair.

## 2013-10-15 NOTE — Assessment & Plan Note (Signed)
Patient reports refractory symptoms despite taking Requip. Switched to Mirapex.  Start at 0.25 mg.  Titrate to higher dose if needed.

## 2013-10-15 NOTE — Progress Notes (Signed)
Pre visit review using our clinic review tool, if applicable. No additional management support is needed unless otherwise documented below in the visit note. 

## 2013-10-17 ENCOUNTER — Telehealth: Payer: Self-pay | Admitting: *Deleted

## 2013-10-17 NOTE — Telephone Encounter (Signed)
Message copied by Pearletha Forge on Fri Oct 17, 2013 10:31 AM ------      Message from: Rosine Abe      Created: Wed Oct 15, 2013  5:33 PM       Call pt - we changed her requip to mirapax.  If 0.25 mg not effective, she can take 2 tabs or .5 mg at bedtime. ------

## 2013-10-17 NOTE — Telephone Encounter (Signed)
Left message for pt to call back  °

## 2013-10-24 NOTE — Telephone Encounter (Signed)
Pt notified via mychart

## 2013-11-05 ENCOUNTER — Observation Stay (HOSPITAL_COMMUNITY)
Admission: EM | Admit: 2013-11-05 | Discharge: 2013-11-08 | Disposition: A | Discharge status: Home / Self Care | Payer: 59 | Attending: Internal Medicine | Admitting: Internal Medicine

## 2013-11-05 ENCOUNTER — Emergency Department (HOSPITAL_COMMUNITY): Payer: 59

## 2013-11-05 ENCOUNTER — Encounter (HOSPITAL_COMMUNITY): Payer: Self-pay | Admitting: Emergency Medicine

## 2013-11-05 ENCOUNTER — Inpatient Hospital Stay (HOSPITAL_COMMUNITY): Payer: 59

## 2013-11-05 DIAGNOSIS — M810 Age-related osteoporosis without current pathological fracture: Secondary | ICD-10-CM

## 2013-11-05 DIAGNOSIS — R55 Syncope and collapse: Secondary | ICD-10-CM | POA: Diagnosis present

## 2013-11-05 DIAGNOSIS — F32A Depression, unspecified: Secondary | ICD-10-CM

## 2013-11-05 DIAGNOSIS — I959 Hypotension, unspecified: Secondary | ICD-10-CM

## 2013-11-05 DIAGNOSIS — S298XXA Other specified injuries of thorax, initial encounter: Secondary | ICD-10-CM | POA: Insufficient documentation

## 2013-11-05 DIAGNOSIS — S199XXA Unspecified injury of neck, initial encounter: Secondary | ICD-10-CM

## 2013-11-05 DIAGNOSIS — J309 Allergic rhinitis, unspecified: Secondary | ICD-10-CM

## 2013-11-05 DIAGNOSIS — M899 Disorder of bone, unspecified: Secondary | ICD-10-CM | POA: Insufficient documentation

## 2013-11-05 DIAGNOSIS — Z8249 Family history of ischemic heart disease and other diseases of the circulatory system: Secondary | ICD-10-CM

## 2013-11-05 DIAGNOSIS — Z8542 Personal history of malignant neoplasm of other parts of uterus: Secondary | ICD-10-CM | POA: Insufficient documentation

## 2013-11-05 DIAGNOSIS — G2581 Restless legs syndrome: Secondary | ICD-10-CM | POA: Diagnosis present

## 2013-11-05 DIAGNOSIS — F329 Major depressive disorder, single episode, unspecified: Secondary | ICD-10-CM | POA: Insufficient documentation

## 2013-11-05 DIAGNOSIS — Z8781 Personal history of (healed) traumatic fracture: Secondary | ICD-10-CM | POA: Insufficient documentation

## 2013-11-05 DIAGNOSIS — Y9389 Activity, other specified: Secondary | ICD-10-CM | POA: Insufficient documentation

## 2013-11-05 DIAGNOSIS — G4733 Obstructive sleep apnea (adult) (pediatric): Secondary | ICD-10-CM

## 2013-11-05 DIAGNOSIS — G43909 Migraine, unspecified, not intractable, without status migrainosus: Secondary | ICD-10-CM

## 2013-11-05 DIAGNOSIS — S22080A Wedge compression fracture of T11-T12 vertebra, initial encounter for closed fracture: Secondary | ICD-10-CM | POA: Diagnosis present

## 2013-11-05 DIAGNOSIS — E041 Nontoxic single thyroid nodule: Secondary | ICD-10-CM

## 2013-11-05 DIAGNOSIS — IMO0002 Reserved for concepts with insufficient information to code with codable children: Secondary | ICD-10-CM | POA: Insufficient documentation

## 2013-11-05 DIAGNOSIS — G4700 Insomnia, unspecified: Secondary | ICD-10-CM | POA: Insufficient documentation

## 2013-11-05 DIAGNOSIS — J45909 Unspecified asthma, uncomplicated: Secondary | ICD-10-CM | POA: Diagnosis present

## 2013-11-05 DIAGNOSIS — S22009A Unspecified fracture of unspecified thoracic vertebra, initial encounter for closed fracture: Secondary | ICD-10-CM

## 2013-11-05 DIAGNOSIS — T148XXA Other injury of unspecified body region, initial encounter: Secondary | ICD-10-CM

## 2013-11-05 DIAGNOSIS — F3289 Other specified depressive episodes: Secondary | ICD-10-CM | POA: Insufficient documentation

## 2013-11-05 DIAGNOSIS — S0990XA Unspecified injury of head, initial encounter: Principal | ICD-10-CM | POA: Insufficient documentation

## 2013-11-05 DIAGNOSIS — S0993XA Unspecified injury of face, initial encounter: Secondary | ICD-10-CM | POA: Insufficient documentation

## 2013-11-05 DIAGNOSIS — Y92410 Unspecified street and highway as the place of occurrence of the external cause: Secondary | ICD-10-CM | POA: Insufficient documentation

## 2013-11-05 DIAGNOSIS — Z79899 Other long term (current) drug therapy: Secondary | ICD-10-CM | POA: Insufficient documentation

## 2013-11-05 DIAGNOSIS — F5104 Psychophysiologic insomnia: Secondary | ICD-10-CM

## 2013-11-05 DIAGNOSIS — M949 Disorder of cartilage, unspecified: Secondary | ICD-10-CM

## 2013-11-05 HISTORY — DX: Low back pain: M54.5

## 2013-11-05 HISTORY — DX: Wedge compression fracture of T11-T12 vertebra, initial encounter for closed fracture: S22.080A

## 2013-11-05 HISTORY — DX: Family history of other specified conditions: Z84.89

## 2013-11-05 HISTORY — DX: Headache, unspecified: R51.9

## 2013-11-05 HISTORY — DX: Nonspecific reaction to tuberculin skin test without active tuberculosis: R76.11

## 2013-11-05 HISTORY — DX: Headache: R51

## 2013-11-05 HISTORY — DX: Anemia, unspecified: D64.9

## 2013-11-05 HISTORY — DX: Gastro-esophageal reflux disease without esophagitis: K21.9

## 2013-11-05 HISTORY — DX: Other chronic pain: G89.29

## 2013-11-05 HISTORY — DX: Anxiety disorder, unspecified: F41.9

## 2013-11-05 HISTORY — DX: Low back pain, unspecified: M54.50

## 2013-11-05 HISTORY — DX: Migraine, unspecified, not intractable, without status migrainosus: G43.909

## 2013-11-05 LAB — CBC WITH DIFFERENTIAL/PLATELET
Basophils Absolute: 0 10*3/uL (ref 0.0–0.1)
Basophils Relative: 0 % (ref 0–1)
Eosinophils Absolute: 0.1 10*3/uL (ref 0.0–0.7)
Eosinophils Relative: 1 % (ref 0–5)
HCT: 38.2 % (ref 36.0–46.0)
Hemoglobin: 13.1 g/dL (ref 12.0–15.0)
Lymphocytes Relative: 14 % (ref 12–46)
Lymphs Abs: 0.9 10*3/uL (ref 0.7–4.0)
MCH: 32 pg (ref 26.0–34.0)
MCHC: 34.3 g/dL (ref 30.0–36.0)
MCV: 93.2 fL (ref 78.0–100.0)
Monocytes Absolute: 0.4 10*3/uL (ref 0.1–1.0)
Monocytes Relative: 5 % (ref 3–12)
Neutro Abs: 5.5 10*3/uL (ref 1.7–7.7)
Neutrophils Relative %: 80 % — ABNORMAL HIGH (ref 43–77)
Platelets: 167 10*3/uL (ref 150–400)
RBC: 4.1 MIL/uL (ref 3.87–5.11)
RDW: 13.1 % (ref 11.5–15.5)
WBC: 6.8 10*3/uL (ref 4.0–10.5)

## 2013-11-05 LAB — BASIC METABOLIC PANEL
BUN: 15 mg/dL (ref 6–23)
CO2: 29 mEq/L (ref 19–32)
Calcium: 9.5 mg/dL (ref 8.4–10.5)
Chloride: 102 mEq/L (ref 96–112)
Creatinine, Ser: 0.75 mg/dL (ref 0.50–1.10)
GFR calc Af Amer: >90 mL/min (ref >90)
GFR calc non Af Amer: >90 mL/min (ref >90)
Glucose, Bld: 100 mg/dL — ABNORMAL HIGH (ref 70–99)
Potassium: 4.2 mEq/L (ref 3.7–5.3)
Sodium: 141 mEq/L (ref 137–147)

## 2013-11-05 LAB — I-STAT TROPONIN, ED: Troponin i, poc: 0.01 ng/mL (ref 0.00–0.08)

## 2013-11-05 MED ORDER — ONDANSETRON HCL 4 MG/2ML IJ SOLN
4.0000 mg | Freq: Four times a day (QID) | INTRAMUSCULAR | Status: DC | PRN
  Administered 2013-11-05 – 2013-11-08 (×5): 4 mg via INTRAVENOUS
  Filled 2013-11-05 (×5): qty 2

## 2013-11-05 MED ORDER — ZOLPIDEM TARTRATE 5 MG PO TABS
10.0000 mg | ORAL_TABLET | Freq: Every day | ORAL | Status: DC
  Administered 2013-11-06 – 2013-11-07 (×2): 10 mg via ORAL
  Filled 2013-11-05 (×2): qty 2

## 2013-11-05 MED ORDER — SODIUM CHLORIDE 0.9 % IJ SOLN
3.0000 mL | Freq: Two times a day (BID) | INTRAMUSCULAR | Status: DC
  Administered 2013-11-05 – 2013-11-08 (×4): 3 mL via INTRAVENOUS

## 2013-11-05 MED ORDER — HYDROMORPHONE HCL PF 1 MG/ML IJ SOLN
1.0000 mg | INTRAMUSCULAR | Status: DC | PRN
  Administered 2013-11-05 – 2013-11-08 (×7): 1 mg via INTRAVENOUS
  Filled 2013-11-05 (×7): qty 1

## 2013-11-05 MED ORDER — FLUTICASONE PROPIONATE HFA 44 MCG/ACT IN AERO
1.0000 | INHALATION_SPRAY | Freq: Two times a day (BID) | RESPIRATORY_TRACT | Status: DC
Start: 2013-11-05 — End: 2013-11-08
  Administered 2013-11-05 – 2013-11-08 (×6): 1 via RESPIRATORY_TRACT
  Filled 2013-11-05: qty 10.6

## 2013-11-05 MED ORDER — ROPINIROLE HCL 1 MG PO TABS
1.0000 mg | ORAL_TABLET | Freq: Every day | ORAL | Status: DC
  Administered 2013-11-05 – 2013-11-07 (×3): 1 mg via ORAL
  Filled 2013-11-05 (×4): qty 1

## 2013-11-05 MED ORDER — ONDANSETRON HCL 4 MG/2ML IJ SOLN
4.0000 mg | Freq: Once | INTRAMUSCULAR | Status: AC
  Administered 2013-11-05: 4 mg via INTRAVENOUS
  Filled 2013-11-05: qty 2

## 2013-11-05 MED ORDER — FENTANYL CITRATE 0.05 MG/ML IJ SOLN
100.0000 ug | Freq: Once | INTRAMUSCULAR | Status: AC
  Administered 2013-11-05: 100 ug via INTRAVENOUS
  Filled 2013-11-05: qty 2

## 2013-11-05 MED ORDER — PROMETHAZINE HCL 25 MG PO TABS
25.0000 mg | ORAL_TABLET | Freq: Four times a day (QID) | ORAL | Status: DC | PRN
  Filled 2013-11-05: qty 1

## 2013-11-05 MED ORDER — ENOXAPARIN SODIUM 40 MG/0.4ML ~~LOC~~ SOLN
40.0000 mg | SUBCUTANEOUS | Status: DC
  Administered 2013-11-05 – 2013-11-08 (×4): 40 mg via SUBCUTANEOUS
  Filled 2013-11-05 (×5): qty 0.4

## 2013-11-05 MED ORDER — CYCLOBENZAPRINE HCL 10 MG PO TABS
10.0000 mg | ORAL_TABLET | Freq: Three times a day (TID) | ORAL | Status: DC | PRN
  Administered 2013-11-06 – 2013-11-08 (×3): 10 mg via ORAL
  Filled 2013-11-05 (×4): qty 1

## 2013-11-05 MED ORDER — LEVETIRACETAM 750 MG PO TABS
750.0000 mg | ORAL_TABLET | Freq: Two times a day (BID) | ORAL | Status: DC
  Administered 2013-11-05 – 2013-11-08 (×7): 750 mg via ORAL
  Filled 2013-11-05 (×9): qty 1

## 2013-11-05 MED ORDER — LORATADINE 10 MG PO TABS
10.0000 mg | ORAL_TABLET | Freq: Every day | ORAL | Status: DC
  Administered 2013-11-06 – 2013-11-08 (×3): 10 mg via ORAL
  Filled 2013-11-05 (×5): qty 1

## 2013-11-05 MED ORDER — ONDANSETRON HCL 4 MG PO TABS: 4.0000 mg | ORAL_TABLET | Freq: Four times a day (QID) | ORAL | Status: DC | PRN

## 2013-11-05 MED ORDER — HYDROMORPHONE HCL PF 1 MG/ML IJ SOLN
1.0000 mg | Freq: Once | INTRAMUSCULAR | Status: AC
  Administered 2013-11-05: 1 mg via INTRAVENOUS
  Filled 2013-11-05: qty 1

## 2013-11-05 MED ORDER — SODIUM CHLORIDE 0.9 % IV BOLUS (SEPSIS)
500.0000 mL | Freq: Once | INTRAVENOUS | Status: AC
  Administered 2013-11-05: 500 mL via INTRAVENOUS

## 2013-11-05 MED ORDER — LIDOCAINE 5 % EX PTCH
2.0000 | MEDICATED_PATCH | CUTANEOUS | Status: DC
  Filled 2013-11-05 (×4): qty 2

## 2013-11-05 MED ORDER — ALUM & MAG HYDROXIDE-SIMETH 200-200-20 MG/5ML PO SUSP: 30.0000 mL | Freq: Four times a day (QID) | ORAL | Status: DC | PRN

## 2013-11-05 MED ORDER — TRAZODONE HCL 100 MG PO TABS
200.0000 mg | ORAL_TABLET | Freq: Every day | ORAL | Status: DC
  Administered 2013-11-05 – 2013-11-07 (×3): 200 mg via ORAL
  Filled 2013-11-05 (×5): qty 2

## 2013-11-05 MED ORDER — IOHEXOL 300 MG/ML  SOLN
100.0000 mL | Freq: Once | INTRAMUSCULAR | Status: AC | PRN
  Administered 2013-11-05: 100 mL via INTRAVENOUS

## 2013-11-05 MED ORDER — ALPRAZOLAM 0.25 MG PO TABS: 0.2500 mg | ORAL_TABLET | Freq: Every day | ORAL | Status: DC | PRN

## 2013-11-05 MED ORDER — SODIUM CHLORIDE 0.9 % IV SOLN
INTRAVENOUS | Status: DC
  Administered 2013-11-05 – 2013-11-07 (×4): via INTRAVENOUS

## 2013-11-05 MED ORDER — POLYETHYLENE GLYCOL 3350 17 G PO PACK
17.0000 g | PACK | Freq: Every day | ORAL | Status: DC | PRN
  Filled 2013-11-05: qty 1

## 2013-11-05 MED ORDER — HYDROCODONE-ACETAMINOPHEN 5-325 MG PO TABS
1.0000 | ORAL_TABLET | ORAL | Status: DC | PRN
  Administered 2013-11-05: 1 via ORAL
  Administered 2013-11-06 – 2013-11-08 (×10): 2 via ORAL
  Filled 2013-11-05: qty 1
  Filled 2013-11-05 (×10): qty 2

## 2013-11-05 NOTE — ED Notes (Signed)
Patient laying in the bed. Thijs Brunton PA at the bedside upon arrival. C-Collar removed by PA. Patient made aware of plan of care. Patient made comfortable. Bactrim placed on neck where abrasion is located.

## 2013-11-05 NOTE — ED Notes (Signed)
Patient taken to CT.

## 2013-11-05 NOTE — Progress Notes (Signed)
  Echocardiogram 2D Echocardiogram has been performed.  Diamond Nickel 11/05/2013, 4:38 PM

## 2013-11-05 NOTE — ED Notes (Signed)
Terez Freimark PA at the bedside. Back board removed and posterior spine checked.

## 2013-11-05 NOTE — ED Notes (Signed)
Admitting MD at the bedside.  
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
## 2013-11-05 NOTE — ED Notes (Signed)
State Trooper and Phlebotomy at the bedside. Patient made comfortable.

## 2013-11-05 NOTE — ED Notes (Signed)
Patient returned from CT
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
## 2013-11-05 NOTE — ED Notes (Signed)
Patient taken to CT/Xray.

## 2013-11-05 NOTE — Progress Notes (Signed)
Patient ID: Kathryn [name], female   DOB: 1961/05/08, 53 y.o.   MRN: [id number] T12 compression fracture is old, seen at least since 2008. Will defer consult unless clinical concerns, or exam implies new neurologic problems.

## 2013-11-05 NOTE — ED Notes (Signed)
Patient given warm blanket and wrapped up. Patient is shivering.

## 2013-11-06 ENCOUNTER — Inpatient Hospital Stay (HOSPITAL_COMMUNITY): Payer: 59

## 2013-11-06 ENCOUNTER — Other Ambulatory Visit: Payer: Self-pay | Admitting: Physician Assistant

## 2013-11-06 ENCOUNTER — Encounter: Payer: Self-pay | Admitting: Physician Assistant

## 2013-11-06 DIAGNOSIS — G4700 Insomnia, unspecified: Secondary | ICD-10-CM

## 2013-11-06 DIAGNOSIS — J309 Allergic rhinitis, unspecified: Secondary | ICD-10-CM

## 2013-11-06 DIAGNOSIS — J45909 Unspecified asthma, uncomplicated: Secondary | ICD-10-CM

## 2013-11-06 DIAGNOSIS — R55 Syncope and collapse: Secondary | ICD-10-CM

## 2013-11-06 DIAGNOSIS — T148XXA Other injury of unspecified body region, initial encounter: Secondary | ICD-10-CM

## 2013-11-06 DIAGNOSIS — I959 Hypotension, unspecified: Secondary | ICD-10-CM

## 2013-11-06 DIAGNOSIS — M810 Age-related osteoporosis without current pathological fracture: Secondary | ICD-10-CM

## 2013-11-06 LAB — CK: Total CK: 108 U/L (ref 7–177)

## 2013-11-06 LAB — CORTISOL: Cortisol, Plasma: 3.2 ug/dL

## 2013-11-06 MED ORDER — COSYNTROPIN 0.25 MG IJ SOLR
0.2500 mg | Freq: Once | INTRAMUSCULAR | Status: AC
  Administered 2013-11-07: 0.25 mg via INTRAVENOUS
  Filled 2013-11-06: qty 0.25
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
## 2013-11-06 NOTE — Progress Notes (Signed)
UR Completed [name], RN,BSN [phone]  

## 2013-11-06 NOTE — Progress Notes (Signed)
Appreciate Dr. Lacy Duverney evaluation. Agree. Georganna Skeans, MD, MPH, FACS Trauma: [phone] General Surgery: [phone]
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
## 2013-11-06 NOTE — Progress Notes (Addendum)
Per Dr. Johnsie Cancel, patient needs 3 week event monitor for syncope and then followup in office with Dr. Tamala Julian after the event monitor. (Patient requests Dr. Tamala Julian - he sees her mother.) Will route to [facility] in scheduling per his request to help arrange this. Adib Wahba PA-C
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
## 2013-11-07 ENCOUNTER — Telehealth: Payer: Self-pay | Admitting: *Deleted

## 2013-11-07 ENCOUNTER — Inpatient Hospital Stay (HOSPITAL_COMMUNITY): Payer: 59

## 2013-11-07 DIAGNOSIS — G2581 Restless legs syndrome: Secondary | ICD-10-CM

## 2013-11-07 DIAGNOSIS — E041 Nontoxic single thyroid nodule: Secondary | ICD-10-CM

## 2013-11-07 LAB — URINE CULTURE: Colony Count: 40000

## 2013-11-07 LAB — CBC
HCT: 31.7 % — ABNORMAL LOW (ref 36.0–46.0)
Hemoglobin: 10.6 g/dL — ABNORMAL LOW (ref 12.0–15.0)
MCH: 31.9 pg (ref 26.0–34.0)
MCHC: 33.4 g/dL (ref 30.0–36.0)
MCV: 95.5 fL (ref 78.0–100.0)
Platelets: 128 10*3/uL — ABNORMAL LOW (ref 150–400)
RBC: 3.32 MIL/uL — ABNORMAL LOW (ref 3.87–5.11)
RDW: 12.9 % (ref 11.5–15.5)
WBC: 5 10*3/uL (ref 4.0–10.5)

## 2013-11-07 LAB — URINALYSIS, ROUTINE W REFLEX MICROSCOPIC
Bilirubin Urine: NEGATIVE
Glucose, UA: NEGATIVE mg/dL
Hgb urine dipstick: NEGATIVE
Ketones, ur: NEGATIVE mg/dL
Leukocytes, UA: NEGATIVE
Nitrite: NEGATIVE
Protein, ur: NEGATIVE mg/dL
Specific Gravity, Urine: 1.007 (ref 1.005–1.030)
Urobilinogen, UA: 0.2 mg/dL (ref 0.0–1.0)
pH: 6.5 (ref 5.0–8.0)

## 2013-11-07 LAB — LACTIC ACID, PLASMA: Lactic Acid, Venous: 0.5 mmol/L (ref 0.5–2.2)

## 2013-11-07 MED ORDER — LEVOFLOXACIN 750 MG PO TABS
750.0000 mg | ORAL_TABLET | Freq: Every day | ORAL | Status: DC
  Administered 2013-11-07 – 2013-11-08 (×2): 750 mg via ORAL
  Filled 2013-11-07 (×2): qty 1

## 2013-11-07 NOTE — Progress Notes (Signed)
   SUBJECTIVE:  Very sore.  Chest sore.  No cardiac complaints.   PHYSICAL EXAM Filed Vitals:   11/06/13 2039 11/06/13 2151 11/07/13 0532 11/07/13 0646  BP: 99/55  139/80   Pulse: 85  95   Temp: 98.8 F (37.1 C)  100.5 F (38.1 C) 99.7 F (37.6 C)  TempSrc: Oral  Oral Oral  Resp: 18  18   Height:      Weight:      SpO2: 93% 97% 93%    General:  No distress Lungs:  Clear Heart:  RRR, no rub Abdomen:  Positive bowel sounds, no rebound no guarding Extremities:  Mild hand edema   LABS: No results found for this basename: TROPONINI   Results for orders placed during the hospital encounter of 11/05/13 (from the past 24 hour(s))  LACTIC ACID, PLASMA     Status: None   Collection Time    11/07/13  8:00 AM      Result Value Ref Range   Lactic Acid, Venous 0.5  0.5 - 2.2 mmol/L  CBC     Status: Abnormal   Collection Time    11/07/13  8:15 AM      Result Value Ref Range   WBC 5.0  4.0 - 10.5 K/uL   RBC 3.32 (*) 3.87 - 5.11 MIL/uL   Hemoglobin 10.6 (*) 12.0 - 15.0 g/dL   HCT 31.7 (*) 36.0 - 46.0 %   MCV 95.5  78.0 - 100.0 fL   MCH 31.9  26.0 - 34.0 pg   MCHC 33.4  30.0 - 36.0 g/dL   RDW 12.9  11.5 - 15.5 %   Platelets 128 (*) 150 - 400 K/uL    Intake/Output Summary (Last 24 hours) at 11/07/13 1025 Last data filed at 11/07/13 0659  Gross per 24 hour  Intake   2400 ml  Output    800 ml  Net   1600 ml     ASSESSMENT AND PLAN:  SYNCOPE:  No clear etiology.  Cardiac work up has been negative and telemetry normal over night.  I have asked our staff to get an event monitor.  TYSHIKA BALDRIDGE will need a 21 day event monitor.  The patients symptoms necessitate an event monitor.  The symptoms are too infrequent to be identified on a Holter monitor.    I have asked for follow up with Dr. Tamala Julian after this.     Jeneen Rinks [facility] 11/07/2013 10:25 AM

## 2013-11-07 NOTE — Telephone Encounter (Signed)
Referral order placed.
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
## 2013-11-07 NOTE — Progress Notes (Signed)
Came to visit patient on behalf of [employer] program for [employer] employees/dependents with [employer]. Patient reports she is having pain and nausea. Nursing already aware. Left brochure and contact information regarding [employer] program if she should have any needs.  Marthenia Rolling, MSN- [facility] [phone]
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
## 2013-11-07 NOTE — Telephone Encounter (Signed)
Message copied by Pearletha Forge on Fri Nov 07, 2013 11:44 AM ------      Message from: Rosine Abe      Created: Fri Nov 07, 2013 10:49 AM       It looks like she had thyroid ultrasound already.  Please don't reorder but proceed with referral to endocrinologist.            RY ------

## 2013-11-07 NOTE — Progress Notes (Signed)
Patient with new s/s of infection at 0530 this AM, consisting of fever (T 100.5* F orally), chills, flushing, diaphoresis and general malaise with global weakness. Patient also complains of severe headache (unchanged finding from previous assessments overnight).  WBC on admission WDL. No previous elevated temps. SBP has historically been 90 - 105, now 130s. NSR on telemetry, rate in 90's (grossly unchanged from previous values). No respiratory complaints overnight, however with episodic desaturations to 79 - 84 range during sleep after patient removal of nasal canula with brisk return to baseline (94 - 99%) on replacement of O2 at 2L/m via nasal canula. CT-chest with contrast yesterday showed soft tissue swelling over right chest wall with no acute findings to suggest infective processes. No urinary complaints.  Urine culture pending from admission labs. ACTH stimulation testing in process.  Notified NP on-call, Chaney Malling of new findings. Concern for new infection/early sepsis of unknown etiology. MD to address on AM rounds.  Fever and pain treated with hydrocodone 5/325mg  2 tabs PO. Will recheck temp in 1 hour. Continuing to monitor.

## 2013-11-08 DIAGNOSIS — F3289 Other specified depressive episodes: Secondary | ICD-10-CM

## 2013-11-08 DIAGNOSIS — F329 Major depressive disorder, single episode, unspecified: Secondary | ICD-10-CM

## 2013-11-08 LAB — URINE CULTURE: Colony Count: 75000

## 2013-11-08 LAB — ACTH STIMULATION, 3 TIME POINTS
Cortisol, 30 Min: 20.8 ug/dL (ref >20.0)
Cortisol, 60 Min: 22.9 ug/dL (ref >20)
Cortisol, Base: 15 ug/dL

## 2013-11-08 MED ORDER — CYCLOBENZAPRINE HCL 10 MG PO TABS: 10.0000 mg | ORAL_TABLET | Freq: Three times a day (TID) | ORAL | Status: DC

## 2013-11-08 MED ORDER — ONDANSETRON 4 MG PO TBDP: 4.0000 mg | ORAL_TABLET | Freq: Three times a day (TID) | ORAL | Status: DC | PRN

## 2013-11-08 MED ORDER — LIDOCAINE 5 % EX PTCH: 2.0000 | MEDICATED_PATCH | CUTANEOUS | Status: DC

## 2013-11-08 MED ORDER — POLYETHYLENE GLYCOL 3350 17 G PO PACK: 17.0000 g | PACK | Freq: Every day | ORAL | Status: DC | PRN

## 2013-11-08 MED ORDER — SENNOSIDES-DOCUSATE SODIUM 8.6-50 MG PO TABS: 1.0000 | ORAL_TABLET | Freq: Two times a day (BID) | ORAL | Status: DC | PRN

## 2013-11-08 MED ORDER — LEVOFLOXACIN 750 MG PO TABS: 750.0000 mg | ORAL_TABLET | Freq: Every day | ORAL | Status: DC

## 2013-11-08 MED ORDER — HYDROCODONE-ACETAMINOPHEN 5-325 MG PO TABS: 1.0000 | ORAL_TABLET | ORAL | Status: DC | PRN

## 2013-11-08 MED ORDER — HYDROMORPHONE HCL 2 MG PO TABS
2.0000 mg | ORAL_TABLET | Freq: Once | ORAL | Status: AC
  Administered 2013-11-08: 2 mg via ORAL
  Filled 2013-11-08: qty 1
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
## 2013-11-08 NOTE — Progress Notes (Signed)
PT Cancellation Note  Patient Details Name: Kathryn [name] MRN: [id number] DOB: 1961-05-15   Cancelled Treatment:    Reason Eval/Treat Not Completed: Other (comment), pt deferred PT today as she is set to d/c home and f/u with HHPT. Pt also requests [facility] for home for mgmt of back pain with daily activity.   [place], [place] 11/08/2013, 12:14 PM

## 2013-11-08 NOTE — ED Provider Notes (Signed)
Medical screening examination/treatment/procedure(s) were conducted as a shared visit with non-physician practitioner(s) and myself.  I personally evaluated the patient during the encounter.   EKG Interpretation   Date/Time:  Wednesday November 05 2013 08:11:21 EST Ventricular Rate:  57 PR Interval:  155 QRS Duration: 83 QT Interval:  431 QTC Calculation: 420 R Axis:   48 Text Interpretation:  Age not entered, assumed to be  53 years old for  purpose of ECG interpretation Sinus rhythm Low voltage, precordial leads  Confirmed by Zenia Resides  MD, Carzell Saldivar ([id number]) on 11/05/2013 1:36:14 PM       Kathryn Jacobsen, MD 11/08/13 1450

## 2013-11-08 NOTE — Progress Notes (Signed)
Utilization Review Completed.   Nafisah Runions, RN, BSN Nurse Case Manager  

## 2013-11-08 NOTE — Progress Notes (Signed)
   CARE MANAGEMENT NOTE 11/08/2013  Patient:  Kathryn [name], Kathryn [name]   Account Number:  [id number]  Date Initiated:  11/08/2013  Documentation initiated by:  [facility]  Subjective/Objective Assessment:   adm: Syncope: Unclear etiology     Action/Plan:   discharge planning   Anticipated DC Date:  11/08/2013   Anticipated DC Plan:  [place]  CM consult      [facility] Choice  HOME HEALTH   Choice offered to / List presented to:  C-1 Patient           Status of service:  In process, will continue to follow Medicare Important Message given?   (If response is "NO", the following Medicare IM given date fields will be blank) Date Medicare IM given:   Date Additional Medicare IM given:    Discharge Disposition:    Per UR Regulation:    If discussed at Long Length of Stay Meetings, dates discussed:    Comments:  11/08/13 17:40 CM called RN to se if pt had made choice for [facility] agency and RN states spouse will not be up to room until after 19:00.  CM will check in the am and will follow for disposition.  Mariane Masters, BSN, CM [phone].   09:35 CM gave pt a list of [facility] agencies for choice and pt states she will discuss with spouse.  Will wait for choice. Mariane Masters, BSN, CM [phone].

## 2013-11-08 NOTE — Progress Notes (Signed)
Discharge instructions reviewed with patient and included the following:  medication list, follow up appointments, physical limitations, and pain management.  Comprehension of information ascertained via "teach back" method.

## 2013-11-10 NOTE — Progress Notes (Signed)
11/06/13 1256  PT G-Codes **NOT FOR INPATIENT CLASS**  Functional Assessment Tool Used assist level  Functional Limitation Mobility: Walking and moving around  Mobility: Walking and Moving Around Current Status (F6812) CI  Mobility: Walking and Moving Around Goal Status [phone]) CH  late g-code entry for 11/06/13 evaluation.   Barbarann Ehlers [place], [place], DPT [phone]

## 2013-11-11 LAB — FOLATE: Folate: 17.4 ng/mL

## 2013-11-11 LAB — TSH: TSH: 0.752 u[IU]/mL (ref 0.350–4.500)

## 2013-11-11 LAB — T3: T3, Total: 79.2 ng/dl — ABNORMAL LOW (ref 80.0–204.0)

## 2013-11-11 LAB — T4, FREE: Free T4: 1.1 ng/dL (ref 0.80–1.80)

## 2013-11-11 LAB — VITAMIN B12: Vitamin B-12: 503 pg/mL (ref 211–911)

## 2013-11-11 NOTE — Care Management (Signed)
11-11-13 1610 Late Entry: CM did place call to pt and she was vague with needs. Pt stated to call her husband and he stated he did not know anything about her care and referred me back to pt. CM did call back and pt hung the phone up. No HH services set up at this time. PER PT no HH PT was needed. Bethena Roys, RN,BSN [phone]

## 2013-11-13 ENCOUNTER — Telehealth: Payer: Self-pay | Admitting: Internal Medicine

## 2013-11-13 ENCOUNTER — Other Ambulatory Visit: Payer: Self-pay | Admitting: Internal Medicine

## 2013-11-13 LAB — CULTURE, BLOOD (ROUTINE X 2)
Culture: NO GROWTH
Culture: NO GROWTH

## 2013-11-13 NOTE — Telephone Encounter (Signed)
Pt is needing new rx HYDROcodone-acetaminophen (NORCO/VICODIN) 5-325 MG per tablet °Please call when available for pick up °

## 2013-11-14 MED ORDER — HYDROCODONE-ACETAMINOPHEN 5-325 MG PO TABS: 1.0000 | ORAL_TABLET | ORAL | Status: DC | PRN

## 2013-11-14 NOTE — Telephone Encounter (Signed)
yes

## 2013-11-14 NOTE — Telephone Encounter (Signed)
rx up front for p/u, pt aware

## 2013-11-14 NOTE — Telephone Encounter (Signed)
Would you sign for Dr Shawna Orleans? He is out of the office this week

## 2013-11-25 ENCOUNTER — Other Ambulatory Visit: Payer: Self-pay

## 2013-11-25 DIAGNOSIS — R55 Syncope and collapse: Secondary | ICD-10-CM

## 2013-11-27 ENCOUNTER — Encounter: Payer: Self-pay | Admitting: *Deleted

## 2013-11-27 ENCOUNTER — Encounter (INDEPENDENT_AMBULATORY_CARE_PROVIDER_SITE_OTHER): Payer: 59

## 2013-11-27 DIAGNOSIS — R55 Syncope and collapse: Secondary | ICD-10-CM

## 2013-11-27 NOTE — Progress Notes (Signed)
Patient ID: Kathryn [name], female   DOB: June 22, 1961, 53 y.o.   MRN: [id number] [employer] 30 day cardiac event monitor applied to patient.

## 2013-12-09 ENCOUNTER — Encounter: Payer: Self-pay | Admitting: Internal Medicine

## 2013-12-09 ENCOUNTER — Ambulatory Visit (INDEPENDENT_AMBULATORY_CARE_PROVIDER_SITE_OTHER): Payer: 59 | Admitting: Internal Medicine

## 2013-12-09 VITALS — BP 114/68 | HR 83 | Temp 98.0°F | Resp 12 | Ht 64.0 in | Wt 174.0 lb

## 2013-12-09 DIAGNOSIS — E042 Nontoxic multinodular goiter: Secondary | ICD-10-CM

## 2013-12-09 NOTE — Patient Instructions (Signed)
We will call you with the schedule for the Barium swallow >> I will let you know the results through [place]. Please come back for a follow-up appointment in 4 months.
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
## 2013-12-10 ENCOUNTER — Telehealth: Payer: Self-pay | Admitting: Internal Medicine

## 2013-12-10 NOTE — Telephone Encounter (Signed)
Called pt and advised her per Dr Gherghe's note. Pt understood.  

## 2013-12-10 NOTE — Telephone Encounter (Signed)
I can check antibodies, but unclear why needed since her TFTs are normal. Also, for her FNA >> we are waiting for a Ba swallow first (pending), and will discuss more when she comes back in 4 mo. We do the FNAs downstairs in [place].

## 2013-12-10 NOTE — Telephone Encounter (Signed)
Pt would like to know if Dr. Cruzita Lederer can do a Antibody test for the thyroid  Fine needle Aspiration? Patient was instructed to ask  Please advise patient with information   Call back:[phone]  Thank You  :)

## 2013-12-10 NOTE — Telephone Encounter (Signed)
Read note below and advise.  

## 2013-12-16 NOTE — Telephone Encounter (Signed)
Pt called requesting follow up/results from her barium swallow. Please advise. Thank you.

## 2013-12-16 NOTE — Telephone Encounter (Signed)
Pt needs followup on the esophagus swallow, she has not heard from anyone about this

## 2013-12-16 NOTE — Telephone Encounter (Signed)
This was ordered >> Kathryn [name] pcan you please check on the status of the order?

## 2013-12-18 ENCOUNTER — Other Ambulatory Visit (HOSPITAL_COMMUNITY): Payer: Self-pay

## 2013-12-18 DIAGNOSIS — R55 Syncope and collapse: Secondary | ICD-10-CM

## 2013-12-18 NOTE — Telephone Encounter (Signed)
Mary, do you know if the DG Swallow (Barium swallow) has been authorized yet? Please advise. Thank you.

## 2013-12-19 NOTE — Telephone Encounter (Signed)
Hi,  Patient calling again for an update on the barium swallow. I informed the pt that we were awaiting word for approval for it. She is requesting a urgent response for scheduling this appt. Thank you!

## 2013-12-19 NOTE — Telephone Encounter (Signed)
Pt schedule for 12/23/13 @ 830am /815am arrival at [facility], pt is aware of appt date time and location

## 2013-12-22 ENCOUNTER — Ambulatory Visit (HOSPITAL_COMMUNITY)
Admission: RE | Admit: 2013-12-22 | Discharge: 2013-12-22 | Disposition: A | Discharge status: Home / Self Care | Payer: 59 | Source: Ambulatory Visit | Attending: Internal Medicine | Admitting: Internal Medicine

## 2013-12-22 DIAGNOSIS — R1310 Dysphagia, unspecified: Secondary | ICD-10-CM | POA: Insufficient documentation

## 2013-12-22 DIAGNOSIS — K449 Diaphragmatic hernia without obstruction or gangrene: Secondary | ICD-10-CM | POA: Insufficient documentation

## 2013-12-22 DIAGNOSIS — K219 Gastro-esophageal reflux disease without esophagitis: Secondary | ICD-10-CM | POA: Insufficient documentation

## 2013-12-23 ENCOUNTER — Encounter: Payer: 59 | Admitting: Interventional Cardiology

## 2013-12-29 ENCOUNTER — Other Ambulatory Visit: Payer: Self-pay | Admitting: Family Medicine

## 2014-01-06 ENCOUNTER — Telehealth: Payer: Self-pay

## 2014-01-06 NOTE — Telephone Encounter (Signed)
pt aware of monitor results.no pathologic or serious rhythm.abnormality frequent sinus tach.symptom do not correlate with rhythm disturbance.no explanantion for syncope.pt adv to keep 01/2014 appt with Dr.Smith.pt verbalized understanding.

## 2014-01-28 ENCOUNTER — Ambulatory Visit (HOSPITAL_COMMUNITY)
Admission: RE | Admit: 2014-01-28 | Discharge: 2014-01-28 | Disposition: A | Discharge status: Home / Self Care | Payer: 59 | Source: Ambulatory Visit | Attending: Neurology | Admitting: Neurology

## 2014-01-28 DIAGNOSIS — R55 Syncope and collapse: Secondary | ICD-10-CM | POA: Insufficient documentation

## 2014-01-28 NOTE — Procedures (Signed)
History: 53 year old female being evaluated for syncope  Sedation: None  Technique: This is a 17 channel routine scalp EEG performed at the bedside with bipolar and monopolar montages arranged in accordance to the international 10/20 system of electrode placement. One channel was dedicated to EKG recording.    Background: The background consists predominantly of intermixed alpha and beta activities. There is a well defined posterior dominant rhythm of 10 Hz that attenuates with eye opening. There is irregular delta and some theta activity is present at [facility] greater than T7. This is not rhythmic and there are no sharp waves recorded  Photic stimulation: Physiologic driving is now performed  EEG Abnormalities: 1) left anterior temporal slow activity  Clinical Interpretation: This EEG did not record any specific indicators of a seizure predisposition, however there was a finding suggesting a cerebral dysfunction in the left anterior temporal region. Recommend correlation with MRI.  Roland Rack, MD [facility] [phone]  If 7pm- 7am, please page neurology on call as listed in [place].

## 2014-01-28 NOTE — Progress Notes (Signed)
EEG completed; results pending.    

## 2014-02-03 ENCOUNTER — Other Ambulatory Visit (HOSPITAL_COMMUNITY): Payer: Self-pay

## 2014-02-03 ENCOUNTER — Encounter: Payer: Self-pay | Admitting: Interventional Cardiology

## 2014-02-03 ENCOUNTER — Ambulatory Visit (INDEPENDENT_AMBULATORY_CARE_PROVIDER_SITE_OTHER): Payer: 59 | Admitting: Interventional Cardiology

## 2014-02-03 VITALS — BP 110/78 | HR 92 | Ht 64.0 in | Wt 176.0 lb

## 2014-02-03 DIAGNOSIS — R9401 Abnormal electroencephalogram [EEG]: Secondary | ICD-10-CM | POA: Insufficient documentation

## 2014-02-03 DIAGNOSIS — R55 Syncope and collapse: Secondary | ICD-10-CM

## 2014-02-03 DIAGNOSIS — Z820 Family history of epilepsy and other diseases of the nervous system: Secondary | ICD-10-CM

## 2014-02-03 NOTE — Patient Instructions (Signed)
Your physician recommends that you continue on your current medications as directed. Please refer to the Current Medication list given to you today.   Your physician recommends that you schedule a follow-up appointment as needed  
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
## 2014-02-04 ENCOUNTER — Other Ambulatory Visit: Payer: Self-pay | Admitting: Neurology

## 2014-02-04 DIAGNOSIS — R519 Headache, unspecified: Secondary | ICD-10-CM

## 2014-02-04 DIAGNOSIS — R51 Headache: Secondary | ICD-10-CM

## 2014-02-04 DIAGNOSIS — R4020 Unspecified coma: Secondary | ICD-10-CM

## 2014-02-09 ENCOUNTER — Other Ambulatory Visit: Payer: 59

## 2014-02-11 ENCOUNTER — Telehealth: Payer: Self-pay | Admitting: *Deleted

## 2014-02-11 ENCOUNTER — Ambulatory Visit (HOSPITAL_COMMUNITY)
Admission: RE | Admit: 2014-02-11 | Discharge: 2014-02-11 | Disposition: A | Discharge status: Home / Self Care | Payer: 59 | Source: Ambulatory Visit | Attending: Neurology | Admitting: Neurology

## 2014-02-11 DIAGNOSIS — R9401 Abnormal electroencephalogram [EEG]: Secondary | ICD-10-CM | POA: Insufficient documentation

## 2014-02-11 DIAGNOSIS — R404 Transient alteration of awareness: Secondary | ICD-10-CM | POA: Insufficient documentation

## 2014-02-11 NOTE — Procedures (Signed)
EEG report.  Brief clinical history:53 years old female who sustained an isolated episode of brief alteration of consciousness. Differential diagnosis is syncope versus seizures. Technique: this is a 19 channel routine scalp EEG performed at the bedside with bipolar and monopolar montages arranged in accordance to the international 10/20 system of electrode placement. One channel was dedicated to EKG recording.  The study was performed during wakefulness and drowsiness. Hyperventilation and intermittent photic stimulation were utilized as activating procedures.  Description:In the wakeful state, the best background consisted of a low to medium amplitude, posterior dominant, well sustained, symmetric and reactive 12 Hz rhythm. Drowsiness demonstrated dropout of the alpha rhythm. Hyperventilation induced mild, diffuse, asymmetric theta slowing but no frank epileptiform discharges. Intermittent photic stimulation did induce a normal driving response.  No focal or generalized epileptiform discharges noted.  Infrequent, mild, intermittent left anterior temporal theta slowing noted. EKG showed sinus rhythm.  Impression: this is an abnormal awake and drowsy EEG because of the presence of mild intermittent theta slowing involving the left anterior temporal region. Overall, the findings are indicative of focal neuronal dysfunction affecting the left anterior temporal region. If clinically indicated, a 24 or 48 hour ambulatory EEG could provide further and more definite information regarding the above described findings. Clinical correlation is advised.  Dorian Pod, MD

## 2014-02-11 NOTE — Telephone Encounter (Signed)
Did hormone test just want to know if you receive it and what do she need to do because they abnormal  testosterone was over 3800 PG per ML

## 2014-02-11 NOTE — Progress Notes (Signed)
EEG Completed; Results Pending  

## 2014-02-11 NOTE — Telephone Encounter (Signed)
Please read note below and advise.  

## 2014-02-11 NOTE — Telephone Encounter (Signed)
I will definitely need to see the result. Will she send it to me? This is not in Epic.

## 2014-02-12 ENCOUNTER — Other Ambulatory Visit (HOSPITAL_COMMUNITY): Payer: Self-pay

## 2014-02-12 DIAGNOSIS — R9401 Abnormal electroencephalogram [EEG]: Secondary | ICD-10-CM

## 2014-02-12 NOTE — Telephone Encounter (Signed)
Results in and given to Dr Cruzita Lederer.

## 2014-02-12 NOTE — Telephone Encounter (Signed)
Called pt and she said she would email them to [place]. Be advised.

## 2014-02-12 NOTE — Telephone Encounter (Signed)
I reviewed the labs and would like to see her for a visit to discuss further. Can we schedule next Friday or Thursday?

## 2014-02-13 ENCOUNTER — Telehealth: Payer: Self-pay | Admitting: Internal Medicine

## 2014-02-13 NOTE — Telephone Encounter (Signed)
Called pt and scheduled a follow up appt to discuss lab results.

## 2014-02-13 NOTE — Telephone Encounter (Signed)
Patient states she sent Dr. Ena Dawley nurse a email and no response   Please advise patient   Thank You

## 2014-02-20 ENCOUNTER — Encounter: Payer: Self-pay | Admitting: Internal Medicine

## 2014-02-20 ENCOUNTER — Ambulatory Visit (INDEPENDENT_AMBULATORY_CARE_PROVIDER_SITE_OTHER): Payer: 59 | Admitting: Internal Medicine

## 2014-02-20 ENCOUNTER — Ambulatory Visit (HOSPITAL_COMMUNITY): Payer: 59

## 2014-02-20 ENCOUNTER — Ambulatory Visit (HOSPITAL_COMMUNITY)
Admission: RE | Admit: 2014-02-20 | Discharge: 2014-02-20 | Disposition: A | Discharge status: Home / Self Care | Payer: 59 | Source: Ambulatory Visit | Attending: Neurology | Admitting: Neurology

## 2014-02-20 VITALS — BP 112/68 | HR 66 | Temp 97.8°F | Resp 12 | Wt 176.0 lb

## 2014-02-20 DIAGNOSIS — R51 Headache: Secondary | ICD-10-CM | POA: Insufficient documentation

## 2014-02-20 DIAGNOSIS — R4020 Unspecified coma: Secondary | ICD-10-CM

## 2014-02-20 DIAGNOSIS — R404 Transient alteration of awareness: Secondary | ICD-10-CM | POA: Insufficient documentation

## 2014-02-20 DIAGNOSIS — E280 Estrogen excess: Secondary | ICD-10-CM | POA: Insufficient documentation

## 2014-02-20 DIAGNOSIS — E349 Endocrine disorder, unspecified: Secondary | ICD-10-CM

## 2014-02-20 DIAGNOSIS — R519 Headache, unspecified: Secondary | ICD-10-CM

## 2014-02-20 DIAGNOSIS — R7989 Other specified abnormal findings of blood chemistry: Secondary | ICD-10-CM | POA: Insufficient documentation

## 2014-02-20 LAB — CORTISOL: Cortisol, Plasma: 22.1 ug/dL

## 2014-02-20 LAB — FOLLICLE STIMULATING HORMONE: FSH: 75.7 m[IU]/mL

## 2014-02-20 LAB — LUTEINIZING HORMONE: LH: 31.89 m[IU]/mL

## 2014-02-20 NOTE — Progress Notes (Signed)
Patient ID: Kathryn [name], female   DOB: 1960-12-10, 53 y.o.   MRN: [id number]   HPI  Kathryn [name] is a 53 y.o.-year-old female, whom I originally saw for MNG, now presenting for high testosterone, estradiol and progesterone determined by an outside lab.  I reviewed the lab records she brought ([facility]): - 01/25/2011:  Estradiol 12.74 pg/mL (1-3.2) Progesterone 1968.41 pg/mL (18-126) Testosterone 394.28 pg/ml (6.1-49) DHEA 614.61 pg/mL (106-300) a.m. Cortisol 12.589 nmol/L (5.1-40.2) Nighttime cortisol 1.59 nmol/L (0.9-9.2) - 01/13/2014: Estradiol 8.65 pg/mL (1-3.2) Progesterone 307.97 pg/mL (18-126) Testosterone 3899.32 pg/ml (6.1-49) DHEA 176.52 pg/mL (106-300) a.m. Cortisol 32 nmol/L (5.1-40.2)  An abdominal CT scan from 2011 showed no adrenal abnormality.  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 She repeated the labs this year >> T higher. She now works with nystatin, hydrocortisone, metoprolol, etc.  No herbal supplements, husband not using testosterone.  She had endometrial cancer >> TAH + BSO in 2003. Until then, her menses were regular.  She does not feel her voice has deepened or that her muscles have increased. 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 She feels that her clitoris has enlarged.    She feels her body temp is low: 97-97.85F.  ROS: Constitutional: no weight loss,gain, + fatigue, + hot flushes, + poor sleep Eyes: no blurry vision, no xerophthalmia ENT: no sore throat, no nodules palpated in throat, no dysphagia/no odynophagia, no hoarseness Cardiovascular: no CP/SOB/no palpitations/leg swelling Respiratory: no cough/SOB Gastrointestinal: + N/+ V/D/C Musculoskeletal: + muscle aches/+ joint aches Skin: no rashes, + hair loss Neurological: no  tremors/numbness/tingling/dizziness, + HA. She has a migraine this am >> she vomited her food  I reviewed pt's medications, allergies, PMH, social hx, family hx and no changes required, except as mentioned above.  PE: BP 112/68  Pulse 66  Temp(Src) 97.8 F (36.6 C) (Oral)  Resp 12  Wt 176 lb (79.833 kg)  SpO2 96%  LMP 09/11/2001 Wt Readings from Last 3 Encounters:  02/20/14 176 lb (79.833 kg)  02/03/14 176 lb (79.833 kg)  12/09/13 174 lb (78.926 kg)   Constitutional: overweight, in NAD Eyes: PERRLA, EOMI, no exophthalmos ENT: moist mucous membranes, + thyromegaly L=R, no cervical lymphadenopathy Cardiovascular: RRR, No MRG Respiratory: CTA B Gastrointestinal: abdomen soft, NT, ND, BS+ Musculoskeletal: no deformities, strength intact in all 4;  Skin: moist, warm, no rashes; female-pattern balding, but not exaggerated; no hirsutism, no acne Neurological: no tremor with outstretched hands, DTR normal in all 4  ASSESSMENT: 1. High testosterone, progesterone, and estradiol - CT abdomen 2011: no adrenal abnormality  PLAN: 1.  - I reviewed the labs from 2012 and 2015 along with the pt. The testosterone has increased in the last 3 years, while the progesterone, estradiol and DHEA has decreased. They are still markedly elevated. - we reviewed the images of her abdominal CT scan from 2011 together >> no adrenal abnormality is evident - I believe that her working with compounded sex hormones could have definitely caused the increase in hormones in 2012, but it is unusual to see such a high levels after she stopped compounding them. - no other obvious sources of testosterone, estrogen, etc. - we discussed to check her levels again by our lab and, if still elevated, will get a new adrenal CT  scan to evaluate for tumors. She had a BSO in 2003, so the hormones can only come from the adrenal or from exogenous sources. The estradiol can be formed int he adipose tissue from conversion of  testosterone but I doubt that this would cause such a large estradiol increase.  Office Visit on 02/20/2014  Component Date Value Ref Range Status  . Estradiol, Free 02/20/2014 0.29   Final   Comment: FEMALE REFERENCE RANGES FOR ESTRADIOL, FREE:                            Follicular Stage:  4.70-9.62 pg/mL                            Mid-cycle Stage:   0.72-5.89 pg/mL                            Luteal Stage:      0.40-5.55 pg/mL                            Postmenopausal:    < or = 0.38 pg/mL  . Estradiol 02/20/2014 16   Final   Comment: FEMALE REFERENCE RANGES FOR ESTRADIOL:                            Follicular Stage:  83-662 pg/mL                            Mid-cycle Stage:   94-762 pg/mL                            Luteal Stage:      48-440 pg/mL                            Postmenopausal:    < or = 10 pg/mL  . Results Received 02/20/2014 03/02/14   Final   Comment: Reference lab accession: [id number]                          Test performed by:                                     [facility]                                     [address] [place] [address]                                     Phone:  [phone]                          Director:  Orland Penman. Lucia Bitter, M.D.  . Testosterone 02/20/2014 41  10 - 70 ng/dL Final   Comment:           Tanner Stage       Female              Female                                        I              < 30 ng/dL        < 10 ng/dL                                        II             < 150 ng/dL       < 30 ng/dL                                        III            100-320 ng/dL     < 35 ng/dL                                        IV             200-970 ng/dL     15-40 ng/dL                                        V/Adult        300-890 ng/dL     10-70 ng/dL                             . Sex Hormone Binding 02/20/2014 61  18 - 114 nmol/L Final  . Testosterone, Free 02/20/2014  4.9  0.6 - 6.8 pg/mL Final   Comment:                            The concentration of free testosterone is derived from a mathematical                          expression based on constants for the binding of testosterone to sex                          hormone-binding globulin and albumin.  . Testosterone-% Free 02/20/2014 1.2  0.4 - 2.4 % Final  . 17-OH-Progesterone, LC/MS/MS 02/20/2014 50   Final   Comment:                            Adult Female Reference Ranges for                            17-Hydroxyprogesterone, LC/MS/MS:  Follicular Phase:       < or = 185 ng/dL                             Luteal Phase:           < or = 285 ng/dL                             Postmenopausal Phase:   < or = 45 ng/dL                             Pregnancy:                             First Trimester:  78-457 ng/dL                             Second Trimester: 90-357 ng/dL                             Third Trimester:  144-578 ng/dL  . Progesterone 02/20/2014 0.7   Final   Comment: Progesterone:                                   Adult female:                       0.3 -  1.2 ng/mL                                 Adult female:    Follicular         0.2 -  1.4 ng/mL                                                  Luteal             3.3 - 25.6 ng/mL                                                  Mid - Luteal       4.4 - 28.0 ng/mL                                                  Post menopausal    0.0 -  0.7 ng/mL                                                  1st trimester     11.2 - 90.0 ng/mL  2nd trimester     25.6 - 89.4 ng/mL                                                  3rd trimester     48.4 -422.5 ng/mL                             . DHEA-SO4 02/20/2014 159  35 - 430 ug/dL Final  . Androstenedione 02/20/2014 93   Final   Comment:                            Adult Female Reference Ranges  for                            Androstenedione, Serum:                                                        Follicular Phase:      41-962 ng/dL                             Luteal Phase:          30-235 ng/dL                             Postmenopausal Phase:  20-75  ng/dL  . [facility] 02/20/2014 31.89   Final   Comment: Female Reference Range:20-70 yrs     1.5-9.3 mIU/mL>70 yrs       3.1-35.6 mIU/mLFemale Reference Range:Follicular Phase     2.2-97.9 mIU/mLMidcycle             8.7-76.3 mIU/mLLuteal Phase         0.5-16.9 mIU/mL  Post Menopausal      15.9-54.0                           mIU/mLPregnant             <1.5 mIU/mLContraceptives       0.7-5.6 mIU/mL  . [facility] 02/20/2014 75.7   Final   Female Reference Range:  1.4-18.1 mIU/mLFemale Reference Range:Follicular Phase          2.5-10.2 mIU/mLMidCycle Peak          3.4-33.4 mIU/mLLuteal Phase          1.5-9.1 mIU/mLPost Menopausal     23.0-116.3 mIU/mLPregnant          <0.3 mIU/mL  . Cortisol, Plasma 02/20/2014 22.1   Final   AM:  4.3 - 22.4 ug/dLPM:  3.1 - 16.7 ug/dL  . G921 ACTH 02/20/2014 41  6 - 50 pg/mL Final   Comment: Reference range applies only to specimens                          collected between 7am-10am   All labs normal (pt menopausal) >> will advise her not to use the [facility] for labs.

## 2014-02-20 NOTE — Patient Instructions (Signed)
Please stop at the lab. I will send the results through [place].

## 2014-02-21 LAB — PROGESTERONE: Progesterone: 0.7 ng/mL

## 2014-02-23 LAB — TESTOSTERONE, FREE, TOTAL, SHBG
Sex Hormone Binding: 61 nmol/L (ref 18–114)
Testosterone, Free: 4.9 pg/mL (ref 0.6–6.8)
Testosterone-% Free: 1.2 % (ref 0.4–2.4)
Testosterone: 41 ng/dL (ref 10–70)

## 2014-02-23 LAB — DHEA-SULFATE: DHEA-SO4: 159 ug/dL (ref 35–430)

## 2014-02-23 LAB — ACTH: C206 ACTH: 41 pg/mL (ref 6–50)

## 2014-02-24 ENCOUNTER — Ambulatory Visit (HOSPITAL_COMMUNITY)
Admission: RE | Admit: 2014-02-24 | Discharge: 2014-02-24 | Disposition: A | Discharge status: Home / Self Care | Payer: 59 | Source: Ambulatory Visit | Attending: Neurology | Admitting: Neurology

## 2014-02-24 DIAGNOSIS — R9401 Abnormal electroencephalogram [EEG]: Secondary | ICD-10-CM | POA: Insufficient documentation

## 2014-02-24 NOTE — Progress Notes (Signed)
EEG Completed; Results Pending  

## 2014-02-24 NOTE — Procedures (Signed)
History: 53 yo F with   Sedation: None  Technique: This is a 17 channel routine scalp EEG performed at the bedside with bipolar and monopolar montages arranged in accordance to the international 10/20 system of electrode placement. One channel was dedicated to EKG recording.    Background: There is a well defined posterior dominant rhythm of 11 Hz that attenuates with eye opening. There is irregular and sometimes quasi-rhythmic delta activity seen at T7, P7. In addition to these runs of delta activity, there are rare sharp waves in the same distribution. Sleep is recorded with normal distribution of structures.   Photic stimulation: Physiologic driving is present  EEG Abnormalities: 1) left temporal sharp waves 2) left temporal delta activity.   Clinical Interpretation: This abnormal EEG is suggestive of a potential area of epileptogenicity in the left anterior temporal region. There was no seizure recorded on this study.   Roland Rack, MD [facility] [phone]  If 7pm- 7am, please page neurology on call as listed in [place].

## 2014-02-25 LAB — 17-HYDROXYPROGESTERONE: 17-OH-Progesterone, LC/MS/MS: 50 ng/dL

## 2014-02-25 LAB — ANDROSTENEDIONE: Androstenedione: 93 ng/dL

## 2014-02-26 ENCOUNTER — Telehealth: Payer: Self-pay | Admitting: Internal Medicine

## 2014-02-26 NOTE — Telephone Encounter (Signed)
Since last seen pt had MRI that afternoon pt has brain cyst and brain atrophy. On Tuesday AM she had a sleep deprived EEG and it showed she has epilepsy. These tests were done at [facility]

## 2014-02-27 ENCOUNTER — Encounter: Payer: Self-pay | Admitting: *Deleted

## 2014-02-27 NOTE — Telephone Encounter (Signed)
I reviewed the chart and I saw the atrophy but not the cyst... Sorry to hear about the epilepsy! I am still waiting for the labs that we drew at last visit. Will let her know as soon as they are back.

## 2014-02-27 NOTE — Telephone Encounter (Signed)
Please read note below

## 2014-03-02 LAB — ESTRADIOL, FREE
Estradiol, Free: 0.29 pg/mL
Estradiol: 16 pg/mL

## 2014-03-09 ENCOUNTER — Encounter: Payer: Self-pay | Admitting: Internal Medicine

## 2014-03-24 ENCOUNTER — Ambulatory Visit (INDEPENDENT_AMBULATORY_CARE_PROVIDER_SITE_OTHER): Payer: 59 | Admitting: Family Medicine

## 2014-03-24 ENCOUNTER — Encounter: Payer: Self-pay | Admitting: Family Medicine

## 2014-03-24 VITALS — BP 100/80 | HR 82 | Temp 98.2°F | Ht 64.0 in | Wt 180.5 lb

## 2014-03-24 DIAGNOSIS — J3089 Other allergic rhinitis: Secondary | ICD-10-CM

## 2014-03-24 DIAGNOSIS — H6990 Unspecified Eustachian tube disorder, unspecified ear: Secondary | ICD-10-CM

## 2014-03-24 DIAGNOSIS — H6993 Unspecified Eustachian tube disorder, bilateral: Secondary | ICD-10-CM

## 2014-03-24 IMAGING — CR DG CHEST 1V PORT
1 series · 1 of 1 positions shown · non-contrast
Comparison: 04/04/2008 and 08/30/2004

CLINICAL DATA: Chest pain and cough.

PORTABLE CHEST - 1 VIEW

[AP]
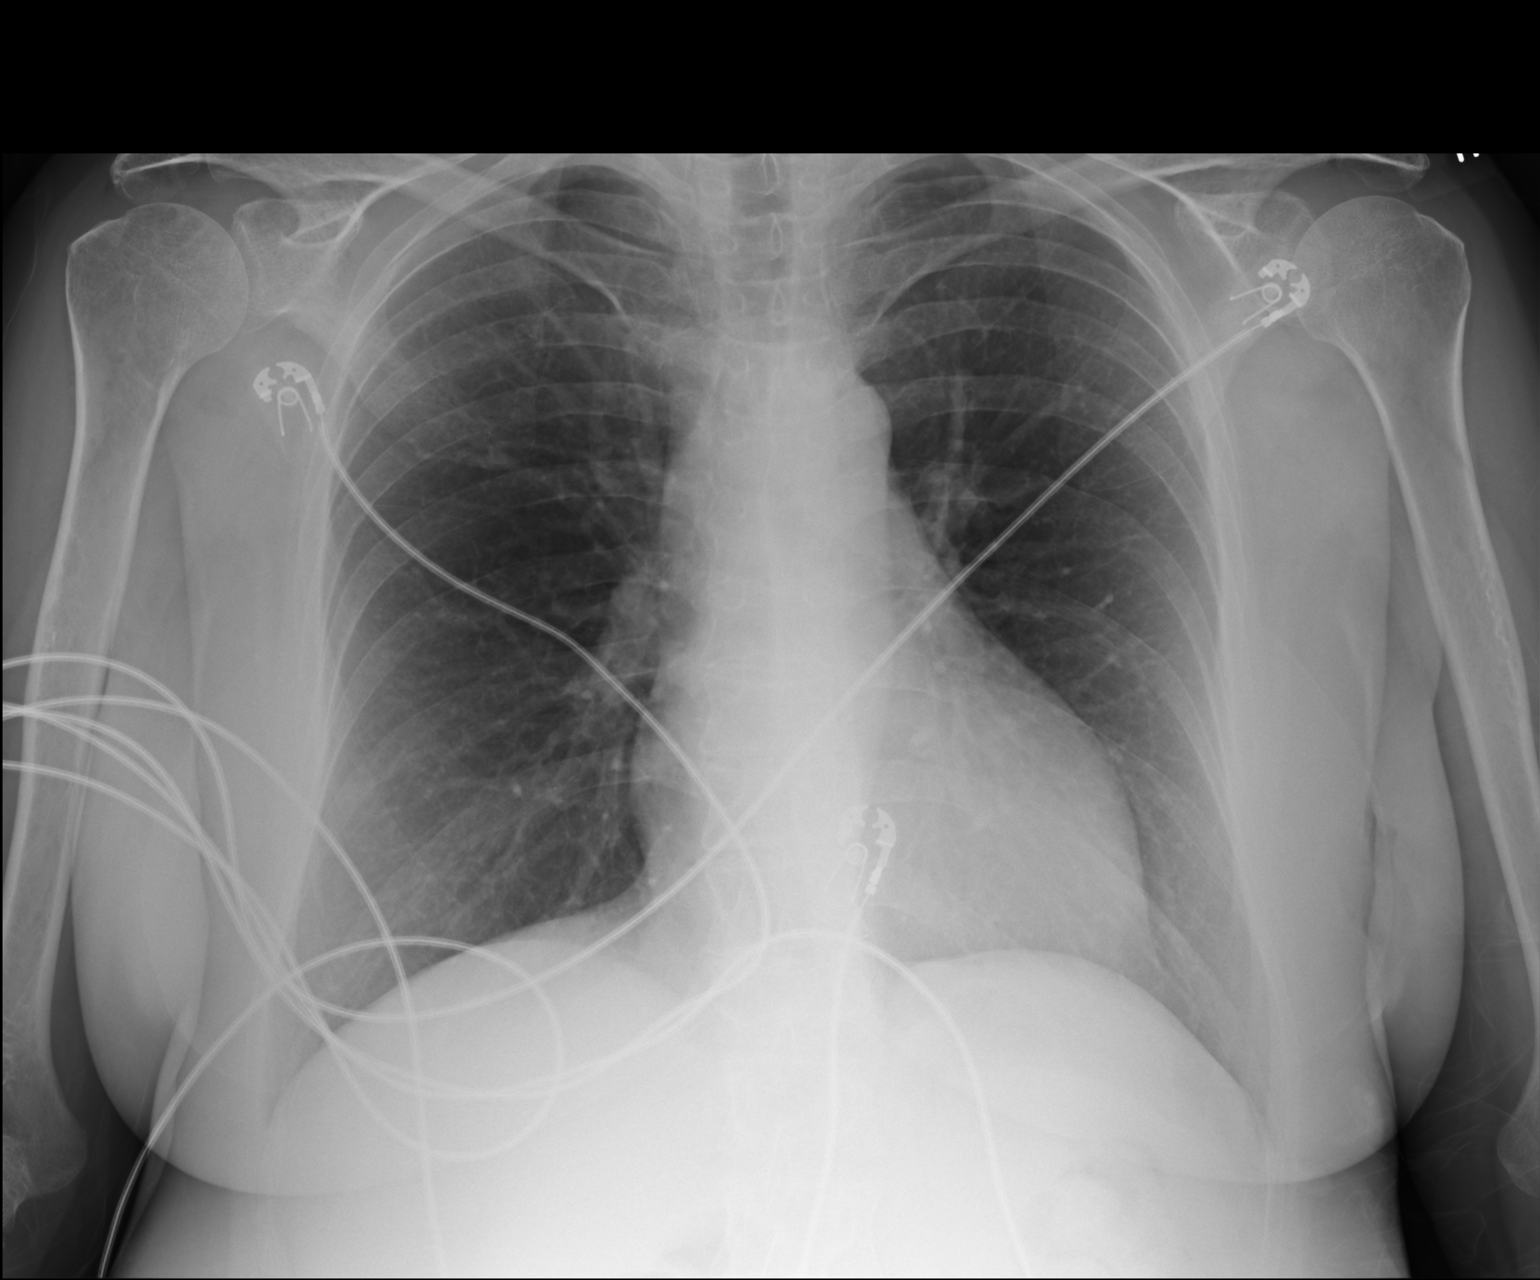

[1 of 1 positions shown; findings below may reference images not displayed]

FINDINGS: The cardiomediastinal silhouette is unremarkable.
The lungs are clear.
There is no evidence of focal airspace disease, pulmonary edema,
suspicious pulmonary nodule/mass, pleural effusion, or
pneumothorax.
No acute bony abnormalities are identified.
IMPRESSION: No evidence of active cardiopulmonary disease.

## 2014-03-24 MED ORDER — FLUTICASONE PROPIONATE 50 MCG/ACT NA SUSP: 2.0000 | Freq: Every day | NASAL | Status: DC

## 2014-03-24 NOTE — Patient Instructions (Addendum)
-  afrin daily for 3 days  -start flonase for 1 month  -follow up with Dr. Shawna Orleans in 1 month
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
## 2014-03-24 NOTE — Progress Notes (Signed)
Pre visit review using our clinic review tool, if applicable. No additional management support is needed unless otherwise documented below in the visit note. 

## 2014-04-08 ENCOUNTER — Encounter: Payer: Self-pay | Admitting: Internal Medicine

## 2014-04-09 ENCOUNTER — Encounter: Payer: Self-pay | Admitting: Internal Medicine

## 2014-04-09 ENCOUNTER — Ambulatory Visit (INDEPENDENT_AMBULATORY_CARE_PROVIDER_SITE_OTHER): Payer: 59 | Admitting: Internal Medicine

## 2014-04-09 VITALS — BP 116/72 | HR 82 | Temp 98.1°F | Resp 12 | Wt 179.0 lb

## 2014-04-09 DIAGNOSIS — E349 Endocrine disorder, unspecified: Secondary | ICD-10-CM

## 2014-04-09 DIAGNOSIS — E042 Nontoxic multinodular goiter: Secondary | ICD-10-CM | POA: Insufficient documentation

## 2014-04-09 DIAGNOSIS — R7989 Other specified abnormal findings of blood chemistry: Secondary | ICD-10-CM

## 2014-04-09 NOTE — Patient Instructions (Signed)
Please return in a year to investigate your thyroid.  We may need to get another ultrasound then or in 2 years from the previous. Please check with your PCP Re: the fluid in the L ear.
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
## 2014-04-10 ENCOUNTER — Ambulatory Visit: Payer: 59 | Admitting: Internal Medicine

## 2014-04-13 ENCOUNTER — Encounter: Payer: Self-pay | Admitting: Internal Medicine

## 2014-04-13 ENCOUNTER — Ambulatory Visit (INDEPENDENT_AMBULATORY_CARE_PROVIDER_SITE_OTHER): Payer: 59 | Admitting: Internal Medicine

## 2014-04-13 VITALS — BP 110/80 | HR 72 | Temp 97.8°F | Ht 64.0 in | Wt 177.0 lb

## 2014-04-13 DIAGNOSIS — H6983 Other specified disorders of Eustachian tube, bilateral: Secondary | ICD-10-CM

## 2014-04-13 DIAGNOSIS — H6980 Other specified disorders of Eustachian tube, unspecified ear: Secondary | ICD-10-CM | POA: Insufficient documentation

## 2014-04-13 DIAGNOSIS — G2581 Restless legs syndrome: Secondary | ICD-10-CM

## 2014-04-13 DIAGNOSIS — M810 Age-related osteoporosis without current pathological fracture: Secondary | ICD-10-CM
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
## 2014-04-13 NOTE — Progress Notes (Signed)
Pre visit review using our clinic review tool, if applicable. No additional management support is needed unless otherwise documented below in the visit note. 

## 2014-04-13 NOTE — Assessment & Plan Note (Signed)
Continue to use intranasal saline, Flonase and Zyrtec. If persistent or worsening symptoms she will followup with her ENT.

## 2014-04-13 NOTE — Assessment & Plan Note (Signed)
Patient advised to followup with her neurologist regarding restless leg syndrome. Neurologist may want to consider transitioning her to gabapentin Versus using Requip due to her epilepsy issues.

## 2014-04-13 NOTE — Assessment & Plan Note (Signed)
Continue Prolia injection q 6 months.

## 2014-04-13 NOTE — Patient Instructions (Signed)
Please follow up with your neurologist re: medication for restless leg syndrome Continue to use intranasal flonase, zyrtec and saline Our office will contact you when Prolia becomes available

## 2014-05-28 ENCOUNTER — Other Ambulatory Visit: Payer: Self-pay | Admitting: Internal Medicine

## 2014-06-25 ENCOUNTER — Other Ambulatory Visit: Payer: Self-pay | Admitting: Internal Medicine

## 2014-07-17 ENCOUNTER — Encounter: Payer: Self-pay | Admitting: Internal Medicine

## 2014-07-17 ENCOUNTER — Ambulatory Visit (INDEPENDENT_AMBULATORY_CARE_PROVIDER_SITE_OTHER): Payer: 59 | Admitting: Internal Medicine

## 2014-07-17 ENCOUNTER — Ambulatory Visit (INDEPENDENT_AMBULATORY_CARE_PROVIDER_SITE_OTHER): Payer: 59 | Admitting: *Deleted

## 2014-07-17 VITALS — BP 110/70 | HR 68 | Temp 98.1°F | Ht 64.0 in | Wt 173.0 lb

## 2014-07-17 DIAGNOSIS — F5104 Psychophysiologic insomnia: Secondary | ICD-10-CM

## 2014-07-17 DIAGNOSIS — Z23 Encounter for immunization: Secondary | ICD-10-CM

## 2014-07-17 DIAGNOSIS — M810 Age-related osteoporosis without current pathological fracture: Secondary | ICD-10-CM

## 2014-07-17 DIAGNOSIS — G43009 Migraine without aura, not intractable, without status migrainosus: Secondary | ICD-10-CM

## 2014-07-17 DIAGNOSIS — G4700 Insomnia, unspecified: Secondary | ICD-10-CM

## 2014-07-17 MED ORDER — CLONAZEPAM 1 MG PO TABS: 1.0000 mg | ORAL_TABLET | Freq: Every evening | ORAL | Status: DC | PRN

## 2014-07-17 MED ORDER — DENOSUMAB 60 MG/ML ~~LOC~~ SOLN
60.0000 mg | Freq: Once | SUBCUTANEOUS | Status: AC
  Administered 2014-07-17: 60 mg via SUBCUTANEOUS

## 2014-07-17 NOTE — Progress Notes (Signed)
Pre visit review using our clinic review tool, if applicable. No additional management support is needed unless otherwise documented below in the visit note. 

## 2014-07-17 NOTE — Assessment & Plan Note (Signed)
Improved.  Patient use "migraines TENs unit".

## 2014-07-17 NOTE — Assessment & Plan Note (Signed)
Discontinue Ambien.  Switch to clonazepam.  Taper trazodone dose.
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
## 2014-09-22 ENCOUNTER — Telehealth: Payer: Self-pay | Admitting: Internal Medicine

## 2014-09-22 MED ORDER — CLONAZEPAM 1 MG PO TABS: 1.0000 mg | ORAL_TABLET | Freq: Every evening | ORAL | Status: DC | PRN

## 2014-09-22 NOTE — Telephone Encounter (Signed)
[facility] for #90 refill only.  Same sig

## 2014-09-22 NOTE — Telephone Encounter (Signed)
rx was called into [facility], pt requested a 90 day supply.  Dr Shawna Orleans have given her #30 with 3 refills so I changed the qty to #90 with no refills.  FYI sent to Dr Shawna Orleans

## 2014-09-22 NOTE — Telephone Encounter (Signed)
I received a PA request from Kristopher Oppenheim for clonazepam 1 mg.  I spoke to patient's plan and was advised the medication needs to go through a [facility] outpatient pharmacy. Fax was sent back to [facility] with that information.

## 2015-01-26 ENCOUNTER — Telehealth: Payer: Self-pay | Admitting: *Deleted

## 2015-01-26 NOTE — Telephone Encounter (Signed)
Refill clonazepam 1 mg 1 tablet qhs prn for anxiety

## 2015-01-27 ENCOUNTER — Other Ambulatory Visit: Payer: Self-pay | Admitting: *Deleted

## 2015-01-27 MED ORDER — CLONAZEPAM 1 MG PO TABS: 1.0000 mg | ORAL_TABLET | Freq: Every evening | ORAL | Status: DC | PRN

## 2015-01-27 NOTE — Telephone Encounter (Signed)
Ok to RF x 2

## 2015-01-28 ENCOUNTER — Other Ambulatory Visit: Payer: Self-pay | Admitting: *Deleted

## 2015-01-28 MED ORDER — CLONAZEPAM 1 MG PO TABS: 1.0000 mg | ORAL_TABLET | Freq: Every evening | ORAL | Status: DC | PRN

## 2015-01-28 NOTE — Telephone Encounter (Signed)
rx called in to [facility]

## 2015-02-26 ENCOUNTER — Telehealth: Payer: Self-pay | Admitting: Internal Medicine

## 2015-02-26 NOTE — Telephone Encounter (Signed)
Patient would like to schedule her Prolia injection and her rubella titer was 25.  Is it okay to schedule the Prolia injection?  She will bring the results from her immunizations to her next appointment for review.

## 2015-03-01 ENCOUNTER — Ambulatory Visit (INDEPENDENT_AMBULATORY_CARE_PROVIDER_SITE_OTHER): Payer: 59 | Admitting: *Deleted

## 2015-03-01 ENCOUNTER — Other Ambulatory Visit: Payer: Self-pay | Admitting: Internal Medicine

## 2015-03-01 DIAGNOSIS — Z23 Encounter for immunization: Secondary | ICD-10-CM | POA: Diagnosis not present

## 2015-03-01 DIAGNOSIS — M810 Age-related osteoporosis without current pathological fracture: Secondary | ICD-10-CM | POA: Diagnosis not present

## 2015-03-01 MED ORDER — DENOSUMAB 60 MG/ML ~~LOC~~ SOLN
60.0000 mg | Freq: Once | SUBCUTANEOUS | Status: AC
  Administered 2015-03-01: 60 mg via SUBCUTANEOUS

## 2015-03-01 NOTE — Telephone Encounter (Signed)
Patient is scheduled and she will bring in the immunization results.

## 2015-03-01 NOTE — Telephone Encounter (Signed)
Yes please schedule prolia injection

## 2015-04-13 ENCOUNTER — Ambulatory Visit (INDEPENDENT_AMBULATORY_CARE_PROVIDER_SITE_OTHER): Payer: 59 | Admitting: Internal Medicine

## 2015-04-13 VITALS — BP 118/72 | HR 65 | Temp 98.1°F | Resp 12 | Wt 191.6 lb

## 2015-04-13 DIAGNOSIS — E042 Nontoxic multinodular goiter: Secondary | ICD-10-CM

## 2015-04-13 DIAGNOSIS — E041 Nontoxic single thyroid nodule: Secondary | ICD-10-CM | POA: Diagnosis not present

## 2015-04-13 LAB — TSH: TSH: 1.35 u[IU]/mL (ref 0.35–4.50)
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
## 2015-04-13 NOTE — Patient Instructions (Signed)
Please stop at the lab.  Please let me know if you develop: - Problems swallowing - Choking - Shortness of breath with lying down - Feeling lumps in your neck   Please return in 1 year.

## 2015-04-16 ENCOUNTER — Encounter: Payer: Self-pay | Admitting: Internal Medicine

## 2015-05-28 ENCOUNTER — Telehealth: Payer: Self-pay | Admitting: Internal Medicine

## 2015-05-28 DIAGNOSIS — M810 Age-related osteoporosis without current pathological fracture: Secondary | ICD-10-CM

## 2015-05-28 NOTE — Telephone Encounter (Signed)
03/01/15 was the last prolia shot. Left message on machine for patient to return our call.

## 2015-05-28 NOTE — Telephone Encounter (Signed)
Pt call to ask when she has had a prolia shot. She is having some dental work done and needs this information

## 2015-05-31 ENCOUNTER — Other Ambulatory Visit: Payer: Self-pay

## 2015-05-31 DIAGNOSIS — Z1231 Encounter for screening mammogram for malignant neoplasm of breast: Secondary | ICD-10-CM

## 2015-05-31 DIAGNOSIS — Z9882 Breast implant status: Secondary | ICD-10-CM

## 2015-05-31 NOTE — Telephone Encounter (Signed)
Pt returning call regarding date of last prolia injection.  I notified her of last injection on 03/01/15.  Pt also requesting order for bone density.

## 2015-06-01 NOTE — Telephone Encounter (Signed)
[place] for order for bone density

## 2015-06-02 NOTE — Telephone Encounter (Signed)
Lm on vm to cb and schedule bone density

## 2015-06-02 NOTE — Telephone Encounter (Signed)
Please call and schedule Kathryn [name] for a bone density.  Order has been placed.

## 2015-06-03 NOTE — Telephone Encounter (Signed)
Pt has been scheduled.  °

## 2015-06-04 ENCOUNTER — Ambulatory Visit (INDEPENDENT_AMBULATORY_CARE_PROVIDER_SITE_OTHER)
Admission: RE | Admit: 2015-06-04 | Discharge: 2015-06-04 | Disposition: A | Discharge status: Home / Self Care | Payer: 59 | Source: Ambulatory Visit | Attending: Internal Medicine | Admitting: Internal Medicine

## 2015-06-04 DIAGNOSIS — M810 Age-related osteoporosis without current pathological fracture: Secondary | ICD-10-CM

## 2015-06-08 ENCOUNTER — Ambulatory Visit
Admission: RE | Admit: 2015-06-08 | Discharge: 2015-06-08 | Disposition: A | Discharge status: Home / Self Care | Payer: 59 | Source: Ambulatory Visit

## 2015-06-08 DIAGNOSIS — Z1231 Encounter for screening mammogram for malignant neoplasm of breast: Secondary | ICD-10-CM

## 2015-06-08 DIAGNOSIS — Z9882 Breast implant status: Secondary | ICD-10-CM

## 2015-06-14 ENCOUNTER — Other Ambulatory Visit: Payer: Self-pay | Admitting: Internal Medicine

## 2015-06-14 DIAGNOSIS — N6310 Unspecified lump in the right breast, unspecified quadrant: Secondary | ICD-10-CM

## 2015-06-15 ENCOUNTER — Other Ambulatory Visit: Payer: Self-pay | Admitting: Internal Medicine

## 2015-06-17 DIAGNOSIS — G8929 Other chronic pain: Secondary | ICD-10-CM | POA: Insufficient documentation

## 2015-06-17 DIAGNOSIS — M545 Low back pain, unspecified: Secondary | ICD-10-CM | POA: Insufficient documentation

## 2015-06-17 DIAGNOSIS — F411 Generalized anxiety disorder: Secondary | ICD-10-CM | POA: Insufficient documentation

## 2015-06-18 ENCOUNTER — Other Ambulatory Visit: Payer: 59

## 2016-04-12 ENCOUNTER — Ambulatory Visit: Payer: 59 | Admitting: Internal Medicine

## 2016-06-27 DIAGNOSIS — Z62810 Personal history of physical and sexual abuse in childhood: Secondary | ICD-10-CM | POA: Insufficient documentation

## 2016-06-27 DIAGNOSIS — K9041 Non-celiac gluten sensitivity: Secondary | ICD-10-CM | POA: Insufficient documentation

## 2016-06-27 DIAGNOSIS — F4310 Post-traumatic stress disorder, unspecified: Secondary | ICD-10-CM | POA: Insufficient documentation

## 2016-10-23 ENCOUNTER — Encounter (HOSPITAL_COMMUNITY): Payer: Self-pay | Admitting: Emergency Medicine

## 2016-10-23 ENCOUNTER — Ambulatory Visit (HOSPITAL_COMMUNITY)
Admission: EM | Admit: 2016-10-23 | Discharge: 2016-10-23 | Disposition: A | Discharge status: Home / Self Care | Payer: 59 | Attending: Internal Medicine | Admitting: Internal Medicine

## 2016-10-23 DIAGNOSIS — J45909 Unspecified asthma, uncomplicated: Secondary | ICD-10-CM | POA: Insufficient documentation

## 2016-10-23 DIAGNOSIS — F329 Major depressive disorder, single episode, unspecified: Secondary | ICD-10-CM | POA: Insufficient documentation

## 2016-10-23 DIAGNOSIS — Z8542 Personal history of malignant neoplasm of other parts of uterus: Secondary | ICD-10-CM | POA: Diagnosis not present

## 2016-10-23 DIAGNOSIS — F900 Attention-deficit hyperactivity disorder, predominantly inattentive type: Secondary | ICD-10-CM | POA: Diagnosis not present

## 2016-10-23 DIAGNOSIS — E280 Estrogen excess: Secondary | ICD-10-CM | POA: Insufficient documentation

## 2016-10-23 DIAGNOSIS — G43909 Migraine, unspecified, not intractable, without status migrainosus: Secondary | ICD-10-CM | POA: Insufficient documentation

## 2016-10-23 DIAGNOSIS — G4733 Obstructive sleep apnea (adult) (pediatric): Secondary | ICD-10-CM | POA: Insufficient documentation

## 2016-10-23 DIAGNOSIS — M8580 Other specified disorders of bone density and structure, unspecified site: Secondary | ICD-10-CM | POA: Diagnosis not present

## 2016-10-23 DIAGNOSIS — K649 Unspecified hemorrhoids: Secondary | ICD-10-CM

## 2016-10-23 DIAGNOSIS — E042 Nontoxic multinodular goiter: Secondary | ICD-10-CM | POA: Insufficient documentation

## 2016-10-23 DIAGNOSIS — R55 Syncope and collapse: Secondary | ICD-10-CM | POA: Insufficient documentation

## 2016-10-23 DIAGNOSIS — I959 Hypotension, unspecified: Secondary | ICD-10-CM | POA: Insufficient documentation

## 2016-10-23 DIAGNOSIS — K625 Hemorrhage of anus and rectum: Secondary | ICD-10-CM | POA: Insufficient documentation

## 2016-10-23 DIAGNOSIS — Z8249 Family history of ischemic heart disease and other diseases of the circulatory system: Secondary | ICD-10-CM | POA: Diagnosis not present

## 2016-10-23 DIAGNOSIS — K219 Gastro-esophageal reflux disease without esophagitis: Secondary | ICD-10-CM | POA: Diagnosis not present

## 2016-10-23 DIAGNOSIS — F332 Major depressive disorder, recurrent severe without psychotic features: Secondary | ICD-10-CM | POA: Diagnosis not present

## 2016-10-23 DIAGNOSIS — Z7722 Contact with and (suspected) exposure to environmental tobacco smoke (acute) (chronic): Secondary | ICD-10-CM | POA: Insufficient documentation

## 2016-10-23 DIAGNOSIS — F4001 Agoraphobia with panic disorder: Secondary | ICD-10-CM | POA: Diagnosis not present

## 2016-10-23 DIAGNOSIS — F419 Anxiety disorder, unspecified: Secondary | ICD-10-CM | POA: Diagnosis not present

## 2016-10-23 LAB — CBC
HCT: 34.2 % — ABNORMAL LOW (ref 36.0–46.0)
Hemoglobin: 11.4 g/dL — ABNORMAL LOW (ref 12.0–15.0)
MCH: 31.2 pg (ref 26.0–34.0)
MCHC: 33.3 g/dL (ref 30.0–36.0)
MCV: 93.7 fL (ref 78.0–100.0)
Platelets: 186 10*3/uL (ref 150–400)
RBC: 3.65 MIL/uL — ABNORMAL LOW (ref 3.87–5.11)
RDW: 13.2 % (ref 11.5–15.5)
WBC: 5.7 10*3/uL (ref 4.0–10.5)

## 2016-10-23 MED ORDER — LIDOCAINE (ANORECTAL) 5 % EX CREA: 1.0000 | TOPICAL_CREAM | Freq: Four times a day (QID) | CUTANEOUS | 0 refills | Status: AC | PRN

## 2016-10-23 MED ORDER — DOCUSATE SODIUM 100 MG PO CAPS: 100.0000 mg | ORAL_CAPSULE | Freq: Two times a day (BID) | ORAL | 0 refills | Status: DC

## 2016-10-23 MED FILL — LIDOCAINE ANORECTAL 5% CRM: 5 | 10 days supply | Qty: 30 | Fill #0

## 2016-10-23 MED FILL — SERTRALINE HCL 100 MG TAB: 100 | 30 days supply | Qty: 45 | Fill #0
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
## 2016-10-23 NOTE — Discharge Instructions (Addendum)
Bleeding today seems likely to be from the hemorrhoid seen on exam.  Blood count was near normal, hemoglobin of 11.4.  Orthostatic vitals suggested that you have not had a severe drop in blood volume.  Prescriptions for stool softener and numbing cream for hemorrhoid were sent to the pharmacy.  Followup with Dr Paulita Fujita.

## 2016-10-23 NOTE — ED Triage Notes (Signed)
The patient presented to the [facility] with a complaint of rectal bleeding that started today. The patient reported a possible hemorrhoid x 1 week but it started bleeding today.

## 2016-11-24 ENCOUNTER — Encounter: Payer: Self-pay | Admitting: *Deleted

## 2016-11-24 ENCOUNTER — Ambulatory Visit: Payer: 59 | Admitting: Family Medicine

## 2016-11-24 ENCOUNTER — Telehealth: Payer: Self-pay | Admitting: *Deleted

## 2016-11-24 NOTE — Telephone Encounter (Signed)
PreVisit Call Completed. Pt states she needs refills for Albuterol Inhaler and Keppra. States that she goes to Dr Erling Cruz with [facility] for prescriptions for Imitrex injector pens (for migraines) but she would prefer to get all of her prescriptions from the same provider. Pt also has taken Prulia in the past but has been off of it for a few months due to implants. Pt also likes to receive 3 month prescriptions if possible.  No acute complaints.

## 2016-11-27 ENCOUNTER — Encounter: Payer: Self-pay | Admitting: Family Medicine

## 2016-11-27 ENCOUNTER — Ambulatory Visit (INDEPENDENT_AMBULATORY_CARE_PROVIDER_SITE_OTHER): Payer: 59 | Admitting: Family Medicine

## 2016-11-27 VITALS — BP 100/60 | HR 86 | Temp 98.1°F | Ht 62.6 in | Wt 151.4 lb

## 2016-11-27 DIAGNOSIS — R9401 Abnormal electroencephalogram [EEG]: Secondary | ICD-10-CM

## 2016-11-27 DIAGNOSIS — E042 Nontoxic multinodular goiter: Secondary | ICD-10-CM | POA: Diagnosis not present

## 2016-11-27 DIAGNOSIS — J4520 Mild intermittent asthma, uncomplicated: Secondary | ICD-10-CM | POA: Diagnosis not present

## 2016-11-27 DIAGNOSIS — F329 Major depressive disorder, single episode, unspecified: Secondary | ICD-10-CM

## 2016-11-27 DIAGNOSIS — G43009 Migraine without aura, not intractable, without status migrainosus: Secondary | ICD-10-CM | POA: Diagnosis not present

## 2016-11-27 DIAGNOSIS — K649 Unspecified hemorrhoids: Secondary | ICD-10-CM | POA: Diagnosis not present

## 2016-11-27 DIAGNOSIS — D649 Anemia, unspecified: Secondary | ICD-10-CM | POA: Diagnosis not present

## 2016-11-27 DIAGNOSIS — F5104 Psychophysiologic insomnia: Secondary | ICD-10-CM | POA: Diagnosis not present

## 2016-11-27 DIAGNOSIS — Z6827 Body mass index (BMI) 27.0-27.9, adult: Secondary | ICD-10-CM | POA: Diagnosis not present

## 2016-11-27 DIAGNOSIS — Z13220 Encounter for screening for lipoid disorders: Secondary | ICD-10-CM | POA: Diagnosis not present

## 2016-11-27 DIAGNOSIS — F32A Depression, unspecified: Secondary | ICD-10-CM

## 2016-11-27 MED ORDER — SUMATRIPTAN SUCCINATE 6 MG/0.5ML ~~LOC~~ SOLN: 6.0000 mg | SUBCUTANEOUS | 0 refills | Status: DC | PRN

## 2016-11-27 MED ORDER — LEVETIRACETAM 500 MG PO TABS: 1500.0000 mg | ORAL_TABLET | Freq: Two times a day (BID) | ORAL | 0 refills | Status: DC

## 2016-11-27 MED ORDER — ALBUTEROL SULFATE HFA 108 (90 BASE) MCG/ACT IN AERS: 2.0000 | INHALATION_SPRAY | RESPIRATORY_TRACT | 1 refills | Status: DC | PRN

## 2016-11-27 MED FILL — VENTOLIN HFA 90 MCG INHALER: 108 (90 BAS | 16 days supply | Qty: 18 | Fill #0

## 2016-11-27 MED FILL — ROWEEPRA 500 MG TAB: 500 | 90 days supply | Qty: 540 | Fill #0

## 2016-11-27 MED FILL — SUMATRIPTAN 6 MG/0.5 ML VIA: 6 | 70 days supply | Qty: 5 | Fill #0

## 2016-11-27 NOTE — Progress Notes (Signed)
Pre visit review using our clinic review tool, if applicable. No additional management support is needed unless otherwise documented below in the visit note. 
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
## 2016-11-27 NOTE — Patient Instructions (Signed)
Please schedule a lab only visit, when you come bring your titer reports and we'll put them in your record and develop a plan for your series.  I will send your lab results via [place].

## 2016-11-28 ENCOUNTER — Other Ambulatory Visit (INDEPENDENT_AMBULATORY_CARE_PROVIDER_SITE_OTHER): Payer: 59

## 2016-11-28 DIAGNOSIS — F32A Depression, unspecified: Secondary | ICD-10-CM

## 2016-11-28 DIAGNOSIS — Z13220 Encounter for screening for lipoid disorders: Secondary | ICD-10-CM | POA: Diagnosis not present

## 2016-11-28 DIAGNOSIS — D649 Anemia, unspecified: Secondary | ICD-10-CM | POA: Diagnosis not present

## 2016-11-28 DIAGNOSIS — K649 Unspecified hemorrhoids: Secondary | ICD-10-CM | POA: Diagnosis not present

## 2016-11-28 DIAGNOSIS — F329 Major depressive disorder, single episode, unspecified: Secondary | ICD-10-CM

## 2016-11-28 DIAGNOSIS — Z6827 Body mass index (BMI) 27.0-27.9, adult: Secondary | ICD-10-CM

## 2016-11-28 DIAGNOSIS — E042 Nontoxic multinodular goiter: Secondary | ICD-10-CM | POA: Diagnosis not present

## 2016-11-28 LAB — CBC WITH DIFFERENTIAL/PLATELET
Basophils Absolute: 0 10*3/uL (ref 0.0–0.1)
Basophils Relative: 0.9 % (ref 0.0–3.0)
Eosinophils Absolute: 0.2 10*3/uL (ref 0.0–0.7)
Eosinophils Relative: 4.2 % (ref 0.0–5.0)
HCT: 39 % (ref 36.0–46.0)
Hemoglobin: 12.8 g/dL (ref 12.0–15.0)
Lymphocytes Relative: 37.8 % (ref 12.0–46.0)
Lymphs Abs: 1.7 10*3/uL (ref 0.7–4.0)
MCHC: 32.8 g/dL (ref 30.0–36.0)
MCV: 95.8 fl (ref 78.0–100.0)
Monocytes Absolute: 0.4 10*3/uL (ref 0.1–1.0)
Monocytes Relative: 9.1 % (ref 3.0–12.0)
Neutro Abs: 2.2 10*3/uL (ref 1.4–7.7)
Neutrophils Relative %: 48 % (ref 43.0–77.0)
Platelets: 231 10*3/uL (ref 150.0–400.0)
RBC: 4.08 Mil/uL (ref 3.87–5.11)
RDW: 13.6 % (ref 11.5–15.5)
WBC: 4.5 10*3/uL (ref 4.0–10.5)

## 2016-11-28 LAB — COMPREHENSIVE METABOLIC PANEL
ALT: 13 U/L (ref 0–35)
AST: 15 U/L (ref 0–37)
Albumin: 4.3 g/dL (ref 3.5–5.2)
Alkaline Phosphatase: 54 U/L (ref 39–117)
BUN: 19 mg/dL (ref 6–23)
CO2: 29 mEq/L (ref 19–32)
Calcium: 9.6 mg/dL (ref 8.4–10.5)
Chloride: 103 mEq/L (ref 96–112)
Creatinine, Ser: 0.76 mg/dL (ref 0.40–1.20)
GFR: 84.17 mL/min (ref >60.00)
Glucose, Bld: 92 mg/dL (ref 70–99)
Potassium: 4.1 mEq/L (ref 3.5–5.1)
Sodium: 139 mEq/L (ref 135–145)
Total Bilirubin: 0.4 mg/dL (ref 0.2–1.2)
Total Protein: 7.4 g/dL (ref 6.0–8.3)

## 2016-11-28 LAB — HEMOGLOBIN A1C: Hgb A1c MFr Bld: 5.4 % (ref 4.6–6.5)

## 2016-11-28 LAB — LIPID PANEL
Cholesterol: 241 mg/dL — ABNORMAL HIGH (ref 0–200)
HDL: 56.8 mg/dL (ref >39.00)
LDL Cholesterol: 169 mg/dL — ABNORMAL HIGH (ref 0–99)
NonHDL: 184.14
Total CHOL/HDL Ratio: 4
Triglycerides: 77 mg/dL (ref 0.0–149.0)
VLDL: 15.4 mg/dL (ref 0.0–40.0)

## 2016-11-28 LAB — TSH: TSH: 1.61 u[IU]/mL (ref 0.35–4.50)

## 2016-11-28 LAB — VITAMIN D 25 HYDROXY (VIT D DEFICIENCY, FRACTURES): VITD: 28.44 ng/mL — ABNORMAL LOW (ref 30.00–100.00)

## 2016-12-04 MED FILL — clonazePAM 1 MG TABS: 1 | 90 days supply | Qty: 180 | Fill #0

## 2016-12-05 ENCOUNTER — Encounter: Payer: Self-pay | Admitting: Physician Assistant

## 2016-12-05 ENCOUNTER — Telehealth: Payer: Self-pay | Admitting: Family Medicine

## 2016-12-05 DIAGNOSIS — F4001 Agoraphobia with panic disorder: Secondary | ICD-10-CM | POA: Diagnosis not present

## 2016-12-05 DIAGNOSIS — F900 Attention-deficit hyperactivity disorder, predominantly inattentive type: Secondary | ICD-10-CM | POA: Diagnosis not present

## 2016-12-05 DIAGNOSIS — F331 Major depressive disorder, recurrent, moderate: Secondary | ICD-10-CM | POA: Diagnosis not present

## 2016-12-05 MED FILL — ARMODAFINIL 150 MG TABLET: 150 | 90 days supply | Qty: 90 | Fill #0

## 2016-12-05 MED FILL — SERTRALINE HCL 100 MG TAB: 100 | 90 days supply | Qty: 135 | Fill #0

## 2016-12-05 MED FILL — ALPRAZolam 0.5 MG TABS: 0.5 | 90 days supply | Qty: 180 | Fill #0

## 2016-12-05 NOTE — Telephone Encounter (Signed)
We have Toradol injections but she will need to be seen and evaluated to have one. Can you see if Sam or Dr. Morene Rankins are able to see her for an acute visit today?

## 2016-12-05 NOTE — Telephone Encounter (Signed)
Okay for pt to come in for injection or does patient need to be seen?

## 2016-12-05 NOTE — Telephone Encounter (Signed)
Patient called asking to come in for Toradaol Injection, stated Kathryn [name] said she could come in and get one. Advised you would either touch base with me or her first, and schedule from there. She wanted to come in today.

## 2016-12-05 NOTE — Telephone Encounter (Signed)
Called left voicemail for pt to return call to office.

## 2016-12-12 MED FILL — OXTELLAR XR 150 MG TABLET: 150 | 30 days supply | Qty: 30 | Fill #0

## 2016-12-12 NOTE — Telephone Encounter (Signed)
Called to check on patient. Patient states that now is not a good time to talk she is dealing with a family matter. Pt will contact the office as needed. Nothing further needed at this time.

## 2016-12-13 ENCOUNTER — Ambulatory Visit: Payer: 59 | Admitting: Physician Assistant

## 2016-12-15 ENCOUNTER — Telehealth: Payer: Self-pay | Admitting: Family Medicine

## 2016-12-15 NOTE — Telephone Encounter (Signed)
Patient called and expressed that she was "very upset and needed an increase in her Xanax rx in order for her to function." Patient stated that "she feels that she is getting to an off the rocker point in her emotions." Patient stated that she was prescribed the Xanax by Noemi Chapel at [facility] [place]. Patient states that she has tried multiple times to speak to her therapist about the increase in Xanax however, has not been given a call back in days.    I advised patient that I would ask the two providers here at [facility] today Dr. Briscoe Deutscher and Inda Coke, PA for guidance on how we can proceed with getting her what she needs. I expressed to patient that I would call her back with an update today.   I spoke with Inda Coke, PA and the CMA [facility] for Dr. Briscoe Deutscher due to Dr. Juleen China being with a patient and both stated that they were not comfortable prescribing medication to patient due to the patient's therapist being the overseer of the Xanax. Due to patient requesting an increase to Xanax both providers would not be able to change the dosage. I was advised from [place], [place] and Inda Coke, [place] that I should call [facility] and ask to speak to a manager or Noemi Chapel herself about the matter.   I called [facility] and requested to speak to the therapist or a manager and [place] forwarded me to the pharmacy coordinator for [facility] where I left a detailed voicemail and requested a call back today to guide patient.

## 2016-12-15 NOTE — Telephone Encounter (Signed)
Patient requests a call from PCP Naomie Dean personally (patient does not want to speak to a CMA about this matter, per patient) to discuss referrals for psychiatric/ counseling services due to being displeased with her current center, [facility]. Please call patient to discuss at your earliest convenience at [phone].  Please see previous notes for a summary leading up to this one.

## 2016-12-15 NOTE — Telephone Encounter (Signed)
I called patient to give an update on speaking to the providers here at [facility]. I advised patient that both providers were uncomfortable with taking on the medication due to Noemi Chapel prescribing the medication at [place]. I advised patient that I called [facility] personally and left a detailed message on the pharmacy coordinator voicemail for [facility]attie Haw to give the office a call to discuss the matter.   Patient verbally expressed understanding.   I offered patient to speak to a nurse today to gain clinical advise about her emotions, patient declined.  I offered patient to be put on Lucie Leather (PCP) schedule on Monday upon her arrival from vacation, patient declined due to not wanting to leave husband.   I advised patient that should she get to a point where she needs to go to the emergency room that she does so.   Patient verbally expressed understanding.

## 2016-12-18 ENCOUNTER — Encounter: Payer: Self-pay | Admitting: Family Medicine

## 2016-12-18 NOTE — Telephone Encounter (Signed)
Called and spoke with patient. Her husband passed away December 30, 2016. He had to be transferred to [facility] and was very restless and agitated. She is doing ok since his death. Requests names of therapists in [facility] network. I will send to her via Mychart. Encouraged her to follow up if significant change in mood or other concerns.

## 2017-02-07 ENCOUNTER — Other Ambulatory Visit: Payer: Self-pay | Admitting: Family Medicine

## 2017-02-07 DIAGNOSIS — R9401 Abnormal electroencephalogram [EEG]: Secondary | ICD-10-CM

## 2017-02-07 DIAGNOSIS — G43009 Migraine without aura, not intractable, without status migrainosus: Secondary | ICD-10-CM

## 2017-03-01 ENCOUNTER — Ambulatory Visit (INDEPENDENT_AMBULATORY_CARE_PROVIDER_SITE_OTHER): Payer: 59 | Admitting: Psychology

## 2017-03-01 ENCOUNTER — Telehealth: Payer: Self-pay | Admitting: Family Medicine

## 2017-03-01 DIAGNOSIS — F320 Major depressive disorder, single episode, mild: Secondary | ICD-10-CM

## 2017-03-01 NOTE — Telephone Encounter (Signed)
Patient requesting new scripts for:      ALPRAZolam (XANAX) 0.5 mg tablet sertraline (ZOLOFT) 50 MG tablet clonazePAM (KLONOPIN) 1 MG tablet  She also would like the  levETIRAcetam (KEPPRA) 500 MG tablet, and says it's ok to switch to 750mg  tablets, 2 tablets twice a day.   She also needs a refill on SUMAtriptan (IMITREX) 6 MG/0.5ML SOLN injection. She never picked it up from the original script in March.  Thank you,  -LL

## 2017-03-02 ENCOUNTER — Other Ambulatory Visit: Payer: Self-pay | Admitting: Family Medicine

## 2017-03-02 DIAGNOSIS — R9401 Abnormal electroencephalogram [EEG]: Secondary | ICD-10-CM

## 2017-03-02 MED ORDER — LEVETIRACETAM 750 MG PO TABS: 1500.0000 mg | ORAL_TABLET | Freq: Two times a day (BID) | ORAL | 1 refills | Status: DC

## 2017-03-02 MED ORDER — SERTRALINE HCL 100 MG PO TABS: ORAL_TABLET | ORAL | 11 refills | Status: DC

## 2017-03-02 MED FILL — SERTRALINE HCL 100 MG TAB: 100 | 90 days supply | Qty: 135 | Fill #0

## 2017-03-02 MED FILL — levETIRAcetam 750 MG TABS: 750 | 45 days supply | Qty: 180 | Fill #0

## 2017-03-02 NOTE — Telephone Encounter (Signed)
Called and left voicemail for pt to return call to office.  

## 2017-03-02 NOTE — Telephone Encounter (Signed)
Please Advise

## 2017-03-02 NOTE — Telephone Encounter (Signed)
Please call patient and tell her that I have refilled her sertraline and levetiracetam (Keppra). I have not prescribed her alprazolam or clonazepam in the past, she would need an office visit. Also, I sent in 26 vials of sumatriptan 3 months ago, is she out already? If she is having migraines that frequently, she needs to go back to neuro.

## 2017-03-05 NOTE — Telephone Encounter (Signed)
Called and spoke with pt. Patient is scheduled for a follow up on medications 03/09/17 at 9:15am. Nothing further needed at this time.

## 2017-03-05 NOTE — Telephone Encounter (Signed)
Patient returning phone call. Transferred the call to [place].

## 2017-03-09 ENCOUNTER — Ambulatory Visit (INDEPENDENT_AMBULATORY_CARE_PROVIDER_SITE_OTHER): Payer: 59 | Admitting: Family Medicine

## 2017-03-09 ENCOUNTER — Encounter: Payer: Self-pay | Admitting: Family Medicine

## 2017-03-09 VITALS — BP 124/76 | HR 74 | Temp 98.1°F | Wt 162.8 lb

## 2017-03-09 DIAGNOSIS — M8080XD Other osteoporosis with current pathological fracture, unspecified site, subsequent encounter for fracture with routine healing: Secondary | ICD-10-CM | POA: Diagnosis not present

## 2017-03-09 DIAGNOSIS — F5104 Psychophysiologic insomnia: Secondary | ICD-10-CM | POA: Diagnosis not present

## 2017-03-09 DIAGNOSIS — F419 Anxiety disorder, unspecified: Secondary | ICD-10-CM | POA: Diagnosis not present

## 2017-03-09 DIAGNOSIS — G43009 Migraine without aura, not intractable, without status migrainosus: Secondary | ICD-10-CM | POA: Diagnosis not present

## 2017-03-09 MED ORDER — CLONAZEPAM 1 MG PO TABS: 1.0000 mg | ORAL_TABLET | Freq: Every evening | ORAL | 2 refills | Status: DC | PRN

## 2017-03-09 MED ORDER — ALPRAZOLAM 0.25 MG PO TABS: 0.2500 mg | ORAL_TABLET | Freq: Two times a day (BID) | ORAL | 0 refills | Status: DC | PRN

## 2017-03-09 MED ORDER — SERTRALINE HCL 100 MG PO TABS: ORAL_TABLET | ORAL | 1 refills | Status: DC

## 2017-03-09 NOTE — Patient Instructions (Addendum)
Please add some exercise/stretching to your week  For implants- Jamie Kato
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
## 2017-03-12 MED FILL — SUMATRIPTAN 6 MG/0.5 ML VIA: 6 | 70 days supply | Qty: 5 | Fill #1

## 2017-03-12 MED FILL — clonazePAM 1 MG TABS: 1 | 30 days supply | Qty: 30 | Fill #0

## 2017-03-12 MED FILL — ALPRAZolam 0.25 MG TABS: 0.25 | 15 days supply | Qty: 30 | Fill #0

## 2017-03-16 ENCOUNTER — Ambulatory Visit (INDEPENDENT_AMBULATORY_CARE_PROVIDER_SITE_OTHER): Payer: 59 | Admitting: Family Medicine

## 2017-03-16 ENCOUNTER — Encounter: Payer: Self-pay | Admitting: Family Medicine

## 2017-03-16 VITALS — BP 128/70 | HR 64 | Temp 97.5°F | Wt 162.9 lb

## 2017-03-16 DIAGNOSIS — G43709 Chronic migraine without aura, not intractable, without status migrainosus: Secondary | ICD-10-CM | POA: Diagnosis not present

## 2017-03-16 MED ORDER — KETOROLAC TROMETHAMINE 30 MG/ML IJ SOLN: 30.0000 mg | Freq: Once | INTRAMUSCULAR | Status: DC

## 2017-03-16 MED ORDER — KETOROLAC TROMETHAMINE 60 MG/2ML IM SOLN
30.0000 mg | Freq: Once | INTRAMUSCULAR | Status: AC
Start: 2017-03-16 — End: 2017-03-16
  Administered 2017-03-16: 30 mg via INTRAMUSCULAR

## 2017-03-16 NOTE — Patient Instructions (Signed)
Migraine Headache A migraine headache is an intense, throbbing pain on one side or both sides of the head. Migraines may also cause other symptoms, such as nausea, vomiting, and sensitivity to light and noise. What are the causes? Doing or taking certain things may also trigger migraines, such as:  Alcohol.  Smoking.  Medicines, such as: ? Medicine used to treat chest pain (nitroglycerine). ? Birth control pills. ? Estrogen pills. ? Certain blood pressure medicines.  Aged cheeses, chocolate, or caffeine.  Foods or drinks that contain nitrates, glutamate, aspartame, or tyramine.  Physical activity.  Other things that may trigger a migraine include:  Menstruation.  Pregnancy.  Hunger.  Stress, lack of sleep, too much sleep, or fatigue.  Weather changes.  What increases the risk? The following factors may make you more likely to experience migraine headaches:  Age. Risk increases with age.  Family history of migraine headaches.  Being Caucasian.  Depression and anxiety.  Obesity.  Being a woman.  Having a hole in the heart (patent foramen ovale) or other heart problems.  What are the signs or symptoms? The main symptom of this condition is pulsating or throbbing pain. Pain may:  Happen in any area of the head, such as on one side or both sides.  Interfere with daily activities.  Get worse with physical activity.  Get worse with exposure to bright lights or loud noises.  Other symptoms may include:  Nausea.  Vomiting.  Dizziness.  General sensitivity to bright lights, loud noises, or smells.  Before you get a migraine, you may get warning signs that a migraine is developing (aura). An aura may include:  Seeing flashing lights or having blind spots.  Seeing bright spots, halos, or zigzag lines.  Having tunnel vision or blurred vision.  Having numbness or a tingling feeling.  Having trouble talking.  Having muscle weakness.  How is this  diagnosed? A migraine headache can be diagnosed based on:  Your symptoms.  A physical exam.  Tests, such as CT scan or MRI of the head. These imaging tests can help rule out other causes of headaches.  Taking fluid from the spine (lumbar puncture) and analyzing it (cerebrospinal fluid analysis, or CSF analysis).  How is this treated? A migraine headache is usually treated with medicines that:  Relieve pain.  Relieve nausea.  Prevent migraines from coming back.  Treatment may also include:  Acupuncture.  Lifestyle changes like avoiding foods that trigger migraines.  Follow these instructions at home: Medicines  Take over-the-counter and prescription medicines only as told by your health care provider.  Do not drive or use heavy machinery while taking prescription pain medicine.  To prevent or treat constipation while you are taking prescription pain medicine, your health care provider may recommend that you: ? Drink enough fluid to keep your urine clear or pale yellow. ? Take over-the-counter or prescription medicines. ? Eat foods that are high in fiber, such as fresh fruits and vegetables, whole grains, and beans. ? Limit foods that are high in fat and processed sugars, such as fried and sweet foods. Lifestyle  Avoid alcohol use.  Do not use any products that contain nicotine or tobacco, such as cigarettes and e-cigarettes. If you need help quitting, ask your health care provider.  Get at least 8 hours of sleep every night.  Limit your stress. General instructions   Keep a journal to find out what may trigger your migraine headaches. For example, write down: ? What you eat and   drink. ? How much sleep you get. ? Any change to your diet or medicines.  If you have a migraine: ? Avoid things that make your symptoms worse, such as bright lights. ? It may help to lie down in a dark, quiet room. ? Do not drive or use heavy machinery. ? Ask your health care provider  what activities are safe for you while you are experiencing symptoms.  Keep all follow-up visits as told by your health care provider. This is important. Contact a health care provider if:  You develop symptoms that are different or more severe than your usual migraine symptoms. Get help right away if:  Your migraine becomes severe.  You have a fever.  You have a stiff neck.  You have vision loss.  Your muscles feel weak or like you cannot control them.  You start to lose your balance often.  You develop trouble walking.  You faint. This information is not intended to replace advice given to you by your health care provider. Make sure you discuss any questions you have with your health care provider. Document Released: 08/28/2005 Document Revised: 03/17/2016 Document Reviewed: 02/14/2016 Elsevier Interactive Patient Education  2017 Elsevier Inc.   
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
## 2017-04-16 ENCOUNTER — Other Ambulatory Visit: Payer: Self-pay | Admitting: Family Medicine

## 2017-04-16 DIAGNOSIS — R9401 Abnormal electroencephalogram [EEG]: Secondary | ICD-10-CM

## 2017-04-17 ENCOUNTER — Encounter (HOSPITAL_COMMUNITY): Payer: Self-pay

## 2017-04-17 ENCOUNTER — Ambulatory Visit (HOSPITAL_COMMUNITY)
Admission: EM | Admit: 2017-04-17 | Discharge: 2017-04-17 | Disposition: A | Discharge status: Home / Self Care | Payer: 59 | Attending: Family Medicine | Admitting: Family Medicine

## 2017-04-17 DIAGNOSIS — L989 Disorder of the skin and subcutaneous tissue, unspecified: Secondary | ICD-10-CM

## 2017-04-17 NOTE — Discharge Instructions (Signed)
Take ibuprofen 600-800mg  three times a day or naproxen 440mg  twice a day for pain and inflammation. Ice compress to help with swelling. Avoid tight shoes/new shoes. If symptoms do not go away, worsening symptoms, increased redness/swelling/warmth follow up with PCP for further evaluation.
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
## 2017-04-17 NOTE — ED Triage Notes (Signed)
Patient presents to [facility] with rt foot pain, pt states she had an ingrown toenails on great toe and today she has developed an knot on the top of her rt foot and is in pain, pt denies any injury

## 2017-04-18 ENCOUNTER — Telehealth: Payer: Self-pay | Admitting: Family Medicine

## 2017-04-18 ENCOUNTER — Ambulatory Visit (INDEPENDENT_AMBULATORY_CARE_PROVIDER_SITE_OTHER): Payer: 59 | Admitting: Family Medicine

## 2017-04-18 VITALS — BP 126/70 | HR 71 | Temp 98.0°F | Wt 165.6 lb

## 2017-04-18 DIAGNOSIS — L608 Other nail disorders: Secondary | ICD-10-CM | POA: Diagnosis not present

## 2017-04-18 DIAGNOSIS — L989 Disorder of the skin and subcutaneous tissue, unspecified: Secondary | ICD-10-CM | POA: Diagnosis not present

## 2017-04-18 DIAGNOSIS — M79671 Pain in right foot: Secondary | ICD-10-CM

## 2017-04-18 NOTE — Telephone Encounter (Signed)
Noted  

## 2017-04-18 NOTE — Telephone Encounter (Signed)
Patient scheduled today at 4pm with Tor Netters, NP for a painful raised bump on her R foot.
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
## 2017-05-02 ENCOUNTER — Ambulatory Visit (INDEPENDENT_AMBULATORY_CARE_PROVIDER_SITE_OTHER): Payer: 59

## 2017-05-02 ENCOUNTER — Encounter: Payer: Self-pay | Admitting: Podiatry

## 2017-05-02 ENCOUNTER — Ambulatory Visit (INDEPENDENT_AMBULATORY_CARE_PROVIDER_SITE_OTHER): Payer: 59 | Admitting: Podiatry

## 2017-05-02 DIAGNOSIS — M779 Enthesopathy, unspecified: Secondary | ICD-10-CM | POA: Diagnosis not present

## 2017-05-02 DIAGNOSIS — M6740 Ganglion, unspecified site: Secondary | ICD-10-CM

## 2017-05-02 DIAGNOSIS — L600 Ingrowing nail: Secondary | ICD-10-CM | POA: Diagnosis not present

## 2017-05-02 MED ORDER — TRIAMCINOLONE ACETONIDE 10 MG/ML IJ SUSP
10.0000 mg | Freq: Once | INTRAMUSCULAR | Status: AC
  Administered 2017-05-02: 10 mg

## 2017-05-02 NOTE — Progress Notes (Signed)
   Subjective:    Patient ID: Kathryn [name], female    DOB: 03/23/1961, 56 y.o.   MRN: [id number]  HPI Chief Complaint  Patient presents with  . Foot Pain    Right foot; dorsal; knot on top of foot; pt stated, "Went to urgent care a couple of weeks ago and was told take ibuprofen"     Review of Systems  Constitutional: Positive for fatigue and unexpected weight change.  Eyes: Positive for itching.  Musculoskeletal: Positive for back pain.  Allergic/Immunologic: Positive for food allergies.  Neurological: Positive for seizures.  Psychiatric/Behavioral: The patient is nervous/anxious.   All other systems reviewed and are negative.      Objective:   Physical Exam        Assessment & Plan:

## 2017-05-09 ENCOUNTER — Encounter: Payer: Self-pay | Admitting: Neurology

## 2017-05-15 ENCOUNTER — Telehealth: Payer: Self-pay | Admitting: Family Medicine

## 2017-05-15 NOTE — Telephone Encounter (Signed)
Patient states that sumatriptan is not helping her migraine. She has had a migraine for the last week. She wants to know if tylenol 3 or feurocet can be called in. Also, states that she cannot see a neurologist until the end of the year because of neurologist availability. Patient has even tried goody powders. She said they are not helping any longer.

## 2017-05-16 ENCOUNTER — Ambulatory Visit (INDEPENDENT_AMBULATORY_CARE_PROVIDER_SITE_OTHER): Payer: 59 | Admitting: Family Medicine

## 2017-05-16 ENCOUNTER — Encounter: Payer: Self-pay | Admitting: Family Medicine

## 2017-05-16 VITALS — BP 128/76 | HR 69 | Temp 97.8°F | Wt 165.0 lb

## 2017-05-16 DIAGNOSIS — R9401 Abnormal electroencephalogram [EEG]: Secondary | ICD-10-CM | POA: Diagnosis not present

## 2017-05-16 DIAGNOSIS — G43709 Chronic migraine without aura, not intractable, without status migrainosus: Secondary | ICD-10-CM

## 2017-05-16 DIAGNOSIS — Z23 Encounter for immunization: Secondary | ICD-10-CM | POA: Diagnosis not present

## 2017-05-16 MED ORDER — LEVETIRACETAM 750 MG PO TABS: 1500.0000 mg | ORAL_TABLET | Freq: Two times a day (BID) | ORAL | 1 refills | Status: DC

## 2017-05-16 MED ORDER — METOPROLOL SUCCINATE ER 50 MG PO TB24: 50.0000 mg | ORAL_TABLET | Freq: Every day | ORAL | 1 refills | Status: DC

## 2017-05-16 MED FILL — METOPROLOL SUCC ER 50 MG TA: 50 | 90 days supply | Qty: 90 | Fill #0

## 2017-05-16 MED FILL — levETIRAcetam 750 MG TABS: 750 | 68 days supply | Qty: 270 | Fill #0 | Status: TO

## 2017-05-16 NOTE — Patient Instructions (Signed)
I have put in a referral to the headache clinic for you  I have sent metoprolol to your pharmacy for migraine prevention- let me know how you are doing in 1-2 weeks  Have a great trip!

## 2017-05-16 NOTE — Telephone Encounter (Signed)
Patient seen in clinic 05/16/17.
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
## 2017-05-23 DIAGNOSIS — Z049 Encounter for examination and observation for unspecified reason: Secondary | ICD-10-CM | POA: Diagnosis not present

## 2017-05-23 DIAGNOSIS — G43719 Chronic migraine without aura, intractable, without status migrainosus: Secondary | ICD-10-CM | POA: Diagnosis not present

## 2017-05-23 DIAGNOSIS — R51 Headache: Secondary | ICD-10-CM | POA: Diagnosis not present

## 2017-05-23 DIAGNOSIS — Z79899 Other long term (current) drug therapy: Secondary | ICD-10-CM | POA: Diagnosis not present

## 2017-05-23 MED FILL — ZONISAMIDE 25 MG CAPSULE: 25 | 30 days supply | Qty: 120 | Fill #0

## 2017-05-23 MED FILL — BACLOFEN 10 MG TABS: 10 | 35 days supply | Qty: 20 | Fill #0

## 2017-05-23 MED FILL — SUMATRIPTAN 6 MG/0.5 ML VIA: 6 | 42 days supply | Qty: 3 | Fill #2

## 2017-05-24 DIAGNOSIS — M542 Cervicalgia: Secondary | ICD-10-CM | POA: Diagnosis not present

## 2017-05-24 DIAGNOSIS — G43719 Chronic migraine without aura, intractable, without status migrainosus: Secondary | ICD-10-CM | POA: Diagnosis not present

## 2017-05-24 DIAGNOSIS — M791 Myalgia: Secondary | ICD-10-CM | POA: Diagnosis not present

## 2017-05-24 DIAGNOSIS — R51 Headache: Secondary | ICD-10-CM | POA: Diagnosis not present

## 2017-06-01 ENCOUNTER — Ambulatory Visit (INDEPENDENT_AMBULATORY_CARE_PROVIDER_SITE_OTHER): Payer: 59 | Admitting: Podiatry

## 2017-06-01 ENCOUNTER — Encounter: Payer: Self-pay | Admitting: Podiatry

## 2017-06-01 VITALS — BP 140/84 | HR 60 | Resp 16

## 2017-06-01 DIAGNOSIS — L600 Ingrowing nail: Secondary | ICD-10-CM | POA: Diagnosis not present

## 2017-06-01 NOTE — Patient Instructions (Signed)

## 2017-06-01 NOTE — Progress Notes (Signed)
Subjective:    Patient ID: Kathryn [name], female   DOB: 56 y.o.   MRN: [id number]   HPI patient presents with chronic incurvation of the nails bilateral both medial lateral borders that are painful    ROS      Objective:  Physical Exam neurovascular status intact with incurvated medial lateral borders hallux bilateral painful     Assessment:  Ingrown toenail deformity hallux bilateral medial lateral borders      Plan:    H&P discussed condition and recommended removal of the corners explaining procedure risk and allowed her to read and signed consent form. I then infiltrated each hallux 60 g like Marcaine mixture remove the borders exposed matrix and applied phenol 3 applications 30 seconds followed by alcohol lavaged sterile dressing. Gave instructions on soaks and reappoint

## 2017-06-06 ENCOUNTER — Encounter: Payer: Self-pay | Admitting: Family Medicine

## 2017-06-06 ENCOUNTER — Other Ambulatory Visit: Payer: Self-pay | Admitting: Physician Assistant

## 2017-06-06 MED ORDER — SERTRALINE HCL 100 MG PO TABS: ORAL_TABLET | ORAL | 1 refills | Status: DC

## 2017-06-11 ENCOUNTER — Other Ambulatory Visit: Payer: Self-pay | Admitting: Family Medicine

## 2017-06-11 DIAGNOSIS — F419 Anxiety disorder, unspecified: Secondary | ICD-10-CM

## 2017-06-11 DIAGNOSIS — G43009 Migraine without aura, not intractable, without status migrainosus: Secondary | ICD-10-CM

## 2017-06-12 DIAGNOSIS — M542 Cervicalgia: Secondary | ICD-10-CM | POA: Diagnosis not present

## 2017-06-12 DIAGNOSIS — G43719 Chronic migraine without aura, intractable, without status migrainosus: Secondary | ICD-10-CM | POA: Diagnosis not present

## 2017-06-12 DIAGNOSIS — R51 Headache: Secondary | ICD-10-CM | POA: Diagnosis not present

## 2017-06-12 DIAGNOSIS — M791 Myalgia, unspecified site: Secondary | ICD-10-CM | POA: Diagnosis not present

## 2017-06-12 MED FILL — PROMETHAZINE 25 MG TABLET: 25 | 2 days supply | Qty: 15 | Fill #0

## 2017-06-14 MED ORDER — SUMATRIPTAN SUCCINATE 6 MG/0.5ML ~~LOC~~ SOLN: 6.0000 mg | SUBCUTANEOUS | 0 refills | Status: DC | PRN

## 2017-06-14 MED ORDER — ALPRAZOLAM 0.25 MG PO TABS: 0.2500 mg | ORAL_TABLET | Freq: Two times a day (BID) | ORAL | 0 refills | Status: DC | PRN

## 2017-06-14 NOTE — Telephone Encounter (Signed)
Please call in alprazolam to patient's pharmacy.

## 2017-06-15 ENCOUNTER — Other Ambulatory Visit: Payer: Self-pay | Admitting: Family Medicine

## 2017-06-15 DIAGNOSIS — F419 Anxiety disorder, unspecified: Secondary | ICD-10-CM

## 2017-06-15 MED FILL — SERTRALINE HCL 100 MG TAB: 100 | 90 days supply | Qty: 135 | Fill #0 | Status: TO

## 2017-06-15 NOTE — Telephone Encounter (Signed)
R/X phoned in per previous order on 06/14/17.  Called into [facility].

## 2017-07-09 DIAGNOSIS — G43719 Chronic migraine without aura, intractable, without status migrainosus: Secondary | ICD-10-CM | POA: Diagnosis not present

## 2017-07-09 DIAGNOSIS — M791 Myalgia, unspecified site: Secondary | ICD-10-CM | POA: Diagnosis not present

## 2017-07-09 DIAGNOSIS — R51 Headache: Secondary | ICD-10-CM | POA: Diagnosis not present

## 2017-07-09 DIAGNOSIS — M542 Cervicalgia: Secondary | ICD-10-CM | POA: Diagnosis not present

## 2017-07-09 MED FILL — BACLOFEN 10 MG TABLET: 10 | 35 days supply | Qty: 20 | Fill #0

## 2017-07-09 MED FILL — ZONISAMIDE 25 MG CAPSULE: 25 | 30 days supply | Qty: 120 | Fill #0 | Status: TO

## 2017-07-11 MED FILL — SUMATRIPTAN 6 MG/0.5 ML INJ: 6 | 15 days supply | Qty: 1 | Fill #0 | Status: TO

## 2017-07-20 ENCOUNTER — Telehealth: Payer: Self-pay | Admitting: Family Medicine

## 2017-07-20 ENCOUNTER — Ambulatory Visit (INDEPENDENT_AMBULATORY_CARE_PROVIDER_SITE_OTHER): Payer: 59 | Admitting: Family Medicine

## 2017-07-20 ENCOUNTER — Encounter: Payer: Self-pay | Admitting: Family Medicine

## 2017-07-20 VITALS — BP 120/80 | HR 64 | Ht 62.6 in | Wt 168.0 lb

## 2017-07-20 DIAGNOSIS — G43011 Migraine without aura, intractable, with status migrainosus: Secondary | ICD-10-CM | POA: Diagnosis not present

## 2017-07-20 DIAGNOSIS — Z1239 Encounter for other screening for malignant neoplasm of breast: Secondary | ICD-10-CM

## 2017-07-20 MED ORDER — KETOROLAC TROMETHAMINE 60 MG/2ML IM SOLN
60.0000 mg | Freq: Once | INTRAMUSCULAR | Status: AC
  Administered 2017-07-20: 60 mg via INTRAMUSCULAR

## 2017-07-20 NOTE — Progress Notes (Signed)
   Subjective:  Kathryn [name] is a 56 y.o. female who presents today with a chief complaint of migraine.   HPI:  Migraine, acute issue Patient with a history of migraines.  Current migraine started about a week ago.  Has been daily.  She sees a headache specialist for this however has not been able to see them recently.  She took a BC powder which did not centrically seem to help.  Headache is worsened over the last couple of days.  Feels like her typical migraine.  Has tried sumatriptan which helps a little bit but has not broken the headache.  States the only thing that usually helps is a shot of Toradol.  No weakness or numbness.  Some photophobia.  No vision changes.  ROS: Per HPI  PMH: Smoking history reviewed.  Never smoker.  Objective:  Physical Exam: BP 120/80   Pulse 64   Ht 5' 2.6" (1.59 m)   Wt 168 lb (76.2 kg)   LMP 09/11/2001   SpO2 98%   BMI 30.14 kg/m   Gen: NAD, resting comfortably CV: RRR with no murmurs appreciated Pulm: NWOB, CTAB with no crackles, wheezes, or rhonchi GI: Normal bowel sounds present. Soft, Nontender, Nondistended. MSK: No edema, cyanosis, or clubbing noted Skin: Warm, dry Neuro: Cranial nerves II through XII intact.  Strength 5 out of 5 in upper and lower extremities.  Sensation light touch grossly intact throughout.  Finger-nose-finger testing intact bilaterally.  Reflexes 2+ and symmetric bilaterally. Psych: Normal affect and thought content  Assessment/Plan:  Migraine, acute issue No red flag signs or symptoms.  Current migraine is consistent with her prior migraines.  Toradol 60 mg IM given today.  Offered Phenergan or Benadryl prescription however patient deferred.  Advise close follow-up with her headache specialist.  Return precautions reviewed.  Algis Greenhouse. Jerline Pain, MD 07/20/2017 5:31 PM

## 2017-07-20 NOTE — Telephone Encounter (Signed)
Patient needs to know if she needs a referral to get a mammogram. Patient states that she saw Dr. Jerline [name] today for an acute issue however, [name] told her that she could see Dr. Juleen [name] for her transfer of PCP. Please approve transfer and advise patient on referral for mammogram.

## 2017-07-23 NOTE — Telephone Encounter (Signed)
Tried to call patient to set up an appointment with Dr. Juleen China no voicemail set up. Placed mammogram order.

## 2017-07-23 NOTE — Telephone Encounter (Signed)
For clarification, patient is wanting to transfer/etablish care with Dr. Briscoe Deutscher. Patient only saw Dr. Jerline Pain for an acute issue. Someone on Dr. Juleen China staff would need to call patient to advise on the referral for mammography.

## 2017-07-26 MED FILL — levETIRAcetam 750 MG TABS: 750 | 68 days supply | Qty: 270 | Fill #0

## 2017-07-26 MED FILL — SUMATRIPTAN 6 MG/0.5 ML REF: 6 | 25 days supply | Qty: 3 | Fill #0

## 2017-08-13 ENCOUNTER — Ambulatory Visit (INDEPENDENT_AMBULATORY_CARE_PROVIDER_SITE_OTHER): Payer: 59 | Admitting: Family Medicine

## 2017-08-13 ENCOUNTER — Encounter: Payer: Self-pay | Admitting: Family Medicine

## 2017-08-13 DIAGNOSIS — F5104 Psychophysiologic insomnia: Secondary | ICD-10-CM

## 2017-08-13 DIAGNOSIS — F411 Generalized anxiety disorder: Secondary | ICD-10-CM

## 2017-08-13 DIAGNOSIS — F339 Major depressive disorder, recurrent, unspecified: Secondary | ICD-10-CM

## 2017-08-13 MED ORDER — CLONAZEPAM 1 MG PO TABS: 1.0000 mg | ORAL_TABLET | Freq: Every evening | ORAL | 0 refills | Status: DC | PRN

## 2017-08-13 NOTE — Patient Instructions (Signed)
I will refill your klonopin.  I will also send in a referral to psychiatry.   Please come back soon to meet with Dr Juleen China.  Take care,  Dr Jerline Pain

## 2017-08-13 NOTE — Assessment & Plan Note (Signed)
Referral to psychiatry placed.  Continue Zoloft and Xanax.

## 2017-08-13 NOTE — Assessment & Plan Note (Signed)
Klonopin refilled today.  Database reviewed without red flags.

## 2017-08-13 NOTE — Progress Notes (Signed)
   Subjective:  Kathryn [name] is a 56 y.o. female who presents today with a chief complaint of insomnia.   HPI:  Insomnia, Established Problem, stable Several year history.  On Klonopin 1 mg as needed.  Depression, established problem, worsening She has been seen by psychiatry for this in the past, however would like to see a new psychiatrist.  She has been dealing with the unexpected loss of her husband for the past several months which has worsened her symptoms.  She has seen our behavioral therapist which is helped some.  Currently on Zoloft 150 mg daily.  She has been on other medications for depression in the past, however does not want to switch or change anything today.  No SI or HI.  Anxiety, established problem, stable Current medications include Zoloft 150 mg daily, Xanax 0.5 mg as needed.  No SI or HI.  ROS: Per HPI  PMH: Smoking history reviewed.  Non-smoker.  Objective:  Physical Exam: BP 116/79   Pulse 78   Ht 5' 2.6" (1.59 m)   Wt 170 lb (77.1 kg)   LMP 09/11/2001   SpO2 97%   BMI 30.50 kg/m   Gen: NAD, resting comfortably Neuro: Grossly normal, moves all extremities Psych: Normal affect and thought content  Assessment/Plan:  Depression, recurrent ([place]) Referral to psychiatry placed.  GAD (generalized anxiety disorder) Referral to psychiatry placed.  Continue Zoloft and Xanax.  Chronic insomnia Klonopin refilled today.  Database reviewed without red flags.   Algis Greenhouse. Jerline Pain, MD 08/13/2017 5:14 PM

## 2017-08-13 NOTE — Assessment & Plan Note (Signed)
Referral to psychiatry placed.

## 2017-08-15 DIAGNOSIS — G43719 Chronic migraine without aura, intractable, without status migrainosus: Secondary | ICD-10-CM | POA: Diagnosis not present

## 2017-08-17 ENCOUNTER — Ambulatory Visit: Payer: 59 | Admitting: Neurology

## 2017-08-17 ENCOUNTER — Encounter: Payer: Self-pay | Admitting: Neurology

## 2017-08-17 VITALS — BP 100/76 | HR 92 | Ht 62.0 in | Wt 167.2 lb

## 2017-08-17 DIAGNOSIS — Z87898 Personal history of other specified conditions: Secondary | ICD-10-CM

## 2017-08-17 DIAGNOSIS — G43719 Chronic migraine without aura, intractable, without status migrainosus: Secondary | ICD-10-CM

## 2017-08-17 MED ORDER — FREMANEZUMAB-VFRM 225 MG/1.5ML ~~LOC~~ SOSY: 225.0000 mg | PREFILLED_SYRINGE | Freq: Once | SUBCUTANEOUS | Status: AC
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
## 2017-08-17 NOTE — Patient Instructions (Addendum)
Migraine Recommendations: 1.  We will start one of the new migraine preventative medications, Ajovy 2.  Take sumatriptan at earliest onset of headache.  May repeat dose once in 2 hours if needed.  Do not exceed two tablets in 24 hours. 3.  Limit use of pain relievers to no more than 2 days out of the week.  These medications include acetaminophen, ibuprofen, triptans and narcotics.  This will help reduce risk of rebound headaches. 4.  Be aware of common food triggers such as processed sweets, processed foods with nitrites (such as deli meat, hot dogs, sausages), foods with MSG, alcohol (such as wine), chocolate, certain cheeses, certain fruits (dried fruits, bananas, some citrus fruit), vinegar, diet soda. 4.  Avoid caffeine 5.  Routine exercise 6.  Proper sleep hygiene 7.  Stay adequately hydrated with water 8.  Keep a headache diary. 9.  Maintain proper stress management. 10.  Do not skip meals. 11.  Consider supplements:  Magnesium citrate 400mg  to 600mg  daily, riboflavin 400mg , Coenzyme Q 10 100mg  three times daily 12.  Continue Keppra as prescribed. 13.  Follow up in 3 months.

## 2017-08-17 NOTE — Addendum Note (Signed)
Addended by: Clois Comber on: 08/17/2017 11:02 AM   Modules accepted: Orders

## 2017-09-21 ENCOUNTER — Telehealth: Payer: Self-pay

## 2017-09-21 ENCOUNTER — Telehealth: Payer: Self-pay | Admitting: Neurology

## 2017-09-21 NOTE — Telephone Encounter (Signed)
Called and LM for Pt to stop by and get sample, but the Rx had been called in to [facility]

## 2017-09-21 NOTE — Telephone Encounter (Signed)
Called and spoke with Pt. She had not tried to use the access card given to her, she was unaware an Rx had been sent in. She will call with any further questions

## 2017-09-21 NOTE — Telephone Encounter (Signed)
Patient wants to know if she can come by and get a sample of the Ajovy and find out about the status of her ins for the medication

## 2017-09-21 NOTE — Telephone Encounter (Signed)
ERROR pls see previous phone encounter    

## 2017-09-21 NOTE — Telephone Encounter (Signed)
Received VM from pt stating that Kathryn [name] does not have an Rx for her.  It was unclear if she said that they never received it, or if they are out-of-stock.  She is wondering if she can "just come in and get a shot" (I am assuming she is talking about Ajovy)  Pt can be reached at [phone]

## 2017-09-26 ENCOUNTER — Ambulatory Visit: Payer: Self-pay

## 2017-09-26 MED ORDER — FREMANEZUMAB-VFRM 225 MG/1.5ML ~~LOC~~ SOSY: 225.0000 mg | PREFILLED_SYRINGE | SUBCUTANEOUS | 11 refills | Status: DC

## 2017-10-11 ENCOUNTER — Ambulatory Visit: Payer: 59 | Admitting: Family Medicine

## 2017-10-11 ENCOUNTER — Encounter: Payer: Self-pay | Admitting: Family Medicine

## 2017-10-11 VITALS — BP 112/82 | HR 71 | Ht 62.0 in | Wt 174.6 lb

## 2017-10-11 DIAGNOSIS — R9401 Abnormal electroencephalogram [EEG]: Secondary | ICD-10-CM

## 2017-10-11 DIAGNOSIS — R21 Rash and other nonspecific skin eruption: Secondary | ICD-10-CM

## 2017-10-11 DIAGNOSIS — G43009 Migraine without aura, not intractable, without status migrainosus: Secondary | ICD-10-CM | POA: Diagnosis not present

## 2017-10-11 DIAGNOSIS — J029 Acute pharyngitis, unspecified: Secondary | ICD-10-CM

## 2017-10-11 DIAGNOSIS — R059 Cough, unspecified: Secondary | ICD-10-CM

## 2017-10-11 DIAGNOSIS — R05 Cough: Secondary | ICD-10-CM | POA: Diagnosis not present

## 2017-10-11 LAB — POCT RAPID STREP A (OFFICE): Rapid Strep A Screen: NEGATIVE

## 2017-10-11 MED ORDER — LEVETIRACETAM 750 MG PO TABS: 1500.0000 mg | ORAL_TABLET | Freq: Two times a day (BID) | ORAL | 1 refills | Status: DC

## 2017-10-11 MED ORDER — SUMATRIPTAN SUCCINATE 6 MG/0.5ML ~~LOC~~ SOLN: 6.0000 mg | SUBCUTANEOUS | 0 refills | Status: DC | PRN

## 2017-10-11 MED ORDER — BENZONATATE 200 MG PO CAPS: 200.0000 mg | ORAL_CAPSULE | Freq: Two times a day (BID) | ORAL | 0 refills | Status: DC | PRN

## 2017-10-11 MED ORDER — IPRATROPIUM BROMIDE 0.06 % NA SOLN: 2.0000 | Freq: Four times a day (QID) | NASAL | 0 refills | Status: DC

## 2017-10-11 MED ORDER — METHYLPREDNISOLONE ACETATE 80 MG/ML IJ SUSP
80.0000 mg | Freq: Once | INTRAMUSCULAR | Status: AC
  Administered 2017-10-11: 80 mg via INTRAMUSCULAR

## 2017-10-11 MED ORDER — AZITHROMYCIN 250 MG PO TABS: ORAL_TABLET | ORAL | 0 refills | Status: DC

## 2017-10-11 NOTE — Patient Instructions (Signed)
Start the atrovent.  Start tessalon for your cough.  Start the zpack if your symptoms worsen or do not improve in a few days.  Please stay well hydrated.  You can take tylenol and/or motrin as needed for low grade fever and pain.  Please let me know if your symptoms worsen or fail to improve.  You can try using over-the-counter clotrimazole for your rash.  I will put in a referral to dermatology for you.  Take care, Dr Jerline Pain

## 2017-10-11 NOTE — Progress Notes (Signed)
    Subjective:  Kathryn [name] is a 57 y.o. female who presents today for same-day appointment with a chief complaint of sore throat.   HPI:  Sore Throat, Acute Problem Started yesterday. Worsened over that time. Associated with rhinorrhea, sneeze, and cough. Tried OTC sinus med which has not helped. No chills. Recently on a cruise and flight - no known sick contacts. No other obvious precipitating events. No obvious alleviating or aggravating factors.  Rash, new problem Present for several years.  Worsened over last couple months.  Located on forehead.  Rash is very pruritic.  Has a lot of flaking.  Tried several over-the-counter medications including cortisone cream which has not significantly seem to help.  Would like to be referred to dermatology.  ROS: Per HPI  PMH: She reports that she is a non-smoker but has been exposed to tobacco smoke. she has never used smokeless tobacco. She reports that she does not drink alcohol or use drugs.  Objective:  Physical Exam: BP 112/82 (BP Location: Left Arm, Patient Position: Sitting, Cuff Size: Normal)   Pulse 71   Ht 5\' 2"  (1.575 m)   Wt 174 lb 9.6 oz (79.2 kg)   LMP 09/11/2001   SpO2 97%   BMI 31.93 kg/m   Gen: NAD, resting comfortably HEENT: Right TM with clear effusion.  Left TM clear.  Oropharynx erythematous without exudate.  Nasal mucosa boggy and erythematous bilaterally with clear nasal discharge.  Maxillary sinuses clear to transillumination bilaterally. CV: RRR with no murmurs appreciated Pulm: NWOB, CTAB with no crackles, wheezes, or rhonchi Skin: Flaky, mildly erythematous, confluent rash involving lower forehead and eyebrows.  No surrounding erythema.  No drainage.  Rapid strep negative  Assessment/Plan:  Cough Likely secondary to viral URI. No signs of bacterial infection. Start atrovent for rhinorrhea/sinus congestion. Will give 80mg  IM depo-medrol for sore throat. Start tessalon for cough. Sent in a "pocket  prescription" for azithromycin with strict instruction to not start unless symptoms worse or fail to improve within the next several days. Recommended tylenol and/or motrin as needed for low grade fever and pain. Encouraged good oral hydration. Return precautions reviewed. Follow up as needed.   Rash More consistent with seborrheic dermatitis.  Advised patient to use over-the-counter clotrimazole.  She request dermatology referral for this as well as a few other lesion she wants looked at.  She has seen dermatology in the past, however it is been several years.  This referral was placed today.  Algis Greenhouse. Jerline Pain, MD 10/11/2017 1:31 PM

## 2017-10-19 MED FILL — levETIRAcetam 750 MG TABS: 750 | 90 days supply | Qty: 360 | Fill #0 | Status: TO

## 2017-11-07 ENCOUNTER — Encounter (HOSPITAL_COMMUNITY): Payer: Self-pay | Admitting: Psychiatry

## 2017-11-07 ENCOUNTER — Other Ambulatory Visit: Payer: Self-pay

## 2017-11-07 ENCOUNTER — Ambulatory Visit (HOSPITAL_COMMUNITY): Payer: 59 | Admitting: Psychiatry

## 2017-11-07 VITALS — BP 102/72 | HR 82 | Ht 62.0 in | Wt 170.0 lb

## 2017-11-07 DIAGNOSIS — F603 Borderline personality disorder: Secondary | ICD-10-CM | POA: Diagnosis not present

## 2017-11-07 DIAGNOSIS — R450 Nervousness: Secondary | ICD-10-CM | POA: Diagnosis not present

## 2017-11-07 DIAGNOSIS — Z634 Disappearance and death of family member: Secondary | ICD-10-CM | POA: Diagnosis not present

## 2017-11-07 DIAGNOSIS — F4310 Post-traumatic stress disorder, unspecified: Secondary | ICD-10-CM | POA: Diagnosis not present

## 2017-11-07 DIAGNOSIS — G4700 Insomnia, unspecified: Secondary | ICD-10-CM | POA: Diagnosis not present

## 2017-11-07 DIAGNOSIS — F419 Anxiety disorder, unspecified: Secondary | ICD-10-CM | POA: Diagnosis not present

## 2017-11-07 DIAGNOSIS — Z62810 Personal history of physical and sexual abuse in childhood: Secondary | ICD-10-CM

## 2017-11-07 DIAGNOSIS — F332 Major depressive disorder, recurrent severe without psychotic features: Secondary | ICD-10-CM

## 2017-11-07 MED ORDER — QUETIAPINE FUMARATE 50 MG PO TABS: ORAL_TABLET | ORAL | 1 refills | Status: DC

## 2017-11-07 MED ORDER — SERTRALINE HCL 100 MG PO TABS: 200.0000 mg | ORAL_TABLET | Freq: Every day | ORAL | 1 refills | Status: DC

## 2017-11-07 MED FILL — SERTRALINE HCL 100 MG TAB: 100 | 90 days supply | Qty: 180 | Fill #0 | Status: TO

## 2017-11-07 MED FILL — QUETIAPINE FUMARATE 50 MG T: 50 | 90 days supply | Qty: 180 | Fill #0 | Status: TO

## 2017-11-07 MED FILL — AJOVY 225 MG/1.5ML SOSY: 225 | 30 days supply | Qty: 2 | Fill #0 | Status: TO
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
## 2017-11-07 NOTE — Patient Instructions (Signed)
Increase zoloft to 200 mg daily  Seroquel 50 mg nightly, increase in 1 week to 2 tablets  Get set-up with DBT - call the [employer] program and call the [employer]

## 2017-11-13 DIAGNOSIS — D225 Melanocytic nevi of trunk: Secondary | ICD-10-CM | POA: Diagnosis not present

## 2017-11-13 DIAGNOSIS — L821 Other seborrheic keratosis: Secondary | ICD-10-CM | POA: Diagnosis not present

## 2017-11-13 DIAGNOSIS — L218 Other seborrheic dermatitis: Secondary | ICD-10-CM | POA: Diagnosis not present

## 2017-11-13 DIAGNOSIS — D1801 Hemangioma of skin and subcutaneous tissue: Secondary | ICD-10-CM | POA: Diagnosis not present

## 2017-11-13 MED FILL — KETOCONAZOLE 2% CREAM: 2 | 20 days supply | Qty: 60 | Fill #0

## 2017-11-13 MED FILL — TRIAMCINOLONE 0.025% CREAM: 0.025 | 20 days supply | Qty: 80 | Fill #0

## 2017-11-28 ENCOUNTER — Ambulatory Visit: Payer: Self-pay | Admitting: Neurology

## 2017-12-02 ENCOUNTER — Encounter (HOSPITAL_COMMUNITY): Payer: Self-pay | Admitting: Psychiatry

## 2017-12-10 DIAGNOSIS — K8020 Calculus of gallbladder without cholecystitis without obstruction: Secondary | ICD-10-CM

## 2017-12-10 HISTORY — DX: Calculus of gallbladder without cholecystitis without obstruction: K80.20

## 2017-12-21 ENCOUNTER — Ambulatory Visit: Payer: 59 | Admitting: Neurology

## 2017-12-21 ENCOUNTER — Encounter: Payer: Self-pay | Admitting: Neurology

## 2017-12-21 VITALS — BP 122/70 | HR 76 | Ht 62.0 in | Wt 178.0 lb

## 2017-12-21 DIAGNOSIS — G43009 Migraine without aura, not intractable, without status migrainosus: Secondary | ICD-10-CM | POA: Diagnosis not present

## 2017-12-21 NOTE — Patient Instructions (Signed)
1.  Continue Ajovy 2.  Use sumatriptan as needed, limited to no more than 2 days out of week. 3.  Use headache diary 4.  Follow up in 4 months.
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
## 2017-12-31 MED FILL — AJOVY 225 MG/1.5ML SOSY: 225 | 30 days supply | Qty: 2 | Fill #0

## 2018-01-02 ENCOUNTER — Emergency Department (HOSPITAL_COMMUNITY): Payer: 59

## 2018-01-02 ENCOUNTER — Other Ambulatory Visit: Payer: Self-pay

## 2018-01-02 ENCOUNTER — Encounter (HOSPITAL_COMMUNITY): Payer: Self-pay | Admitting: Emergency Medicine

## 2018-01-02 ENCOUNTER — Emergency Department (HOSPITAL_COMMUNITY)
Admission: EM | Admit: 2018-01-02 | Discharge: 2018-01-03 | Disposition: A | Discharge status: Home / Self Care | Payer: 59 | Attending: Emergency Medicine | Admitting: Emergency Medicine

## 2018-01-02 ENCOUNTER — Ambulatory Visit (HOSPITAL_COMMUNITY)
Admission: EM | Admit: 2018-01-02 | Discharge: 2018-01-02 | Disposition: A | Discharge status: Home / Self Care | Payer: 59 | Source: Home / Self Care

## 2018-01-02 ENCOUNTER — Ambulatory Visit (HOSPITAL_COMMUNITY): Payer: 59 | Admitting: Psychiatry

## 2018-01-02 DIAGNOSIS — R1110 Vomiting, unspecified: Secondary | ICD-10-CM | POA: Diagnosis not present

## 2018-01-02 DIAGNOSIS — R112 Nausea with vomiting, unspecified: Secondary | ICD-10-CM | POA: Insufficient documentation

## 2018-01-02 DIAGNOSIS — R1084 Generalized abdominal pain: Secondary | ICD-10-CM

## 2018-01-02 DIAGNOSIS — R100 Acute abdomen: Secondary | ICD-10-CM | POA: Diagnosis not present

## 2018-01-02 DIAGNOSIS — Z5321 Procedure and treatment not carried out due to patient leaving prior to being seen by health care provider: Secondary | ICD-10-CM | POA: Insufficient documentation

## 2018-01-02 DIAGNOSIS — K8050 Calculus of bile duct without cholangitis or cholecystitis without obstruction: Secondary | ICD-10-CM | POA: Diagnosis not present

## 2018-01-02 DIAGNOSIS — R109 Unspecified abdominal pain: Secondary | ICD-10-CM | POA: Insufficient documentation

## 2018-01-02 LAB — URINALYSIS, ROUTINE W REFLEX MICROSCOPIC
Bacteria, UA: NONE SEEN
Bilirubin Urine: NEGATIVE
Glucose, UA: NEGATIVE mg/dL
Hgb urine dipstick: NEGATIVE
Ketones, ur: 80 mg/dL — AB
Leukocytes, UA: NEGATIVE
Nitrite: NEGATIVE
Protein, ur: 30 mg/dL — AB
Specific Gravity, Urine: 1.029 (ref 1.005–1.030)
pH: 6 (ref 5.0–8.0)

## 2018-01-02 LAB — I-STAT BETA HCG BLOOD, ED (MC, WL, AP ONLY): I-stat hCG, quantitative: <5 m[IU]/mL (ref <5)

## 2018-01-02 LAB — COMPREHENSIVE METABOLIC PANEL
ALT: 17 U/L (ref 14–54)
AST: 21 U/L (ref 15–41)
Albumin: 4.1 g/dL (ref 3.5–5.0)
Alkaline Phosphatase: 63 U/L (ref 38–126)
Anion gap: 13 (ref 5–15)
BUN: 15 mg/dL (ref 6–20)
CO2: 22 mmol/L (ref 22–32)
Calcium: 9.4 mg/dL (ref 8.9–10.3)
Chloride: 103 mmol/L (ref 101–111)
Creatinine, Ser: 0.77 mg/dL (ref 0.44–1.00)
GFR calc Af Amer: >60 mL/min (ref >60)
GFR calc non Af Amer: >60 mL/min (ref >60)
Glucose, Bld: 126 mg/dL — ABNORMAL HIGH (ref 65–99)
Potassium: 3.9 mmol/L (ref 3.5–5.1)
Sodium: 138 mmol/L (ref 135–145)
Total Bilirubin: 0.5 mg/dL (ref 0.3–1.2)
Total Protein: 7.7 g/dL (ref 6.5–8.1)

## 2018-01-02 LAB — CBC
HCT: 37.3 % (ref 36.0–46.0)
Hemoglobin: 12.9 g/dL (ref 12.0–15.0)
MCH: 32.3 pg (ref 26.0–34.0)
MCHC: 34.6 g/dL (ref 30.0–36.0)
MCV: 93.5 fL (ref 78.0–100.0)
Platelets: 241 10*3/uL (ref 150–400)
RBC: 3.99 MIL/uL (ref 3.87–5.11)
RDW: 13.6 % (ref 11.5–15.5)
WBC: 9.4 10*3/uL (ref 4.0–10.5)

## 2018-01-02 LAB — I-STAT TROPONIN, ED: Troponin i, poc: 0 ng/mL (ref 0.00–0.08)

## 2018-01-02 LAB — LIPASE, BLOOD: Lipase: 30 U/L (ref 11–51)

## 2018-01-02 MED ORDER — IOPAMIDOL (ISOVUE-300) INJECTION 61%
INTRAVENOUS | Status: AC
  Filled 2018-01-02: qty 100

## 2018-01-02 MED ORDER — IOPAMIDOL (ISOVUE-300) INJECTION 61%
100.0000 mL | Freq: Once | INTRAVENOUS | Status: AC | PRN
  Administered 2018-01-02: 100 mL via INTRAVENOUS

## 2018-01-02 MED ORDER — METOCLOPRAMIDE HCL 5 MG/ML IJ SOLN
10.0000 mg | Freq: Once | INTRAMUSCULAR | Status: AC
  Administered 2018-01-02: 10 mg via INTRAVENOUS
  Filled 2018-01-02: qty 2

## 2018-01-02 MED ORDER — FENTANYL CITRATE (PF) 100 MCG/2ML IJ SOLN
50.0000 ug | Freq: Once | INTRAMUSCULAR | Status: AC
  Administered 2018-01-02: 50 ug via INTRAVENOUS
  Filled 2018-01-02: qty 2

## 2018-01-02 NOTE — ED Notes (Signed)
Spoke to patient about delays for CT scan.  Reviewed for results.  Awaiting creatinine.  Will continue to monitor

## 2018-01-02 NOTE — ED Notes (Signed)
Called CT.  Labs back.  Will be coming to get patient shortly
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
## 2018-01-02 NOTE — Discharge Instructions (Addendum)
Go directly to the Emergency Department for immediate attention

## 2018-01-02 NOTE — ED Notes (Signed)
Lab states labs not received.  Will recollect

## 2018-01-02 NOTE — ED Provider Notes (Cosign Needed)
Patient placed in Quick Look pathway, seen and evaluated   Chief Complaint: Abdominal pain  HPI:   Patient is a 57 year old female who presents with a 1 day history of abdominal pain.  Patient's pain is worse in the right lower quadrant, however generalized.  It is sharp.  She has had some radiation to the back.  She denies fever.  She has had associated nausea and vomiting, but no diarrhea.  She reports urinary frequency today, but no dysuria.  She was evaluated at urgent care prior to arrival and sent for rule out appendicitis.  She has taken promethazine and Zofran at home without relief of her nausea.  ROS: Positive for abdominal pain, nausea, vomiting, shortness of breath related to pain.  Negative for chest pain.  (one)  Physical Exam:   Gen: No distress  Neuro: Awake and Alert  Skin: Warm    Focused Exam: Heart normal rate, rhythm, lungs clear to auscultation, abdominal tenderness in the right lower quadrant, suprapubic region, and left lower quadrant with guarding, no rigidity, pain with only light touch  CBC, CMP, lipase, UA, CT abdomen pelvis ordered in triage.   Initiation of care has begun. The patient has been counseled on the process, plan, and necessity for staying for the completion/evaluation, and the remainder of the medical screening examination    Frederica Kuster, PA-C 01/02/18 1657

## 2018-01-02 NOTE — ED Triage Notes (Signed)
Pt reports generalized abdominal pain that started last night and she states it is now just on the right side of her abdomen.  She started vomiting this morning about 0600 and has vomited many times.  She states she took 50 mg of promethazine this morning and 8mg  of Zofran at 1230 with no relief.

## 2018-01-02 NOTE — ED Triage Notes (Signed)
Patient presents to ED for assessment of right sided abdominal pain (diffusely tender), c/o nausea and vomiting, denies diarrhea, denies changes in urination.  Sent here from [facility] for appendicitis rule out

## 2018-01-02 NOTE — ED Notes (Signed)
Explained to pt about time her wait on her CT

## 2018-01-03 ENCOUNTER — Encounter: Payer: Self-pay | Admitting: Neurology

## 2018-01-03 ENCOUNTER — Telehealth: Payer: Self-pay

## 2018-01-03 NOTE — Telephone Encounter (Signed)
Called number listed per dpr ok to leave v/m. I have done so letting her know provider is out of office to day but she can call ED and get results from them.

## 2018-01-03 NOTE — ED Notes (Addendum)
Pt no longer willing to wait. Attempted multiple times to get pt to stay to be seen, unsuccessful. LWBS at this time.

## 2018-01-03 NOTE — Telephone Encounter (Signed)
Copied from [place] [id number]. Topic: Quick Communication - Lab Results >> Jan 03, 2018  9:58 AM Antonieta Iba C wrote: Pt says that she was seen at the ED yesterday but left before being told her results. Pt would like to know if office would review and hospital notes /results and call her back to go over them with her?   CB: [phone]

## 2018-01-04 ENCOUNTER — Ambulatory Visit: Payer: 59 | Admitting: Family Medicine

## 2018-01-04 VITALS — BP 128/64 | HR 70 | Temp 98.2°F | Ht 62.0 in | Wt 180.2 lb

## 2018-01-04 DIAGNOSIS — K819 Cholecystitis, unspecified: Secondary | ICD-10-CM
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
## 2018-01-05 ENCOUNTER — Encounter: Payer: Self-pay | Admitting: Family Medicine

## 2018-01-06 ENCOUNTER — Observation Stay (HOSPITAL_COMMUNITY)
Admission: EM | Admit: 2018-01-06 | Discharge: 2018-01-09 | Disposition: A | Discharge status: Home / Self Care | Payer: 59 | Attending: Surgery | Admitting: Surgery

## 2018-01-06 ENCOUNTER — Encounter (HOSPITAL_COMMUNITY): Payer: Self-pay | Admitting: Emergency Medicine

## 2018-01-06 DIAGNOSIS — R1011 Right upper quadrant pain: Secondary | ICD-10-CM

## 2018-01-06 DIAGNOSIS — G4733 Obstructive sleep apnea (adult) (pediatric): Secondary | ICD-10-CM | POA: Diagnosis not present

## 2018-01-06 DIAGNOSIS — Z79899 Other long term (current) drug therapy: Secondary | ICD-10-CM | POA: Diagnosis not present

## 2018-01-06 DIAGNOSIS — F329 Major depressive disorder, single episode, unspecified: Secondary | ICD-10-CM | POA: Insufficient documentation

## 2018-01-06 DIAGNOSIS — R112 Nausea with vomiting, unspecified: Secondary | ICD-10-CM | POA: Diagnosis not present

## 2018-01-06 DIAGNOSIS — K8020 Calculus of gallbladder without cholecystitis without obstruction: Secondary | ICD-10-CM | POA: Diagnosis not present

## 2018-01-06 DIAGNOSIS — K8050 Calculus of bile duct without cholangitis or cholecystitis without obstruction: Secondary | ICD-10-CM | POA: Insufficient documentation

## 2018-01-06 DIAGNOSIS — G2581 Restless legs syndrome: Secondary | ICD-10-CM | POA: Insufficient documentation

## 2018-01-06 DIAGNOSIS — K8012 Calculus of gallbladder with acute and chronic cholecystitis without obstruction: Principal | ICD-10-CM | POA: Insufficient documentation

## 2018-01-06 DIAGNOSIS — R5383 Other fatigue: Secondary | ICD-10-CM | POA: Diagnosis not present

## 2018-01-06 DIAGNOSIS — Z885 Allergy status to narcotic agent status: Secondary | ICD-10-CM | POA: Diagnosis not present

## 2018-01-06 DIAGNOSIS — R6883 Chills (without fever): Secondary | ICD-10-CM | POA: Diagnosis not present

## 2018-01-06 DIAGNOSIS — J45909 Unspecified asthma, uncomplicated: Secondary | ICD-10-CM | POA: Insufficient documentation

## 2018-01-06 DIAGNOSIS — F411 Generalized anxiety disorder: Secondary | ICD-10-CM | POA: Insufficient documentation

## 2018-01-06 DIAGNOSIS — K810 Acute cholecystitis: Secondary | ICD-10-CM

## 2018-01-06 HISTORY — DX: Calculus of gallbladder without cholecystitis without obstruction: K80.20

## 2018-01-06 MED ORDER — FENTANYL CITRATE (PF) 100 MCG/2ML IJ SOLN
50.0000 ug | INTRAMUSCULAR | Status: DC | PRN
  Administered 2018-01-06: 50 ug via INTRAVENOUS
  Filled 2018-01-06: qty 2

## 2018-01-06 MED ORDER — ONDANSETRON HCL 4 MG/2ML IJ SOLN
4.0000 mg | Freq: Once | INTRAMUSCULAR | Status: AC | PRN
  Administered 2018-01-06: 4 mg via INTRAVENOUS
  Filled 2018-01-06: qty 2

## 2018-01-06 NOTE — ED Triage Notes (Signed)
Reports having RUQ abdominal pain that radiates into the back.  Was seen on Tuesday but left due to long wait.  Told by family physician that she needed her gallbladder out.  Taking zofran for nausea.

## 2018-01-07 ENCOUNTER — Other Ambulatory Visit: Payer: Self-pay

## 2018-01-07 ENCOUNTER — Emergency Department (HOSPITAL_COMMUNITY): Payer: 59

## 2018-01-07 ENCOUNTER — Encounter (HOSPITAL_COMMUNITY): Payer: Self-pay | Admitting: General Practice

## 2018-01-07 DIAGNOSIS — K8012 Calculus of gallbladder with acute and chronic cholecystitis without obstruction: Secondary | ICD-10-CM | POA: Diagnosis not present

## 2018-01-07 DIAGNOSIS — G4733 Obstructive sleep apnea (adult) (pediatric): Secondary | ICD-10-CM | POA: Diagnosis not present

## 2018-01-07 DIAGNOSIS — K8020 Calculus of gallbladder without cholecystitis without obstruction: Secondary | ICD-10-CM | POA: Diagnosis present

## 2018-01-07 DIAGNOSIS — K8050 Calculus of bile duct without cholangitis or cholecystitis without obstruction: Secondary | ICD-10-CM | POA: Diagnosis not present

## 2018-01-07 DIAGNOSIS — J45909 Unspecified asthma, uncomplicated: Secondary | ICD-10-CM | POA: Diagnosis not present

## 2018-01-07 DIAGNOSIS — G2581 Restless legs syndrome: Secondary | ICD-10-CM | POA: Diagnosis not present

## 2018-01-07 DIAGNOSIS — K8066 Calculus of gallbladder and bile duct with acute and chronic cholecystitis without obstruction: Secondary | ICD-10-CM | POA: Diagnosis not present

## 2018-01-07 DIAGNOSIS — Z885 Allergy status to narcotic agent status: Secondary | ICD-10-CM | POA: Diagnosis not present

## 2018-01-07 DIAGNOSIS — F329 Major depressive disorder, single episode, unspecified: Secondary | ICD-10-CM | POA: Diagnosis not present

## 2018-01-07 DIAGNOSIS — F411 Generalized anxiety disorder: Secondary | ICD-10-CM | POA: Diagnosis not present

## 2018-01-07 DIAGNOSIS — Z79899 Other long term (current) drug therapy: Secondary | ICD-10-CM | POA: Diagnosis not present

## 2018-01-07 LAB — COMPREHENSIVE METABOLIC PANEL
ALT: 14 U/L (ref 14–54)
AST: 22 U/L (ref 15–41)
Albumin: 3.9 g/dL (ref 3.5–5.0)
Alkaline Phosphatase: 58 U/L (ref 38–126)
Anion gap: 10 (ref 5–15)
BUN: 19 mg/dL (ref 6–20)
CO2: 22 mmol/L (ref 22–32)
Calcium: 9.7 mg/dL (ref 8.9–10.3)
Chloride: 104 mmol/L (ref 101–111)
Creatinine, Ser: 0.71 mg/dL (ref 0.44–1.00)
GFR calc Af Amer: >60 mL/min (ref >60)
GFR calc non Af Amer: >60 mL/min (ref >60)
Glucose, Bld: 100 mg/dL — ABNORMAL HIGH (ref 65–99)
Potassium: 4.1 mmol/L (ref 3.5–5.1)
Sodium: 136 mmol/L (ref 135–145)
Total Bilirubin: 0.7 mg/dL (ref 0.3–1.2)
Total Protein: 7.5 g/dL (ref 6.5–8.1)

## 2018-01-07 LAB — CBC
HCT: 37.8 % (ref 36.0–46.0)
Hemoglobin: 12.5 g/dL (ref 12.0–15.0)
MCH: 31.1 pg (ref 26.0–34.0)
MCHC: 33.1 g/dL (ref 30.0–36.0)
MCV: 94 fL (ref 78.0–100.0)
Platelets: 265 10*3/uL (ref 150–400)
RBC: 4.02 MIL/uL (ref 3.87–5.11)
RDW: 13.6 % (ref 11.5–15.5)
WBC: 5.8 10*3/uL (ref 4.0–10.5)

## 2018-01-07 LAB — I-STAT BETA HCG BLOOD, ED (MC, WL, AP ONLY): I-stat hCG, quantitative: <5 m[IU]/mL (ref <5)

## 2018-01-07 LAB — URINALYSIS, ROUTINE W REFLEX MICROSCOPIC
Bilirubin Urine: NEGATIVE
Glucose, UA: NEGATIVE mg/dL
Hgb urine dipstick: NEGATIVE
Ketones, ur: NEGATIVE mg/dL
Leukocytes, UA: NEGATIVE
Nitrite: NEGATIVE
Protein, ur: NEGATIVE mg/dL
Specific Gravity, Urine: 1.024 (ref 1.005–1.030)
pH: 6 (ref 5.0–8.0)

## 2018-01-07 LAB — SURGICAL PCR SCREEN
MRSA, PCR: NEGATIVE
Staphylococcus aureus: POSITIVE — AB

## 2018-01-07 LAB — LIPASE, BLOOD: Lipase: 35 U/L (ref 11–51)

## 2018-01-07 MED ORDER — PROMETHAZINE HCL 25 MG PO TABS
50.0000 mg | ORAL_TABLET | Freq: Four times a day (QID) | ORAL | Status: DC | PRN
Start: 2018-01-07 — End: 2018-01-10

## 2018-01-07 MED ORDER — ACETAMINOPHEN 500 MG PO TABS
1000.0000 mg | ORAL_TABLET | Freq: Four times a day (QID) | ORAL | Status: DC
  Administered 2018-01-07: 1000 mg via ORAL
  Filled 2018-01-07: qty 2

## 2018-01-07 MED ORDER — QUETIAPINE FUMARATE 100 MG PO TABS
100.0000 mg | ORAL_TABLET | Freq: Every day | ORAL | Status: DC
  Administered 2018-01-07 – 2018-01-08 (×2): 100 mg via ORAL
  Filled 2018-01-07 (×2): qty 1

## 2018-01-07 MED ORDER — DOCUSATE SODIUM 100 MG PO CAPS
100.0000 mg | ORAL_CAPSULE | Freq: Two times a day (BID) | ORAL | Status: DC
  Administered 2018-01-09: 100 mg via ORAL
  Filled 2018-01-07: qty 1

## 2018-01-07 MED ORDER — FENTANYL CITRATE (PF) 100 MCG/2ML IJ SOLN
50.0000 ug | Freq: Once | INTRAMUSCULAR | Status: AC
  Administered 2018-01-07: 50 ug via INTRAVENOUS
  Filled 2018-01-07: qty 2

## 2018-01-07 MED ORDER — OXYCODONE HCL 5 MG PO TABS
5.0000 mg | ORAL_TABLET | ORAL | Status: DC | PRN
  Administered 2018-01-08: 10 mg via ORAL
  Administered 2018-01-08: 5 mg via ORAL
  Administered 2018-01-09 (×2): 10 mg via ORAL
  Filled 2018-01-07 (×2): qty 2
  Filled 2018-01-07: qty 1
  Filled 2018-01-07 (×2): qty 2

## 2018-01-07 MED ORDER — ONDANSETRON HCL 4 MG/2ML IJ SOLN
4.0000 mg | Freq: Four times a day (QID) | INTRAMUSCULAR | Status: DC | PRN
  Administered 2018-01-07 – 2018-01-08 (×3): 4 mg via INTRAVENOUS
  Filled 2018-01-07 (×2): qty 2

## 2018-01-07 MED ORDER — SODIUM CHLORIDE 0.9 % IV SOLN
INTRAVENOUS | Status: DC
  Administered 2018-01-07 – 2018-01-09 (×2): via INTRAVENOUS

## 2018-01-07 MED ORDER — ALBUTEROL SULFATE (2.5 MG/3ML) 0.083% IN NEBU: 2.5000 mg | INHALATION_SOLUTION | RESPIRATORY_TRACT | Status: DC | PRN

## 2018-01-07 MED ORDER — METOPROLOL TARTRATE 5 MG/5ML IV SOLN: 5.0000 mg | Freq: Four times a day (QID) | INTRAVENOUS | Status: DC | PRN

## 2018-01-07 MED ORDER — PROMETHAZINE HCL 25 MG/ML IJ SOLN
25.0000 mg | Freq: Once | INTRAMUSCULAR | Status: AC
  Administered 2018-01-07: 25 mg via INTRAVENOUS
  Filled 2018-01-07: qty 1

## 2018-01-07 MED ORDER — LEVETIRACETAM 750 MG PO TABS
1500.0000 mg | ORAL_TABLET | Freq: Two times a day (BID) | ORAL | Status: DC
  Administered 2018-01-07 – 2018-01-09 (×4): 1500 mg via ORAL
  Filled 2018-01-07 (×3): qty 2
  Filled 2018-01-07: qty 3

## 2018-01-07 MED ORDER — SODIUM CHLORIDE 0.9 % IV BOLUS
1000.0000 mL | Freq: Once | INTRAVENOUS | Status: AC
  Administered 2018-01-07: 1000 mL via INTRAVENOUS

## 2018-01-07 MED ORDER — SODIUM CHLORIDE 0.9 % IV SOLN
2.0000 g | INTRAVENOUS | Status: DC
  Administered 2018-01-07 – 2018-01-09 (×3): 2 g via INTRAVENOUS
  Filled 2018-01-07 (×3): qty 20

## 2018-01-07 MED ORDER — KETOROLAC TROMETHAMINE 30 MG/ML IJ SOLN: 30.0000 mg | Freq: Four times a day (QID) | INTRAMUSCULAR | Status: DC | PRN

## 2018-01-07 MED ORDER — ONDANSETRON 4 MG PO TBDP: 4.0000 mg | ORAL_TABLET | Freq: Four times a day (QID) | ORAL | Status: DC | PRN

## 2018-01-07 MED ORDER — FENTANYL CITRATE (PF) 100 MCG/2ML IJ SOLN: 25.0000 ug | INTRAMUSCULAR | Status: DC | PRN

## 2018-01-07 MED ORDER — ACETAMINOPHEN 500 MG PO TABS
1000.0000 mg | ORAL_TABLET | Freq: Four times a day (QID) | ORAL | Status: DC
  Administered 2018-01-07 – 2018-01-09 (×7): 1000 mg via ORAL
  Filled 2018-01-07 (×7): qty 2

## 2018-01-07 MED ORDER — SUMATRIPTAN SUCCINATE 6 MG/0.5ML ~~LOC~~ SOLN
6.0000 mg | SUBCUTANEOUS | Status: DC | PRN
  Filled 2018-01-07: qty 0.5

## 2018-01-07 MED ORDER — SERTRALINE HCL 100 MG PO TABS
200.0000 mg | ORAL_TABLET | Freq: Every day | ORAL | Status: DC
  Administered 2018-01-07 – 2018-01-08 (×2): 200 mg via ORAL
  Filled 2018-01-07 (×2): qty 2

## 2018-01-07 NOTE — Progress Notes (Signed)
1454 received pt from ED. Pt states minimal pain, A&O x4.
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
## 2018-01-07 NOTE — ED Notes (Signed)
Patient transported to Ultrasound 

## 2018-01-08 ENCOUNTER — Encounter (HOSPITAL_COMMUNITY)
Admission: EM | Disposition: A | Discharge status: Home / Self Care | Payer: Self-pay | Source: Home / Self Care | Attending: Emergency Medicine

## 2018-01-08 ENCOUNTER — Observation Stay (HOSPITAL_COMMUNITY): Payer: 59 | Admitting: Anesthesiology

## 2018-01-08 ENCOUNTER — Observation Stay (HOSPITAL_COMMUNITY): Payer: 59

## 2018-01-08 ENCOUNTER — Encounter (HOSPITAL_COMMUNITY): Payer: Self-pay | Admitting: Certified Registered Nurse Anesthetist

## 2018-01-08 DIAGNOSIS — G4733 Obstructive sleep apnea (adult) (pediatric): Secondary | ICD-10-CM | POA: Diagnosis not present

## 2018-01-08 DIAGNOSIS — K831 Obstruction of bile duct: Secondary | ICD-10-CM | POA: Diagnosis not present

## 2018-01-08 DIAGNOSIS — K810 Acute cholecystitis: Secondary | ICD-10-CM | POA: Diagnosis not present

## 2018-01-08 DIAGNOSIS — Z885 Allergy status to narcotic agent status: Secondary | ICD-10-CM | POA: Diagnosis not present

## 2018-01-08 DIAGNOSIS — J45909 Unspecified asthma, uncomplicated: Secondary | ICD-10-CM | POA: Diagnosis not present

## 2018-01-08 DIAGNOSIS — Z79899 Other long term (current) drug therapy: Secondary | ICD-10-CM | POA: Diagnosis not present

## 2018-01-08 DIAGNOSIS — R932 Abnormal findings on diagnostic imaging of liver and biliary tract: Secondary | ICD-10-CM | POA: Diagnosis not present

## 2018-01-08 DIAGNOSIS — G2581 Restless legs syndrome: Secondary | ICD-10-CM | POA: Diagnosis not present

## 2018-01-08 DIAGNOSIS — K8020 Calculus of gallbladder without cholecystitis without obstruction: Secondary | ICD-10-CM | POA: Diagnosis not present

## 2018-01-08 DIAGNOSIS — K8066 Calculus of gallbladder and bile duct with acute and chronic cholecystitis without obstruction: Secondary | ICD-10-CM | POA: Diagnosis not present

## 2018-01-08 DIAGNOSIS — K8050 Calculus of bile duct without cholangitis or cholecystitis without obstruction: Secondary | ICD-10-CM | POA: Diagnosis not present

## 2018-01-08 DIAGNOSIS — F329 Major depressive disorder, single episode, unspecified: Secondary | ICD-10-CM | POA: Diagnosis not present

## 2018-01-08 DIAGNOSIS — K8012 Calculus of gallbladder with acute and chronic cholecystitis without obstruction: Secondary | ICD-10-CM | POA: Diagnosis not present

## 2018-01-08 DIAGNOSIS — J309 Allergic rhinitis, unspecified: Secondary | ICD-10-CM | POA: Diagnosis not present

## 2018-01-08 DIAGNOSIS — F411 Generalized anxiety disorder: Secondary | ICD-10-CM | POA: Diagnosis not present

## 2018-01-08 HISTORY — PX: CHOLECYSTECTOMY: SHX55

## 2018-01-08 LAB — COMPREHENSIVE METABOLIC PANEL
ALT: 12 U/L — ABNORMAL LOW (ref 14–54)
AST: 14 U/L — ABNORMAL LOW (ref 15–41)
Albumin: 3.2 g/dL — ABNORMAL LOW (ref 3.5–5.0)
Alkaline Phosphatase: 52 U/L (ref 38–126)
Anion gap: 5 (ref 5–15)
BUN: 12 mg/dL (ref 6–20)
CO2: 25 mmol/L (ref 22–32)
Calcium: 8.3 mg/dL — ABNORMAL LOW (ref 8.9–10.3)
Chloride: 111 mmol/L (ref 101–111)
Creatinine, Ser: 0.69 mg/dL (ref 0.44–1.00)
GFR calc Af Amer: >60 mL/min (ref >60)
GFR calc non Af Amer: >60 mL/min (ref >60)
Glucose, Bld: 89 mg/dL (ref 65–99)
Potassium: 3.9 mmol/L (ref 3.5–5.1)
Sodium: 141 mmol/L (ref 135–145)
Total Bilirubin: 0.5 mg/dL (ref 0.3–1.2)
Total Protein: 6.1 g/dL — ABNORMAL LOW (ref 6.5–8.1)

## 2018-01-08 LAB — HIV ANTIBODY (ROUTINE TESTING W REFLEX): HIV Screen 4th Generation wRfx: NONREACTIVE

## 2018-01-08 LAB — CBC
HCT: 32.3 % — ABNORMAL LOW (ref 36.0–46.0)
Hemoglobin: 10.6 g/dL — ABNORMAL LOW (ref 12.0–15.0)
MCH: 31.4 pg (ref 26.0–34.0)
MCHC: 32.8 g/dL (ref 30.0–36.0)
MCV: 95.6 fL (ref 78.0–100.0)
Platelets: 186 10*3/uL (ref 150–400)
RBC: 3.38 MIL/uL — ABNORMAL LOW (ref 3.87–5.11)
RDW: 13.9 % (ref 11.5–15.5)
WBC: 3.8 10*3/uL — ABNORMAL LOW (ref 4.0–10.5)

## 2018-01-08 SURGERY — LAPAROSCOPIC CHOLECYSTECTOMY WITH INTRAOPERATIVE CHOLANGIOGRAM
Anesthesia: General | Site: Abdomen

## 2018-01-08 MED ORDER — BUPIVACAINE-EPINEPHRINE 0.25% -1:200000 IJ SOLN
INTRAMUSCULAR | Status: DC | PRN
  Administered 2018-01-08: 16 mL

## 2018-01-08 MED ORDER — LACTATED RINGERS IV SOLN
INTRAVENOUS | Status: DC
  Administered 2018-01-08: 08:00:00 via INTRAVENOUS

## 2018-01-08 MED ORDER — IOPAMIDOL (ISOVUE-300) INJECTION 61%
INTRAVENOUS | Status: AC
Start: 2018-01-08 — End: 2018-01-08
  Filled 2018-01-08: qty 50

## 2018-01-08 MED ORDER — MUPIROCIN 2 % EX OINT
1.0000 "application " | TOPICAL_OINTMENT | Freq: Two times a day (BID) | CUTANEOUS | Status: DC
  Administered 2018-01-08 – 2018-01-09 (×3): 1 via NASAL
  Filled 2018-01-08: qty 22

## 2018-01-08 MED ORDER — FENTANYL CITRATE (PF) 250 MCG/5ML IJ SOLN
INTRAMUSCULAR | Status: AC
  Filled 2018-01-08: qty 5

## 2018-01-08 MED ORDER — MIDAZOLAM HCL 2 MG/2ML IJ SOLN
INTRAMUSCULAR | Status: DC | PRN
  Administered 2018-01-08: 2 mg via INTRAVENOUS

## 2018-01-08 MED ORDER — FENTANYL CITRATE (PF) 250 MCG/5ML IJ SOLN
INTRAMUSCULAR | Status: DC | PRN
  Administered 2018-01-08: 100 ug via INTRAVENOUS
  Administered 2018-01-08 (×4): 50 ug via INTRAVENOUS

## 2018-01-08 MED ORDER — GLUCAGON HCL RDNA (DIAGNOSTIC) 1 MG IJ SOLR
INTRAMUSCULAR | Status: DC | PRN
  Administered 2018-01-08: 1 mg via INTRAVENOUS

## 2018-01-08 MED ORDER — 0.9 % SODIUM CHLORIDE (POUR BTL) OPTIME
TOPICAL | Status: DC | PRN
  Administered 2018-01-08: 1000 mL

## 2018-01-08 MED ORDER — FENTANYL CITRATE (PF) 100 MCG/2ML IJ SOLN
INTRAMUSCULAR | Status: AC
  Administered 2018-01-08: 50 ug via INTRAVENOUS
  Filled 2018-01-08: qty 2

## 2018-01-08 MED ORDER — SODIUM CHLORIDE 0.9 % IV SOLN
INTRAVENOUS | Status: DC | PRN
  Administered 2018-01-08: 25 mL

## 2018-01-08 MED ORDER — PROPOFOL 10 MG/ML IV BOLUS
INTRAVENOUS | Status: DC | PRN
  Administered 2018-01-08: 150 mg via INTRAVENOUS
  Administered 2018-01-08: 50 mg via INTRAVENOUS

## 2018-01-08 MED ORDER — DEXAMETHASONE SODIUM PHOSPHATE 10 MG/ML IJ SOLN
INTRAMUSCULAR | Status: DC | PRN
  Administered 2018-01-08: 10 mg via INTRAVENOUS

## 2018-01-08 MED ORDER — GLUCAGON HCL RDNA (DIAGNOSTIC) 1 MG IJ SOLR
INTRAMUSCULAR | Status: AC
  Filled 2018-01-08: qty 1

## 2018-01-08 MED ORDER — ROCURONIUM BROMIDE 10 MG/ML (PF) SYRINGE
PREFILLED_SYRINGE | INTRAVENOUS | Status: DC | PRN
  Administered 2018-01-08: 50 mg via INTRAVENOUS

## 2018-01-08 MED ORDER — ONDANSETRON HCL 4 MG/2ML IJ SOLN: INTRAMUSCULAR | Status: DC | PRN

## 2018-01-08 MED ORDER — PROPOFOL 10 MG/ML IV BOLUS
INTRAVENOUS | Status: AC
  Filled 2018-01-08: qty 20

## 2018-01-08 MED ORDER — SODIUM CHLORIDE 0.9 % IR SOLN
Status: DC | PRN
  Administered 2018-01-08: 1

## 2018-01-08 MED ORDER — BUPIVACAINE-EPINEPHRINE 0.25% -1:200000 IJ SOLN
INTRAMUSCULAR | Status: AC
  Filled 2018-01-08: qty 1

## 2018-01-08 MED ORDER — SUGAMMADEX SODIUM 200 MG/2ML IV SOLN
INTRAVENOUS | Status: DC | PRN
  Administered 2018-01-08: 200 mg via INTRAVENOUS

## 2018-01-08 MED ORDER — SUCCINYLCHOLINE CHLORIDE 20 MG/ML IJ SOLN
INTRAMUSCULAR | Status: DC | PRN
  Administered 2018-01-08: 120 mg via INTRAVENOUS

## 2018-01-08 MED ORDER — LIDOCAINE 2% (20 MG/ML) 5 ML SYRINGE
INTRAMUSCULAR | Status: DC | PRN
  Administered 2018-01-08: 80 mg via INTRAVENOUS

## 2018-01-08 MED ORDER — FENTANYL CITRATE (PF) 100 MCG/2ML IJ SOLN
25.0000 ug | INTRAMUSCULAR | Status: DC | PRN
  Administered 2018-01-08 (×2): 50 ug via INTRAVENOUS

## 2018-01-08 MED ORDER — SCOPOLAMINE 1 MG/3DAYS TD PT72
1.0000 | MEDICATED_PATCH | TRANSDERMAL | Status: DC
Start: 2018-01-08 — End: 2018-01-10
  Administered 2018-01-08: 1.5 mg via TRANSDERMAL
  Filled 2018-01-08: qty 1

## 2018-01-08 MED ORDER — ONDANSETRON HCL 4 MG/2ML IJ SOLN: 4.0000 mg | Freq: Once | INTRAMUSCULAR | Status: DC | PRN

## 2018-01-08 MED ORDER — EPHEDRINE SULFATE 50 MG/ML IJ SOLN
INTRAMUSCULAR | Status: DC | PRN
  Administered 2018-01-08 (×2): 10 mg via INTRAVENOUS

## 2018-01-08 MED ORDER — MIDAZOLAM HCL 2 MG/2ML IJ SOLN
INTRAMUSCULAR | Status: AC
  Filled 2018-01-08: qty 2

## 2018-01-08 MED ORDER — CHLORHEXIDINE GLUCONATE CLOTH 2 % EX PADS
6.0000 | MEDICATED_PAD | Freq: Every day | CUTANEOUS | Status: DC
  Administered 2018-01-08: 6 via TOPICAL

## 2018-01-08 SURGICAL SUPPLY — 50 items
APL SKNCLS STERI-STRIP NONHPOA (GAUZE/BANDAGES/DRESSINGS) ×1
APPLIER CLIP ROT 10 11.4 M/L (STAPLE) ×2
APR CLP MED LRG 11.4X10 (STAPLE) ×1
BAG SPEC RTRVL 10 TROC 200 (ENDOMECHANICALS)
BAG SPEC RTRVL LRG 6X4 10 (ENDOMECHANICALS)
BENZOIN TINCTURE PRP APPL 2/3 (GAUZE/BANDAGES/DRESSINGS) ×2 IMPLANT
BLADE CLIPPER SURG (BLADE) IMPLANT
CANISTER SUCT 3000ML PPV (MISCELLANEOUS) ×2 IMPLANT
CHLORAPREP W/TINT 26ML (MISCELLANEOUS) ×2 IMPLANT
CLIP APPLIE ROT 10 11.4 M/L (STAPLE) ×1 IMPLANT
COVER MAYO STAND STRL (DRAPES) ×2 IMPLANT
COVER SURGICAL LIGHT HANDLE (MISCELLANEOUS) ×2 IMPLANT
DRAPE C-ARM 42X72 X-RAY (DRAPES) ×2 IMPLANT
DRSG TEGADERM 2-3/8X2-3/4 SM (GAUZE/BANDAGES/DRESSINGS) ×6 IMPLANT
DRSG TEGADERM 4X4.75 (GAUZE/BANDAGES/DRESSINGS) ×2 IMPLANT
ELECT REM PT RETURN 9FT ADLT (ELECTROSURGICAL) ×2
ELECTRODE REM PT RTRN 9FT ADLT (ELECTROSURGICAL) ×1 IMPLANT
FILTER SMOKE EVAC LAPAROSHD (FILTER) ×2 IMPLANT
GAUZE SPONGE 2X2 8PLY STRL LF (GAUZE/BANDAGES/DRESSINGS) ×1 IMPLANT
GLOVE BIO SURGEON STRL SZ7 (GLOVE) ×2 IMPLANT
GLOVE BIOGEL PI IND STRL 7.5 (GLOVE) ×1 IMPLANT
GLOVE BIOGEL PI IND STRL 8 (GLOVE) ×1 IMPLANT
GLOVE BIOGEL PI INDICATOR 7.5 (GLOVE) ×1
GLOVE BIOGEL PI INDICATOR 8 (GLOVE) ×1
GLOVE SURG SS PI 8.0 STRL IVOR (GLOVE) ×2 IMPLANT
GOWN STRL REUS W/ TWL LRG LVL3 (GOWN DISPOSABLE) ×3 IMPLANT
GOWN STRL REUS W/TWL LRG LVL3 (GOWN DISPOSABLE) ×6
KIT BASIN OR (CUSTOM PROCEDURE TRAY) ×2 IMPLANT
KIT TURNOVER KIT B (KITS) ×2 IMPLANT
NS IRRIG 1000ML POUR BTL (IV SOLUTION) ×2 IMPLANT
PAD ARMBOARD 7.5X6 YLW CONV (MISCELLANEOUS) ×2 IMPLANT
POUCH RETRIEVAL ECOSAC 10 (ENDOMECHANICALS) IMPLANT
POUCH RETRIEVAL ECOSAC 10MM (ENDOMECHANICALS)
POUCH SPECIMEN RETRIEVAL 10MM (ENDOMECHANICALS) IMPLANT
SCISSORS LAP 5X35 DISP (ENDOMECHANICALS) ×2 IMPLANT
SET CHOLANGIOGRAPH 5 50 .035 (SET/KITS/TRAYS/PACK) ×2 IMPLANT
SET IRRIG TUBING LAPAROSCOPIC (IRRIGATION / IRRIGATOR) ×2 IMPLANT
SLEEVE ENDOPATH XCEL 5M (ENDOMECHANICALS) ×2 IMPLANT
SPECIMEN JAR SMALL (MISCELLANEOUS) ×2 IMPLANT
SPONGE GAUZE 2X2 STER 10/PKG (GAUZE/BANDAGES/DRESSINGS) ×1
STRIP CLOSURE SKIN 1/2X4 (GAUZE/BANDAGES/DRESSINGS) ×2 IMPLANT
SUT MNCRL AB 4-0 PS2 18 (SUTURE) ×2 IMPLANT
TOWEL OR 17X24 6PK STRL BLUE (TOWEL DISPOSABLE) ×2 IMPLANT
TOWEL OR 17X26 10 PK STRL BLUE (TOWEL DISPOSABLE) ×2 IMPLANT
TRAY LAPAROSCOPIC MC (CUSTOM PROCEDURE TRAY) ×2 IMPLANT
TROCAR XCEL BLUNT TIP 100MML (ENDOMECHANICALS) ×2 IMPLANT
TROCAR XCEL NON-BLD 11X100MML (ENDOMECHANICALS) ×2 IMPLANT
TROCAR XCEL NON-BLD 5MMX100MML (ENDOMECHANICALS) ×2 IMPLANT
TUBING INSUFFLATION (TUBING) ×2 IMPLANT
WATER STERILE IRR 1000ML POUR (IV SOLUTION) ×2 IMPLANT

## 2018-01-08 NOTE — Progress Notes (Signed)
Pt to short stay. NPO at [facility], report given to Cecilia.

## 2018-01-08 NOTE — Anesthesia Postprocedure Evaluation (Signed)
Anesthesia Post Note  Patient: Kathryn [name]  Procedure(s) Performed: LAPAROSCOPIC CHOLECYSTECTOMY WITH INTRAOPERATIVE CHOLANGIOGRAM (N/A Abdomen)     Patient location during evaluation: PACU Anesthesia Type: General Level of consciousness: awake and alert Pain management: pain level controlled Vital Signs Assessment: post-procedure vital signs reviewed and stable Respiratory status: spontaneous breathing, nonlabored ventilation, respiratory function stable and patient connected to nasal cannula oxygen Cardiovascular status: blood pressure returned to baseline and stable Postop Assessment: no apparent nausea or vomiting Anesthetic complications: no    Last Vitals:  Vitals:   01/08/18 1030 01/08/18 1048  BP:  107/72  Pulse:  76  Resp:    Temp: 36.6 C 36.8 C  SpO2:  97%    Last Pain:  Vitals:   01/08/18 1048  TempSrc: Oral  PainSc:                  Dell Hurtubise COKER
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
## 2018-01-08 NOTE — Plan of Care (Addendum)
Patient ambulates frequently in room and is compliant with treatment offered.

## 2018-01-08 NOTE — Progress Notes (Signed)
Day of Surgery   Subjective/Chief Complaint: Patient still with some RUQ pain Controlled with IV pain meds   Objective: Vital signs in last 24 hours: Temp:  [97.7 F (36.5 C)-98.6 F (37 C)] 97.7 F (36.5 C) (04/30 0548) Pulse Rate:  [58-76] 60 (04/30 0548) Resp:  [16] 16 (04/30 0548) BP: (86-130)/(60-81) 98/65 (04/30 0548) SpO2:  [96 %-100 %] 100 % (04/30 0548) Weight:  [82.2 kg (181 lb 3.5 oz)] 82.2 kg (181 lb 3.5 oz) (04/29 1449) Last BM Date: 01/05/18  Intake/Output from previous day: 04/29 0701 - 04/30 0700 In: 2465 [P.O.:440; I.V.:825; IV Piggyback:1200] Out: -  Intake/Output this shift: No intake/output data recorded.  General appearance: alert, cooperative and no distress GI: soft, mild RUQ tenderness  Lab Results:  Recent Labs    01/06/18 2340 01/08/18 0332  WBC 5.8 3.8*  HGB 12.5 10.6*  HCT 37.8 32.3*  PLT 265 186   BMET Recent Labs    01/06/18 2340 01/08/18 0332  NA 136 141  K 4.1 3.9  CL 104 111  CO2 22 25  GLUCOSE 100* 89  BUN 19 12  CREATININE 0.71 0.69  CALCIUM 9.7 8.3*   PT/INR No results for input(s): LABPROT, INR in the last 72 hours. ABG No results for input(s): PHART, HCO3 in the last 72 hours.  Invalid input(s): PCO2, PO2  Studies/Results: US Abdomen Limited Ruq  Addendum Date: 01/07/2018   ADDENDUM REPORT: 01/07/2018 12:24 ADDENDUM: These results were called by telephone at the time of interpretation on 01/07/2018 at 12:13 pm to Dr. Marda Stalker , who verbally acknowledged these results. Electronically Signed   By: Genia Del M.D.   On: 01/07/2018 12:24   Result Date: 01/07/2018 CLINICAL DATA:  57 year old female with right upper quadrant pain. Subsequent encounter. EXAM: ULTRASOUND ABDOMEN LIMITED RIGHT UPPER QUADRANT COMPARISON:  01/02/2018 CT. FINDINGS: Gallbladder: Several mobile gallstones measuring up to 9 mm. At points there is gallbladder wall thickening measuring up to 3.1 mm. Patient was tender over the  gallbladder during scanning per sonographer. Common bile duct: Diameter: 5.5 mm Liver: No focal lesion identified. Within normal limits in parenchymal echogenicity. Portal vein is patent on color Doppler imaging with normal direction of blood flow towards the liver. IMPRESSION: Gallstones measuring up to 9 mm. Mild gallbladder wall thickening. Patient tender over the gallbladder during scanning. Findings raise possibility of acute cholecystitis. These results will be called to the ordering clinician or representative by the Radiologist Assistant, and communication documented in the PACS or zVision Dashboard. Electronically Signed: By: Genia Del M.D. On: 01/07/2018 12:06    Anti-infectives: Anti-infectives (From admission, onward)   Start     Dose/Rate Route Frequency Ordered Stop   01/07/18 1400  [MAR Hold]  cefTRIAXone (ROCEPHIN) 2 g in sodium chloride 0.9 % 100 mL IVPB     (MAR Hold since Tue 01/08/2018 at 0813. Reason: Transfer to a Procedural area.)   2 g 200 mL/hr over 30 Minutes Intravenous Every 24 hours 01/07/18 1257        Assessment/Plan: Acute cholecystitis   Plan laparoscopic cholecystectomy with intraoperative cholangiogram today.  The surgical procedure has been discussed with the patient.  Potential risks, benefits, alternative treatments, and expected outcomes have been explained.  All of the patient's questions at this time have been answered.  The likelihood of reaching the patient's treatment goal is good.  The patient understand the proposed surgical procedure and wishes to proceed.   LOS: 0 days    Kathryn [name]  Kathryn [name] 01/08/2018

## 2018-01-08 NOTE — Progress Notes (Signed)
Received from PACU, dressings clean, dry, intact. Pt states pain 4/10 with nausea. A&O x4.

## 2018-01-08 NOTE — Transfer of Care (Signed)
Immediate Anesthesia Transfer of Care Note  Patient: Kathryn [name]  Procedure(s) Performed: LAPAROSCOPIC CHOLECYSTECTOMY WITH INTRAOPERATIVE CHOLANGIOGRAM (N/A Abdomen)  Patient Location: PACU  Anesthesia Type:General  Level of Consciousness: awake, alert , oriented and patient cooperative  Airway & Oxygen Therapy: Patient Spontanous Breathing  Post-op Assessment: Report given to RN and Post -op Vital signs reviewed and stable  Post vital signs: Reviewed and stable  Last Vitals:  Vitals Value Taken Time  BP 117/70 01/08/2018  9:59 AM  Temp    Pulse 89 01/08/2018 10:00 AM  Resp 23 01/08/2018 10:00 AM  SpO2 96 % 01/08/2018 10:00 AM  Vitals shown include unvalidated device data.  Last Pain:  Vitals:   01/08/18 0732  TempSrc:   PainSc: 1       Patients Stated Pain Goal: 1 (22/57/50 5183)  Complications: No apparent anesthesia complications

## 2018-01-08 NOTE — Anesthesia Preprocedure Evaluation (Signed)
Anesthesia Evaluation  Patient identified by MRN, date of birth, ID band Patient awake    Reviewed: Allergy & Precautions, NPO status , Patient's Chart, lab work & pertinent test results  Airway Mallampati: III  TM Distance: >3 FB Neck ROM: Full    Dental  (+) Teeth Intact, Dental Advisory Given   Pulmonary    breath sounds clear to auscultation       Cardiovascular  Rhythm:Regular Rate:Normal     Neuro/Psych    GI/Hepatic   Endo/Other    Renal/GU      Musculoskeletal   Abdominal   Peds  Hematology   Anesthesia Other Findings   Reproductive/Obstetrics                             Anesthesia Physical Anesthesia Plan  ASA: II  Anesthesia Plan: General   Post-op Pain Management:    Induction: Intravenous, Rapid sequence and Cricoid pressure planned  PONV Risk Score and Plan: Ondansetron and Dexamethasone  Airway Management Planned: Oral ETT  Additional Equipment:   Intra-op Plan:   Post-operative Plan: Extubation in OR  Informed Consent: I have reviewed the patients History and Physical, chart, labs and discussed the procedure including the risks, benefits and alternatives for the proposed anesthesia with the patient or authorized representative who has indicated his/her understanding and acceptance.   Dental advisory given  Plan Discussed with: CRNA and Anesthesiologist  Anesthesia Plan Comments:         Anesthesia Quick Evaluation

## 2018-01-08 NOTE — Anesthesia Procedure Notes (Addendum)
Procedure Name: Intubation Date/Time: 01/08/2018 8:45 AM Performed by: White, Amedeo Plenty, CRNA Pre-anesthesia Checklist: Patient identified, Emergency Drugs available, Suction available and Patient being monitored Patient Re-evaluated:Patient Re-evaluated prior to induction Oxygen Delivery Method: Circle System Utilized Preoxygenation: Pre-oxygenation with 100% oxygen Induction Type: Rapid sequence and Cricoid Pressure applied Ventilation: Mask ventilation without difficulty and Oral airway inserted - appropriate to patient size Laryngoscope Size: Glidescope and 4 Grade View: Grade IV Tube type: Oral Tube size: 7.0 mm Number of attempts: 3 Airway Equipment and Method: Stylet and Oral airway Placement Confirmation: ETT inserted through vocal cords under direct vision,  positive ETCO2 and breath sounds checked- equal and bilateral Secured at: 22 cm Tube secured with: Tape Dental Injury: Teeth and Oropharynx as per pre-operative assessment  Difficulty Due To: Difficulty was unanticipated, Difficult Airway- due to anterior larynx and Difficult Airway- due to limited oral opening Future Recommendations: Recommend- induction with short-acting agent, and alternative techniques readily available Comments: DLX1 by CRNA with MAC 3 - grade IV view; DLx2 by MD with Sabra Heck 2 - grade III, unable to advance bougie; BMV with cricoid pressure; DL x3 by MD with Glidescope 4 - grade I view, 7.0 oral ett placed, ETT secured.

## 2018-01-08 NOTE — Plan of Care (Signed)
  Problem: Clinical Measurements: Goal: Postoperative complications will be avoided or minimized Outcome: Progressing   

## 2018-01-08 NOTE — Progress Notes (Signed)
Nose ring placed in denture cup and taken to PACU

## 2018-01-08 NOTE — Op Note (Signed)
Laparoscopic Cholecystectomy with IOC Procedure Note  Indications: Patient is a 57 year old female with PMH significant for migraines and epilepsy who presented to [facility] with worsening RUQ pain. Was initially seen 4/24 and found to have gallbladder wall thickening on CT but patient left AMA. Since that time abdominal pain has worsened. Pain is located in the RUQ, sharp in nature, does not radiate. She does not note anything that makes pain better outside of pain medication, does not note anything that makes pain worse. She does report experiencing symptoms like this in the past but less severe and it resolved without any treatment.  Ultrasound showed wall thickening and multiple gallstones.  LFT's were within normal limits    Pre-operative Diagnosis: Calculus of gallbladder with acute cholecystitis, without mention of obstruction  Post-operative Diagnosis: Calculus of gallbladder with other cholecystitis and obstruction  Surgeon: Maia Petties   Assistants: Jackson Latino PA-C  Anesthesia: General endotracheal anesthesia  ASA Class: 2  Procedure Details  The patient was seen again in the Holding Room. The risks, benefits, complications, treatment options, and expected outcomes were discussed with the patient. The possibilities of reaction to medication, pulmonary aspiration, perforation of viscus, bleeding, recurrent infection, finding a normal gallbladder, the need for additional procedures, failure to diagnose a condition, the possible need to convert to an open procedure, and creating a complication requiring transfusion or operation were discussed with the patient. The likelihood of improving the patient's symptoms with return to their baseline status is good.  The patient and/or family concurred with the proposed plan, giving informed consent. The site of surgery properly noted. The patient was taken to Operating Room, identified as Kathryn [name] and the procedure verified as  Laparoscopic Cholecystectomy with Intraoperative Cholangiogram. A Time Out was held and the above information confirmed.  Prior to the induction of general anesthesia, antibiotic prophylaxis was administered. General endotracheal anesthesia was then administered and tolerated well. After the induction, the abdomen was prepped with Chloraprep and draped in the sterile fashion. The patient was positioned in the supine position.  Local anesthetic agent was injected into the skin near the umbilicus and an incision made. We dissected down to the abdominal fascia with blunt dissection.  The fascia was incised vertically and we entered the peritoneal cavity bluntly.  A pursestring suture of 0-Vicryl was placed around the fascial opening.  The Hasson cannula was inserted and secured with the stay suture.  Pneumoperitoneum was then created with CO2 and tolerated well without any adverse changes in the patient's vital signs. An 11-mm port was placed in the subxiphoid position.  Two 5-mm ports were placed in the right upper quadrant. All skin incisions were infiltrated with a local anesthetic agent before making the incision and placing the trocars.   We positioned the patient in reverse Trendelenburg, tilted slightly to the patient's left.  The gallbladder was identified, the fundus grasped and retracted cephalad. The gallbladder is quite distended with mild wall thickening.  There are some omental adhesions.  Adhesions were lysed bluntly and with the electrocautery where indicated, taking care not to injure any adjacent organs or viscus. The infundibulum was grasped and retracted laterally, exposing the peritoneum overlying the triangle of Calot. This was then divided and exposed in a blunt fashion. A critical view of the cystic duct and cystic artery was obtained.  The cystic duct was clearly identified and bluntly dissected circumferentially. The cystic duct was ligated with a clip distally.   An incision was made in  the cystic duct and the [facility] cholangiogram catheter introduced. The catheter was secured using a clip. A cholangiogram was then obtained which showed good visualization of the distal and proximal biliary tree but there were multiple filling defects in the distal CBD.  Contrast flowed into the duodenum.  We administered glucagon, waited three minutes, then repeated the cholangiogram.  Some of the debris flushed into the duodenum, but there seemed to be two remaining filling defects lodged at the ampulla. The catheter was then removed.  We will ask GI to perform ERCP.  The cystic duct was then ligated with clips and divided. The cystic artery was identified, dissected free, ligated with clips and divided as well.   The gallbladder was dissected from the liver bed in retrograde fashion with the electrocautery. The gallbladder was removed and placed in an Endocatch sac. The liver bed was irrigated and inspected. Hemostasis was achieved with the electrocautery. Copious irrigation was utilized and was repeatedly aspirated until clear.  The gallbladder and Endocatch sac were then removed through the umbilical port site.  The pursestring suture was used to close the umbilical fascia.    We again inspected the right upper quadrant for hemostasis.  Pneumoperitoneum was released as we removed the trocars.  4-0 Monocryl was used to close the skin.   Benzoin, steri-strips, and clean dressings were applied. The patient was then extubated and brought to the recovery room in stable condition. Instrument, sponge, and needle counts were correct at closure and at the conclusion of the case.   Findings: Cholecystitis with Cholelithiasis and choledocholithiasis  Estimated Blood Loss: Minimal         Drains: none         Specimens: Gallbladder           Complications: None; patient tolerated the procedure well.         Disposition: PACU - hemodynamically stable.         Condition: stable  Imogene Burn. Georgette Dover, MD,  [facility]  General/ Trauma Surgery  01/08/2018 9:48 AM

## 2018-01-09 ENCOUNTER — Observation Stay (HOSPITAL_COMMUNITY): Payer: 59 | Admitting: Certified Registered Nurse Anesthetist

## 2018-01-09 ENCOUNTER — Encounter (HOSPITAL_COMMUNITY)
Admission: EM | Disposition: A | Discharge status: Home / Self Care | Payer: Self-pay | Source: Home / Self Care | Attending: Emergency Medicine

## 2018-01-09 ENCOUNTER — Observation Stay (HOSPITAL_COMMUNITY): Payer: 59

## 2018-01-09 ENCOUNTER — Encounter (HOSPITAL_COMMUNITY): Payer: Self-pay | Admitting: Surgery

## 2018-01-09 DIAGNOSIS — I959 Hypotension, unspecified: Secondary | ICD-10-CM | POA: Diagnosis not present

## 2018-01-09 DIAGNOSIS — F5104 Psychophysiologic insomnia: Secondary | ICD-10-CM | POA: Diagnosis not present

## 2018-01-09 DIAGNOSIS — J45909 Unspecified asthma, uncomplicated: Secondary | ICD-10-CM | POA: Diagnosis not present

## 2018-01-09 DIAGNOSIS — G2581 Restless legs syndrome: Secondary | ICD-10-CM | POA: Diagnosis not present

## 2018-01-09 DIAGNOSIS — Z79899 Other long term (current) drug therapy: Secondary | ICD-10-CM | POA: Diagnosis not present

## 2018-01-09 DIAGNOSIS — F329 Major depressive disorder, single episode, unspecified: Secondary | ICD-10-CM | POA: Diagnosis not present

## 2018-01-09 DIAGNOSIS — K8012 Calculus of gallbladder with acute and chronic cholecystitis without obstruction: Secondary | ICD-10-CM | POA: Diagnosis not present

## 2018-01-09 DIAGNOSIS — F411 Generalized anxiety disorder: Secondary | ICD-10-CM | POA: Diagnosis not present

## 2018-01-09 DIAGNOSIS — R932 Abnormal findings on diagnostic imaging of liver and biliary tract: Secondary | ICD-10-CM | POA: Diagnosis not present

## 2018-01-09 DIAGNOSIS — D649 Anemia, unspecified: Secondary | ICD-10-CM | POA: Diagnosis not present

## 2018-01-09 DIAGNOSIS — K831 Obstruction of bile duct: Secondary | ICD-10-CM | POA: Diagnosis not present

## 2018-01-09 DIAGNOSIS — K8050 Calculus of bile duct without cholangitis or cholecystitis without obstruction: Secondary | ICD-10-CM | POA: Diagnosis not present

## 2018-01-09 DIAGNOSIS — Z885 Allergy status to narcotic agent status: Secondary | ICD-10-CM | POA: Diagnosis not present

## 2018-01-09 DIAGNOSIS — G4733 Obstructive sleep apnea (adult) (pediatric): Secondary | ICD-10-CM | POA: Diagnosis not present

## 2018-01-09 HISTORY — PX: ERCP: SHX5425

## 2018-01-09 LAB — COMPREHENSIVE METABOLIC PANEL
ALT: 52 U/L (ref 14–54)
AST: 86 U/L — ABNORMAL HIGH (ref 15–41)
Albumin: 3.1 g/dL — ABNORMAL LOW (ref 3.5–5.0)
Alkaline Phosphatase: 77 U/L (ref 38–126)
Anion gap: 6 (ref 5–15)
BUN: 6 mg/dL (ref 6–20)
CO2: 24 mmol/L (ref 22–32)
Calcium: 8.2 mg/dL — ABNORMAL LOW (ref 8.9–10.3)
Chloride: 108 mmol/L (ref 101–111)
Creatinine, Ser: 0.67 mg/dL (ref 0.44–1.00)
GFR calc Af Amer: >60 mL/min (ref >60)
GFR calc non Af Amer: >60 mL/min (ref >60)
Glucose, Bld: 94 mg/dL (ref 65–99)
Potassium: 3.9 mmol/L (ref 3.5–5.1)
Sodium: 138 mmol/L (ref 135–145)
Total Bilirubin: 0.5 mg/dL (ref 0.3–1.2)
Total Protein: 6.1 g/dL — ABNORMAL LOW (ref 6.5–8.1)

## 2018-01-09 LAB — CBC
HCT: 30.4 % — ABNORMAL LOW (ref 36.0–46.0)
Hemoglobin: 9.9 g/dL — ABNORMAL LOW (ref 12.0–15.0)
MCH: 31.4 pg (ref 26.0–34.0)
MCHC: 32.6 g/dL (ref 30.0–36.0)
MCV: 96.5 fL (ref 78.0–100.0)
Platelets: 173 10*3/uL (ref 150–400)
RBC: 3.15 MIL/uL — ABNORMAL LOW (ref 3.87–5.11)
RDW: 14.1 % (ref 11.5–15.5)
WBC: 5.4 10*3/uL (ref 4.0–10.5)

## 2018-01-09 SURGERY — ERCP, WITH INTERVENTION IF INDICATED
Anesthesia: General

## 2018-01-09 MED ORDER — IOPAMIDOL (ISOVUE-300) INJECTION 61%
INTRAVENOUS | Status: AC
  Filled 2018-01-09: qty 50

## 2018-01-09 MED ORDER — DEXAMETHASONE SODIUM PHOSPHATE 10 MG/ML IJ SOLN
INTRAMUSCULAR | Status: DC | PRN
  Administered 2018-01-09: 10 mg via INTRAVENOUS

## 2018-01-09 MED ORDER — SODIUM CHLORIDE 0.9 % IV SOLN
INTRAVENOUS | Status: DC
  Administered 2018-01-09: 10:00:00 via INTRAVENOUS

## 2018-01-09 MED ORDER — OXYCODONE HCL 5 MG PO TABS: 5.0000 mg | ORAL_TABLET | Freq: Four times a day (QID) | ORAL | 0 refills | Status: DC | PRN

## 2018-01-09 MED ORDER — LIDOCAINE 2% (20 MG/ML) 5 ML SYRINGE
INTRAMUSCULAR | Status: DC | PRN
  Administered 2018-01-09: 100 mg via INTRAVENOUS

## 2018-01-09 MED ORDER — EPHEDRINE SULFATE-NACL 50-0.9 MG/10ML-% IV SOSY
PREFILLED_SYRINGE | INTRAVENOUS | Status: DC | PRN
  Administered 2018-01-09 (×2): 10 mg via INTRAVENOUS

## 2018-01-09 MED ORDER — SODIUM CHLORIDE 0.9 % IV SOLN
INTRAVENOUS | Status: DC | PRN
  Administered 2018-01-09: 15 mL

## 2018-01-09 MED ORDER — PROPOFOL 10 MG/ML IV BOLUS
INTRAVENOUS | Status: DC | PRN
  Administered 2018-01-09: 130 mg via INTRAVENOUS

## 2018-01-09 MED ORDER — INDOMETHACIN 50 MG RE SUPP
RECTAL | Status: AC
Start: 2018-01-09 — End: 2018-01-09
  Filled 2018-01-09: qty 2

## 2018-01-09 MED ORDER — GLUCAGON HCL RDNA (DIAGNOSTIC) 1 MG IJ SOLR
INTRAMUSCULAR | Status: AC
  Filled 2018-01-09: qty 1

## 2018-01-09 MED ORDER — MIDAZOLAM HCL 2 MG/2ML IJ SOLN
INTRAMUSCULAR | Status: DC | PRN
  Administered 2018-01-09: 1 mg via INTRAVENOUS

## 2018-01-09 MED ORDER — SUCCINYLCHOLINE CHLORIDE 20 MG/ML IJ SOLN
INTRAMUSCULAR | Status: DC | PRN
  Administered 2018-01-09: 120 mg via INTRAVENOUS

## 2018-01-09 MED ORDER — FENTANYL CITRATE (PF) 250 MCG/5ML IJ SOLN
INTRAMUSCULAR | Status: DC | PRN
  Administered 2018-01-09: 100 ug via INTRAVENOUS

## 2018-01-09 MED ORDER — ONDANSETRON HCL 4 MG/2ML IJ SOLN
INTRAMUSCULAR | Status: DC | PRN
  Administered 2018-01-09: 4 mg via INTRAVENOUS

## 2018-01-09 MED ORDER — LACTATED RINGERS IV SOLN
INTRAVENOUS | Status: DC | PRN
  Administered 2018-01-09: 10:00:00 via INTRAVENOUS

## 2018-01-09 NOTE — Anesthesia Procedure Notes (Signed)
Procedure Name: Intubation Date/Time: 01/09/2018 10:41 AM Performed by: Julieta Bellini, CRNA Pre-anesthesia Checklist: Patient identified, Emergency Drugs available, Suction available and Patient being monitored Patient Re-evaluated:Patient Re-evaluated prior to induction Oxygen Delivery Method: Circle system utilized Preoxygenation: Pre-oxygenation with 100% oxygen Induction Type: IV induction Ventilation: Mask ventilation without difficulty Laryngoscope Size: Glidescope and 3 Grade View: Grade I Tube type: Oral Tube size: 7.0 mm Number of attempts: 1 Airway Equipment and Method: Stylet and Video-laryngoscopy Placement Confirmation: ETT inserted through vocal cords under direct vision,  positive ETCO2 and breath sounds checked- equal and bilateral Secured at: 21 cm Tube secured with: Tape Dental Injury: Teeth and Oropharynx as per pre-operative assessment  Difficulty Due To: Difficulty was anticipated, Difficult Airway- due to anterior larynx and Difficult Airway- due to limited oral opening Future Recommendations: Recommend- induction with short-acting agent, and alternative techniques readily available

## 2018-01-09 NOTE — Progress Notes (Signed)
[facility]/Trauma Progress Note  1 Day Post-Op   Assessment/Plan Migraines - home meds Seizure disorder - home keppra  Cholelithiasis with likely cholecystitis - US shows gallstones and wall thickening - patient clinically TTP in RUQ - S/P lap chole with positive IOC, Dr. Georgette Dover, 04/30 - ERCP today, appreciate GI's assistance  FEN: NPO then CLD after ERCP VTE: SCDs, lovenox after ERCP ID: [email or web address] 4/29>> Foley: none Follow up: [facility] office 2 weeks  DISPO: ERCP today, labs AM, likely discharge tomorrow    LOS: 0 days    Subjective: CC: abdominal pain  Pt states pain is slightly better since surgery. Tolerated clears last night and no vomiting. Nausea after surgery but this has resolved. She is having flatus. No new complaints.   Objective: Vital signs in last 24 hours: Temp:  [97.8 F (36.6 C)-98.3 F (36.8 C)] 98 F (36.7 C) (05/01 0236) Pulse Rate:  [67-95] 67 (05/01 0236) Resp:  [16-18] 16 (05/01 0236) BP: (95-117)/(63-72) 95/63 (05/01 0236) SpO2:  [84 %-100 %] 97 % (05/01 0236) Last BM Date: 01/06/18  Intake/Output from previous day: 04/30 0701 - 05/01 0700 In: 1981 [P.O.:220; I.V.:1661; IV Piggyback:100] Out: 1920 [Urine:1900; Blood:20] Intake/Output this shift: No intake/output data recorded.  PE: Gen:  Alert, NAD, pleasant, cooperative Card:  RRR, no M/G/R heard Pulm:  CTA, no W/R/R, effort normal Abd: Soft, ND, +BS, incisions C/D/I, TTP RUQ, no guarding Skin: no rashes noted, warm and dry   Anti-infectives: Anti-infectives (From admission, onward)   Start     Dose/Rate Route Frequency Ordered Stop   01/07/18 1400  cefTRIAXone (ROCEPHIN) 2 g in sodium chloride 0.9 % 100 mL IVPB     2 g 200 mL/hr over 30 Minutes Intravenous Every 24 hours 01/07/18 1257        Lab Results:  Recent Labs    01/08/18 0332 01/08/18 2347  WBC 3.8* 5.4  HGB 10.6* 9.9*  HCT 32.3* 30.4*  PLT 186 173   BMET Recent Labs    01/08/18 0332  01/08/18 2347  NA 141 138  K 3.9 3.9  CL 111 108  CO2 25 24  GLUCOSE 89 94  BUN 12 6  CREATININE 0.69 0.67  CALCIUM 8.3* 8.2*   PT/INR No results for input(s): LABPROT, INR in the last 72 hours. CMP     Component Value Date/Time   NA 138 01/08/2018 2347   K 3.9 01/08/2018 2347   CL 108 01/08/2018 2347   CO2 24 01/08/2018 2347   GLUCOSE 94 01/08/2018 2347   BUN 6 01/08/2018 2347   CREATININE 0.67 01/08/2018 2347   CALCIUM 8.2 (L) 01/08/2018 2347   PROT 6.1 (L) 01/08/2018 2347   ALBUMIN 3.1 (L) 01/08/2018 2347   AST 86 (H) 01/08/2018 2347   ALT 52 01/08/2018 2347   ALKPHOS 77 01/08/2018 2347   BILITOT 0.5 01/08/2018 2347   GFRNONAA >60 01/08/2018 2347   GFRAA >60 01/08/2018 2347   Lipase     Component Value Date/Time   LIPASE 35 01/06/2018 2340    Studies/Results: Dg Cholangiogram Operative  Result Date: 01/08/2018 CLINICAL DATA:  Laparoscopic cholecystectomy.  Cholelithiasis. EXAM: INTRAOPERATIVE CHOLANGIOGRAM TECHNIQUE: Cholangiographic images from the C-arm fluoroscopic device were submitted for interpretation post-operatively. Please see the procedural report for the amount of contrast and the fluoroscopy time utilized. COMPARISON:  Ultrasound 01/07/2018 FINDINGS: Multiple incompletely occlusive filling defects in the distal CBD. Intrahepatic ducts are incompletely visualized, appearing decompressed centrally. Contrast passes into the duodenum. : Incompletely occlusive  filling defects in the distal CBD may represent calculi or clot. Electronically Signed   By: Lucrezia Europe M.D.   On: 01/08/2018 10:06   US Abdomen Limited Ruq  Addendum Date: 01/07/2018   ADDENDUM REPORT: 01/07/2018 12:24 ADDENDUM: These results were called by telephone at the time of interpretation on 01/07/2018 at 12:13 pm to Dr. Marda Stalker , who verbally acknowledged these results. Electronically Signed   By: Genia Del M.D.   On: 01/07/2018 12:24   Result Date: 01/07/2018 CLINICAL DATA:   57 year old female with right upper quadrant pain. Subsequent encounter. EXAM: ULTRASOUND ABDOMEN LIMITED RIGHT UPPER QUADRANT COMPARISON:  01/02/2018 CT. FINDINGS: Gallbladder: Several mobile gallstones measuring up to 9 mm. At points there is gallbladder wall thickening measuring up to 3.1 mm. Patient was tender over the gallbladder during scanning per sonographer. Common bile duct: Diameter: 5.5 mm Liver: No focal lesion identified. Within normal limits in parenchymal echogenicity. Portal vein is patent on color Doppler imaging with normal direction of blood flow towards the liver. IMPRESSION: Gallstones measuring up to 9 mm. Mild gallbladder wall thickening. Patient tender over the gallbladder during scanning. Findings raise possibility of acute cholecystitis. These results will be called to the ordering clinician or representative by the Radiologist Assistant, and communication documented in the PACS or zVision Dashboard. Electronically Signed: By: Genia Del M.D. On: 01/07/2018 12:06      Kalman Drape , [facility] 01/09/2018, 8:04 AM  Pager: [phone] Mon-Wed, Friday 7:00am-4:30pm Thurs 7am-11:30am  Consults: [phone]

## 2018-01-09 NOTE — Discharge Instructions (Signed)
[facility] ______[facility] °LAPAROSCOPIC SURGERY: POST OP INSTRUCTIONS °Always review your discharge instruction sheet given to you by the facility where your surgery was performed. °IF YOU HAVE DISABILITY OR FAMILY LEAVE FORMS, YOU MUST BRING THEM TO THE OFFICE FOR PROCESSING.   °DO NOT GIVE THEM TO YOUR DOCTOR. ° °1. A prescription for pain medication may be given to you upon discharge.  Take your pain medication as prescribed, if needed.  If narcotic pain medicine is not needed, then you may take acetaminophen (Tylenol) or ibuprofen (Advil) as needed. °2. Take your usually prescribed medications unless otherwise directed. °3. If you need a refill on your pain medication, please contact your pharmacy.  They will contact our office to request authorization. Prescriptions will not be filled after 5pm or on week-ends. °4. You should follow a light diet the first few days after arrival home, such as soup and crackers, etc.  Be sure to include lots of fluids daily. °5. Most patients will experience some swelling and bruising in the area of the incisions.  Ice packs will help.  Swelling and bruising can take several days to resolve.  °6. It is common to experience some constipation if taking pain medication after surgery.  Increasing fluid intake and taking a stool softener (such as Colace) will usually help or prevent this problem from occurring.  A mild laxative (Milk of Magnesia or Miralax) should be taken according to package instructions if there are no bowel movements after 48 hours. °7. Unless discharge instructions indicate otherwise, you may remove your bandages 24-48 hours after surgery, and you may shower at that time.  You may have steri-strips (small skin tapes) in place directly over the incision.  These strips should be left on the skin for 7-10 days.  If your surgeon used skin glue on the incision, you may shower in 24 hours.  The glue will flake off over the next 2-3 weeks.  Any sutures or  staples will be removed at the office during your follow-up visit. °8. ACTIVITIES:  You may resume regular (light) daily activities beginning the next day--such as daily self-care, walking, climbing stairs--gradually increasing activities as tolerated.  You may have sexual intercourse when it is comfortable.  Refrain from any heavy lifting or straining until approved by your doctor. °a. You may drive when you are no longer taking prescription pain medication, you can comfortably wear a seatbelt, and you can safely maneuver your car and apply brakes. °b. RETURN TO WORK:  __________________________________________________________ °9. You should see your doctor in the office for a follow-up appointment approximately 2-3 weeks after your surgery.  Make sure that you call for this appointment within a day or two after you arrive home to insure a convenient appointment time. °10. OTHER INSTRUCTIONS: __________________________________________________________________________________________________________________________ __________________________________________________________________________________________________________________________ °WHEN TO CALL YOUR DOCTOR: °1. Fever over 101.0 °2. Inability to urinate °3. Continued bleeding from incision. °4. Increased pain, redness, or drainage from the incision. °5. Increasing abdominal pain ° °The clinic staff is available to answer your questions during regular business hours.  Please don’t hesitate to call and ask to speak to one of the nurses for clinical concerns.  If you have a medical emergency, go to the nearest emergency room or call 911.  A surgeon from [facility] is always on call at the hospital. °[address], [address], [place], [place]  [address] ? [address] [address], [place], [place]   [address] °[phone] ? [phone] ? FAX [phone] °Web site:   [email or web address] ° ° °Endoscopic Retrograde Cholangiopancreatogram, Care  After °This sheet gives you information about how to care for yourself after your procedure. Your health care provider may also give you more specific instructions. If you have problems or questions, contact your health care provider. °What can I expect after the procedure? °After the procedure, it is common to have: °· Soreness in your throat. °· Nausea. °· Bloating. °· Dizziness. °· Tiredness (fatigue). ° °Follow these instructions at home: °· Take over-the-counter and prescription medicines only as told by your health care provider. °· Do not drive for 24 hours if you were given a medicine to help you relax (sedative) during your procedure. Have someone stay with you for 24 hours after the procedure. °· Return to your normal activities as told by your health care provider. Ask your health care provider what activities are safe for you. °· Return to eating what you normally do as soon as you feel well enough or as told by your health care provider. °· Keep all follow-up visits as told by your health care provider. This is important. °Contact a health care provider if: °· You have pain in your abdomen that does not get better with medicine. °· You develop signs of infection, such as: °? Chills. °? Feeling unwell. °Get help right away if: °· You have difficulty swallowing. °· You have worsening pain in your throat, chest, or abdomen. °· You vomit bright red blood or a substance that looks like coffee grounds. °· You have bloody or very black stools. °· You have a fever. °· You have a sudden increase in swelling (bloating) in your abdomen. °Summary °· After the procedure, it is common to feel tired and to have some discomfort in your throat. °· Contact your health care provider if you have signs of infection--such as chills or feeling unwell--or if you have pain that does not improve with medicine. °· Get help right away if you have trouble swallowing, worsening pain, bloody or black vomit, bloody or black stools, a  fever, or increased swelling in your abdomen. °· Keep all follow-up visits as told by your health care provider. This is important. °This information is not intended to replace advice given to you by your health care provider. Make sure you discuss any questions you have with your health care provider. °Document Released: 06/18/2013 Document Revised: 07/17/2016 Document Reviewed: 07/17/2016 °Elsevier Interactive Patient Education © 2017 [employer] ° °

## 2018-01-09 NOTE — Transfer of Care (Signed)
Immediate Anesthesia Transfer of Care Note  Patient: Kathryn [name]  Procedure(s) Performed: ENDOSCOPIC RETROGRADE CHOLANGIOPANCREATOGRAPHY (ERCP) (N/A )  Patient Location: Endoscopy Unit  Anesthesia Type:General  Level of Consciousness: awake, alert , oriented and patient cooperative  Airway & Oxygen Therapy: Patient Spontanous Breathing and Patient connected to nasal cannula oxygen  Post-op Assessment: Report given to RN, Post -op Vital signs reviewed and stable and Patient moving all extremities X 4  Post vital signs: Reviewed and stable  Last Vitals:  Vitals Value Taken Time  BP 130/82 01/09/2018 11:24 AM  Temp    Pulse 73 01/09/2018 11:26 AM  Resp 16 01/09/2018 11:26 AM  SpO2 93 % 01/09/2018 11:26 AM  Vitals shown include unvalidated device data.  Last Pain:  Vitals:   01/09/18 0926  TempSrc: Oral  PainSc: 2       Patients Stated Pain Goal: 3 (93/23/55 7322)  Complications: No apparent anesthesia complications

## 2018-01-09 NOTE — Progress Notes (Signed)
Kathryn [name] 10:26 AM  Subjective: Patient doing fine postop with just a little bit of right upper quadrant pain and a little sore from the surgery and no new complaints and we rediscussed the procedure and answered her questions  Objective: Vital signs stable afebrile no acute distress exam please see preassessment evaluation no labs today  Assessment: CBD stones on Intra-Op cholangiogram  Plan: Okay to proceed with ERCP with anesthesia assistance  [facility]  Pager [phone] After 5PM or if no answer call [phone]

## 2018-01-09 NOTE — Addendum Note (Signed)
Addendum  created 01/09/18 4818 by Roberts Gaudy, MD   Intraprocedure Blocks edited, Sign clinical note

## 2018-01-09 NOTE — Op Note (Signed)
[facility] Patient Name: Kathryn [name] Procedure Date : 01/09/2018 MRN: [id number] Attending MD: Clarene Essex , MD Date of Birth: [birth year] CSN: [id number] Age: 57 Admit Type: Inpatient Procedure:                ERCP Indications:              Bile duct stone(s) Providers:                Clarene Essex, MD, Vista Lawman, RN, Charolette Child,                            Technician, Wyatt Haste Referring MD:              Medicines:                General Anesthesia Complications:            No immediate complications. Estimated Blood Loss:     Estimated blood loss was very minimal as above. Procedure:                Pre-Anesthesia Assessment:                           - Prior to the procedure, a History and Physical                            was performed, and patient medications and                            allergies were reviewed. The patient's tolerance of                            previous anesthesia was also reviewed. The risks                            and benefits of the procedure and the sedation                            options and risks were discussed with the patient.                            All questions were answered, and informed consent                            was obtained. Prior Anticoagulants: The patient has                            taken no previous anticoagulant or antiplatelet                            agents. ASA Grade Assessment: II - A patient with                            mild systemic disease. After reviewing the risks  and benefits, the patient was deemed in                            satisfactory condition to undergo the procedure.                           After obtaining informed consent, the scope was                            passed under direct vision. Throughout the                            procedure, the patient's blood pressure, pulse, and                            oxygen saturations were  monitored continuously. The                            Duodenoscope was introduced through the mouth, and                            used to inject contrast into and used to cannulate                            the bile duct. The ERCP was accomplished without                            difficulty. The patient tolerated the procedure                            well. Scope In: Scope Out: Findings:      The major papilla was normal.Deep selective cannulation was readily       obtained and obvious CBD stones were confirmed on the first       cholangiogram and we proceeded with the Biliary sphincterotomy which was       made with a Hydratome sphincterotome using ERBE electrocautery until we       had adequate biliary drainage and most of the stones passed on their own       and we could get a fully bowed sphincterotome easily in and out of the       duct. The sphincterotomy oozed blood after the fourth balloon       pull-through only which stopped quickly on its own. The biliary tree was       swept with a 12-15 mm balloon starting at the upper third of the main       bile duct, bifurcation and left main hepatic duct. All stones were       removed. Nothing was found on occlusion cholangiogram at the end of the       procedure and multiple negative balloon pull throughs were done as well       using both sizes of the balloon. there was adequate biliary drainage and       no pancreatic duct injection or wire advancement was done throughout the       procedure Impression:               -  The major papilla appeared normal.                           - Choledocholithiasis was found. Complete removal                            was accomplished by biliary sphincterotomy and                            balloon extraction.                           - A biliary sphincterotomy was performed.                           - The biliary tree was swept and nothing was found.                            there is  adequate biliary drainage was adequate at                            the end of the procedure and there was no                            pancreatic duct injection our wire advancement                            throughout the procedure Moderate Sedation:      moderate sedation-none Recommendation:           - Clear liquid diet today. 5 PM if doing well may                            have soft solids and if doing great after that                            without obvious problem consider discharge later                            today or tomorrow                           - Continue present medications.                           - Return to GI clinic PRN.                           - Telephone GI clinic if symptomatic PRN. Procedure Code(s):        --- Professional ---                           [phone], Endoscopic retrograde                            cholangiopancreatography (ERCP); with removal of  calculi/debris from biliary/pancreatic duct(s)                           [phone], Endoscopic retrograde                            cholangiopancreatography (ERCP); with                            sphincterotomy/papillotomy Diagnosis Code(s):        --- Professional ---                           K80.50, Calculus of bile duct without cholangitis                            or cholecystitis without obstruction CPT copyright 2017 American Medical Association. All rights reserved. The codes documented in this report are preliminary and upon coder review may  be revised to meet current compliance requirements. Clarene Essex, MD 01/09/2018 11:21:29 AM This report has been signed electronically. Number of Addenda: 0

## 2018-01-09 NOTE — Anesthesia Preprocedure Evaluation (Signed)
Anesthesia Evaluation  Patient identified by MRN, date of birth, ID band Patient awake    Reviewed: Allergy & Precautions, NPO status , Patient's Chart, lab work & pertinent test results  Airway Mallampati: III  TM Distance: >3 FB Neck ROM: Full    Dental no notable dental hx. (+) Teeth Intact, Dental Advisory Given   Pulmonary asthma ,    Pulmonary exam normal breath sounds clear to auscultation       Cardiovascular Normal cardiovascular exam Rhythm:Regular Rate:Normal     Neuro/Psych    GI/Hepatic Neg liver ROS,   Endo/Other  negative endocrine ROS  Renal/GU negative Renal ROS  negative genitourinary   Musculoskeletal negative musculoskeletal ROS (+)   Abdominal (+) + obese,   Peds  Hematology  (+) Blood dyscrasia, anemia ,   Anesthesia Other Findings   Reproductive/Obstetrics negative OB ROS                             Anesthesia Physical  Anesthesia Plan  ASA: II  Anesthesia Plan: General   Post-op Pain Management:    Induction: Intravenous, Rapid sequence and Cricoid pressure planned  PONV Risk Score and Plan: 3 and Ondansetron and Dexamethasone  Airway Management Planned: Oral ETT and Video Laryngoscope Planned  Additional Equipment:   Intra-op Plan:   Post-operative Plan: Extubation in OR  Informed Consent: I have reviewed the patients History and Physical, chart, labs and discussed the procedure including the risks, benefits and alternatives for the proposed anesthesia with the patient or authorized representative who has indicated his/her understanding and acceptance.   Dental advisory given  Plan Discussed with: CRNA and Surgeon  Anesthesia Plan Comments:         Anesthesia Quick Evaluation

## 2018-01-09 NOTE — Anesthesia Postprocedure Evaluation (Signed)
Anesthesia Post Note  Patient: Kathryn [name]  Procedure(s) Performed: ENDOSCOPIC RETROGRADE CHOLANGIOPANCREATOGRAPHY (ERCP) (N/A )     Patient location during evaluation: Endoscopy Anesthesia Type: General Level of consciousness: awake Pain management: pain level controlled Vital Signs Assessment: post-procedure vital signs reviewed and stable Respiratory status: spontaneous breathing Cardiovascular status: stable Postop Assessment: no apparent nausea or vomiting Anesthetic complications: no    Last Vitals:  Vitals:   01/09/18 1139 01/09/18 1145  BP: 123/65 134/73  Pulse: 65 68  Resp: 16 17  Temp:    SpO2: 95% 92%    Last Pain:  Vitals:   01/09/18 1145  TempSrc:   PainSc: 0-No pain   Pain Goal: Patients Stated Pain Goal: 3 (01/08/18 1456)               [place]
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
## 2018-01-10 NOTE — Plan of Care (Signed)
  Problem: Education: Goal: Knowledge of General Education information will improve Outcome: Adequate for Discharge   Problem: Health Behavior/Discharge Planning: Goal: Ability to manage health-related needs will improve Outcome: Adequate for Discharge   Problem: Clinical Measurements: Goal: Ability to maintain clinical measurements within normal limits will improve Outcome: Adequate for Discharge Goal: Will remain free from infection Outcome: Adequate for Discharge   Problem: Activity: Goal: Risk for activity intolerance will decrease Outcome: Adequate for Discharge   Problem: Nutrition: Goal: Adequate nutrition will be maintained Outcome: Adequate for Discharge   Problem: Clinical Measurements: Goal: Postoperative complications will be avoided or minimized Outcome: Adequate for Discharge   Problem: Skin Integrity: Goal: Demonstration of wound healing without infection will improve Outcome: Adequate for Discharge

## 2018-01-10 NOTE — Progress Notes (Signed)
Pt. ready for d/c; d/c instructions reviewed and given to pt., and prescription. Pt. accompanied downstairs via w/c by Remo Lipps, NT; no problems noted, and friend with pt.

## 2018-01-24 MED FILL — AJOVY 225 MG/1.5ML SOSY: 225 | 30 days supply | Qty: 2 | Fill #1

## 2018-01-24 MED FILL — levETIRAcetam 750 MG TABS: 750 | 90 days supply | Qty: 360 | Fill #0

## 2018-01-31 MED FILL — SERTRALINE HCL 100 MG TAB: 100 | 90 days supply | Qty: 180 | Fill #0

## 2018-01-31 MED FILL — QUETIAPINE FUMARATE 50 MG T: 50 | 90 days supply | Qty: 180 | Fill #0

## 2018-02-07 ENCOUNTER — Ambulatory Visit: Payer: 59 | Admitting: Podiatry

## 2018-02-08 ENCOUNTER — Ambulatory Visit (INDEPENDENT_AMBULATORY_CARE_PROVIDER_SITE_OTHER): Payer: 59 | Admitting: Podiatry

## 2018-02-08 ENCOUNTER — Encounter: Payer: Self-pay | Admitting: Podiatry

## 2018-02-08 DIAGNOSIS — L600 Ingrowing nail: Secondary | ICD-10-CM

## 2018-02-11 NOTE — Progress Notes (Signed)
Subjective:   Patient ID: Kathryn [name], female   DOB: 57 y.o.   MRN: [id number]   HPI Patient's big toes are slightly crusted in the corner but overall the pain that she used to be and is no longer present and we have been able to achieve that   ROS      Objective:  Physical Exam  Neurovascular status intact with patient's nails crusted on the corners of the nails in the middle are healing well     Assessment:  Doing well overall with nail disease with the hallux nails growing normally in the center     Plan:  Reviewed continued conservative care and ultimately this may require other procedures to be done but I would rather she just go ahead pain to search for since she is having no pain.  Reappoint as needed

## 2018-02-25 ENCOUNTER — Encounter (HOSPITAL_COMMUNITY): Payer: 59 | Admitting: Psychiatry

## 2018-02-25 ENCOUNTER — Encounter (HOSPITAL_COMMUNITY): Payer: Self-pay

## 2018-02-25 NOTE — Progress Notes (Signed)
This encounter was created in error - please disregard.

## 2018-02-28 ENCOUNTER — Other Ambulatory Visit (HOSPITAL_COMMUNITY): Payer: Self-pay | Admitting: Psychiatry

## 2018-03-05 ENCOUNTER — Ambulatory Visit: Payer: 59 | Admitting: Family Medicine

## 2018-03-05 ENCOUNTER — Encounter: Payer: Self-pay | Admitting: Family Medicine

## 2018-03-05 VITALS — BP 120/68 | HR 83 | Temp 98.6°F | Ht 62.0 in | Wt 184.8 lb

## 2018-03-05 DIAGNOSIS — M25542 Pain in joints of left hand: Secondary | ICD-10-CM | POA: Diagnosis not present

## 2018-03-05 DIAGNOSIS — M810 Age-related osteoporosis without current pathological fracture: Secondary | ICD-10-CM

## 2018-03-05 DIAGNOSIS — M25541 Pain in joints of right hand: Secondary | ICD-10-CM

## 2018-03-05 DIAGNOSIS — E559 Vitamin D deficiency, unspecified: Secondary | ICD-10-CM | POA: Diagnosis not present

## 2018-03-05 DIAGNOSIS — G4733 Obstructive sleep apnea (adult) (pediatric): Secondary | ICD-10-CM

## 2018-03-05 DIAGNOSIS — J4520 Mild intermittent asthma, uncomplicated: Secondary | ICD-10-CM | POA: Diagnosis not present

## 2018-03-05 DIAGNOSIS — Z13220 Encounter for screening for lipoid disorders: Secondary | ICD-10-CM | POA: Diagnosis not present

## 2018-03-05 DIAGNOSIS — R5381 Other malaise: Secondary | ICD-10-CM

## 2018-03-05 DIAGNOSIS — E041 Nontoxic single thyroid nodule: Secondary | ICD-10-CM | POA: Diagnosis not present

## 2018-03-05 DIAGNOSIS — R5383 Other fatigue: Secondary | ICD-10-CM | POA: Diagnosis not present

## 2018-03-05 LAB — COMPREHENSIVE METABOLIC PANEL
ALT: 11 U/L (ref 0–35)
AST: 14 U/L (ref 0–37)
Albumin: 4.3 g/dL (ref 3.5–5.2)
Alkaline Phosphatase: 57 U/L (ref 39–117)
BUN: 21 mg/dL (ref 6–23)
CO2: 27 mEq/L (ref 19–32)
Calcium: 9.3 mg/dL (ref 8.4–10.5)
Chloride: 106 mEq/L (ref 96–112)
Creatinine, Ser: 0.7 mg/dL (ref 0.40–1.20)
GFR: 92.12 mL/min (ref >60.00)
Glucose, Bld: 84 mg/dL (ref 70–99)
Potassium: 4.4 mEq/L (ref 3.5–5.1)
Sodium: 141 mEq/L (ref 135–145)
Total Bilirubin: 0.3 mg/dL (ref 0.2–1.2)
Total Protein: 7.3 g/dL (ref 6.0–8.3)

## 2018-03-05 LAB — CBC WITH DIFFERENTIAL/PLATELET
Basophils Absolute: 0 10*3/uL (ref 0.0–0.1)
Basophils Relative: 0.9 % (ref 0.0–3.0)
Eosinophils Absolute: 0.2 10*3/uL (ref 0.0–0.7)
Eosinophils Relative: 4.3 % (ref 0.0–5.0)
HCT: 36.7 % (ref 36.0–46.0)
Hemoglobin: 12.5 g/dL (ref 12.0–15.0)
Lymphocytes Relative: 27.9 % (ref 12.0–46.0)
Lymphs Abs: 1.4 10*3/uL (ref 0.7–4.0)
MCHC: 34.1 g/dL (ref 30.0–36.0)
MCV: 94.6 fl (ref 78.0–100.0)
Monocytes Absolute: 0.5 10*3/uL (ref 0.1–1.0)
Monocytes Relative: 9 % (ref 3.0–12.0)
Neutro Abs: 2.9 10*3/uL (ref 1.4–7.7)
Neutrophils Relative %: 57.9 % (ref 43.0–77.0)
Platelets: 227 10*3/uL (ref 150.0–400.0)
RBC: 3.88 Mil/uL (ref 3.87–5.11)
RDW: 14 % (ref 11.5–15.5)
WBC: 5 10*3/uL (ref 4.0–10.5)

## 2018-03-05 LAB — VITAMIN D 25 HYDROXY (VIT D DEFICIENCY, FRACTURES): VITD: 27.61 ng/mL — ABNORMAL LOW (ref 30.00–100.00)

## 2018-03-05 LAB — LIPID PANEL
Cholesterol: 255 mg/dL — ABNORMAL HIGH (ref 0–200)
HDL: 60.6 mg/dL (ref >39.00)
LDL Cholesterol: 167 mg/dL — ABNORMAL HIGH (ref 0–99)
NonHDL: 194.29
Total CHOL/HDL Ratio: 4
Triglycerides: 136 mg/dL (ref 0.0–149.0)
VLDL: 27.2 mg/dL (ref 0.0–40.0)

## 2018-03-05 LAB — TSH: TSH: 1.28 u[IU]/mL (ref 0.35–4.50)

## 2018-03-05 MED ORDER — ALBUTEROL SULFATE HFA 108 (90 BASE) MCG/ACT IN AERS: 2.0000 | INHALATION_SPRAY | RESPIRATORY_TRACT | 1 refills | Status: DC | PRN

## 2018-03-05 MED FILL — AJOVY 225 MG/1.5ML SOSY: 225 | 30 days supply | Qty: 2 | Fill #2
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
## 2018-03-05 NOTE — Patient Instructions (Addendum)
You have an appointment scheduled for: []   2D Mammogram  [x]   3D Mammogram  [x]   Bone Density   On            At    Your appointment will at the following location  [x]   [facility]      [address] [place], [place]         []   [facility]  [address] [place], [place]

## 2018-03-06 ENCOUNTER — Other Ambulatory Visit (INDEPENDENT_AMBULATORY_CARE_PROVIDER_SITE_OTHER): Payer: 59

## 2018-03-06 DIAGNOSIS — R5383 Other fatigue: Secondary | ICD-10-CM | POA: Diagnosis not present

## 2018-03-06 LAB — SEDIMENTATION RATE: Sed Rate: 34 mm/hr — ABNORMAL HIGH (ref 0–30)

## 2018-03-06 LAB — C-REACTIVE PROTEIN: CRP: 0.2 mg/dL — ABNORMAL LOW (ref 0.5–20.0)

## 2018-03-07 LAB — RHEUMATOID FACTOR: Rhuematoid fact SerPl-aCnc: <14 IU/mL (ref <14)

## 2018-03-07 LAB — CYCLIC CITRUL PEPTIDE ANTIBODY, IGG: Cyclic Citrullin Peptide Ab: <16 UNITS

## 2018-03-10 ENCOUNTER — Encounter: Payer: Self-pay | Admitting: Family Medicine

## 2018-03-10 DIAGNOSIS — E785 Hyperlipidemia, unspecified: Secondary | ICD-10-CM | POA: Insufficient documentation
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
## 2018-03-30 ENCOUNTER — Encounter (HOSPITAL_COMMUNITY): Payer: Self-pay | Admitting: Psychiatry

## 2018-03-30 ENCOUNTER — Ambulatory Visit (INDEPENDENT_AMBULATORY_CARE_PROVIDER_SITE_OTHER): Payer: 59 | Admitting: Psychiatry

## 2018-03-30 VITALS — BP 133/97 | HR 90 | Ht 62.0 in | Wt 188.0 lb

## 2018-03-30 DIAGNOSIS — G4700 Insomnia, unspecified: Secondary | ICD-10-CM | POA: Diagnosis not present

## 2018-03-30 DIAGNOSIS — F331 Major depressive disorder, recurrent, moderate: Secondary | ICD-10-CM

## 2018-03-30 MED ORDER — QUETIAPINE FUMARATE 50 MG PO TABS: 50.0000 mg | ORAL_TABLET | Freq: Every day | ORAL | 0 refills | Status: DC

## 2018-03-30 MED ORDER — SERTRALINE HCL 100 MG PO TABS: 200.0000 mg | ORAL_TABLET | Freq: Every day | ORAL | 0 refills | Status: DC

## 2018-03-30 NOTE — Patient Instructions (Signed)
1. Continue sertraline 200 mg daily  2. Decrease quetiapine 50 mg at night  3. Referral to therapy 4. Return to clinic in three months
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
## 2018-04-23 ENCOUNTER — Encounter: Payer: Self-pay | Admitting: Neurology

## 2018-04-23 ENCOUNTER — Ambulatory Visit (INDEPENDENT_AMBULATORY_CARE_PROVIDER_SITE_OTHER): Payer: 59 | Admitting: Neurology

## 2018-04-23 VITALS — BP 108/70 | HR 80 | Ht 62.0 in | Wt 190.0 lb

## 2018-04-23 DIAGNOSIS — G43709 Chronic migraine without aura, not intractable, without status migrainosus: Secondary | ICD-10-CM

## 2018-04-23 DIAGNOSIS — Z87898 Personal history of other specified conditions: Secondary | ICD-10-CM

## 2018-04-23 MED ORDER — LEVETIRACETAM 500 MG PO TABS: 500.0000 mg | ORAL_TABLET | Freq: Two times a day (BID) | ORAL | 5 refills | Status: DC

## 2018-04-23 NOTE — Patient Instructions (Signed)
1.  Stop Ajovy.  Instead we will try Aimovig 70mg  monthly 2.  We will taper down the levetiracetam to goal of 500mg  twice daily.  Start 500mg  tablets.  Take 2 tablets twice daily for 1 week, then 1 tablet twice daily. 3.  Limit use of sumatriptan/ibuprofen/Tylenol to no more than 2 days out of week to prevent rebound headache 4.  Keep headache diary 5.  Follow up in 3 to 4 months.

## 2018-05-02 ENCOUNTER — Other Ambulatory Visit: Payer: Self-pay | Admitting: Family Medicine

## 2018-05-02 DIAGNOSIS — G43009 Migraine without aura, not intractable, without status migrainosus: Secondary | ICD-10-CM

## 2018-05-02 MED FILL — SERTRALINE HCL 100 MG TAB: 100 | 90 days supply | Qty: 180 | Fill #0

## 2018-05-02 MED FILL — VENTOLIN HFA 90 MCG INHALER: 108 (90 BAS | 17 days supply | Qty: 18 | Fill #0

## 2018-05-02 MED FILL — levETIRAcetam 500 MG TABS: 500 | 90 days supply | Qty: 180 | Fill #0

## 2018-05-02 NOTE — Telephone Encounter (Signed)
[facility] patient. 

## 2018-05-03 NOTE — Telephone Encounter (Signed)
Ok to refill 

## 2018-05-16 ENCOUNTER — Other Ambulatory Visit: Payer: Self-pay | Admitting: Family Medicine

## 2018-05-16 ENCOUNTER — Telehealth: Payer: Self-pay | Admitting: Neurology

## 2018-05-16 DIAGNOSIS — G43709 Chronic migraine without aura, not intractable, without status migrainosus: Secondary | ICD-10-CM

## 2018-05-16 DIAGNOSIS — G43009 Migraine without aura, not intractable, without status migrainosus: Secondary | ICD-10-CM

## 2018-05-16 MED ORDER — ERENUMAB-AOOE 70 MG/ML ~~LOC~~ SOAJ: 70.0000 mg | SUBCUTANEOUS | 11 refills | Status: DC

## 2018-05-16 MED FILL — AIMOVIG 70 MG/ML SOAJ: 70 | 30 days supply | Qty: 1 | Fill #0

## 2018-05-16 NOTE — Telephone Encounter (Addendum)
Submitted PA on cover my meds key:[id number]  Rcvd approval for 6 fills 05/16/18 - 11/13/18  Called and LMOVM advising Pt

## 2018-05-16 NOTE — Telephone Encounter (Signed)
Called and spoke with Pt, advised her too soon to fill. She will p/u copay card for Aimovig.

## 2018-05-16 NOTE — Telephone Encounter (Signed)
Patient is needing a refill on her sumatriptan injection medication at [facility] at [facility]. She had Ajovy and received samples of the Amivig and she wanted to know if she could get a prescription for the amivig to [facility] out patient. She wanted to know if you had a card where this medication would be free. If you need to call her it's [phone]. Thanks!

## 2018-05-16 NOTE — Telephone Encounter (Signed)
Ok to fill 

## 2018-06-06 MED FILL — AIMOVIG 70 MG/ML SOAJ: 70 | 30 days supply | Qty: 1 | Fill #1

## 2018-07-01 ENCOUNTER — Ambulatory Visit (HOSPITAL_COMMUNITY): Payer: Self-pay | Admitting: Psychiatry

## 2018-07-15 ENCOUNTER — Other Ambulatory Visit: Payer: Self-pay | Admitting: Family Medicine

## 2018-07-15 DIAGNOSIS — N6310 Unspecified lump in the right breast, unspecified quadrant: Secondary | ICD-10-CM

## 2018-07-18 ENCOUNTER — Ambulatory Visit
Admission: RE | Admit: 2018-07-18 | Discharge: 2018-07-18 | Disposition: A | Discharge status: Home / Self Care | Payer: Self-pay | Source: Ambulatory Visit | Attending: Family Medicine | Admitting: Family Medicine

## 2018-07-18 ENCOUNTER — Ambulatory Visit
Admission: RE | Admit: 2018-07-18 | Discharge: 2018-07-18 | Disposition: A | Discharge status: Home / Self Care | Payer: No Typology Code available for payment source | Source: Ambulatory Visit | Attending: Family Medicine | Admitting: Family Medicine

## 2018-07-18 ENCOUNTER — Other Ambulatory Visit: Payer: Self-pay | Admitting: Family Medicine

## 2018-07-18 DIAGNOSIS — N6310 Unspecified lump in the right breast, unspecified quadrant: Secondary | ICD-10-CM

## 2018-07-18 DIAGNOSIS — R921 Mammographic calcification found on diagnostic imaging of breast: Secondary | ICD-10-CM

## 2018-07-18 DIAGNOSIS — N6011 Diffuse cystic mastopathy of right breast: Secondary | ICD-10-CM | POA: Diagnosis not present
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
## 2018-07-24 ENCOUNTER — Ambulatory Visit (INDEPENDENT_AMBULATORY_CARE_PROVIDER_SITE_OTHER): Payer: 59 | Admitting: Neurology

## 2018-07-24 ENCOUNTER — Encounter: Payer: Self-pay | Admitting: Neurology

## 2018-07-24 VITALS — BP 90/70 | HR 82 | Ht 62.0 in | Wt 183.0 lb

## 2018-07-24 DIAGNOSIS — G43009 Migraine without aura, not intractable, without status migrainosus: Secondary | ICD-10-CM | POA: Diagnosis not present

## 2018-07-24 DIAGNOSIS — Z87898 Personal history of other specified conditions: Secondary | ICD-10-CM

## 2018-07-24 MED ORDER — ERENUMAB-AOOE 140 MG/ML ~~LOC~~ SOAJ: 140.0000 mg | SUBCUTANEOUS | 11 refills | Status: DC

## 2018-07-24 MED ORDER — ERENUMAB-AOOE 140 MG/ML ~~LOC~~ SOAJ: 140.0000 mg | SUBCUTANEOUS | 0 refills | Status: DC

## 2018-07-24 MED ORDER — LEVETIRACETAM 500 MG PO TABS: 500.0000 mg | ORAL_TABLET | Freq: Two times a day (BID) | ORAL | 3 refills | Status: DC

## 2018-07-24 MED FILL — AIMOVIG 70 MG/ML SOAJ: 70 | 30 days supply | Qty: 1 | Fill #2

## 2018-07-24 NOTE — Patient Instructions (Addendum)
1.  We will increase the Aimovig to 140mg  monthly 2.  Continue current regimen to break migraine:  Ibuprofen and Tylenol, then sumatriptan (or sumatriptan first) 3.  Limit use of pain relievers to no more than 2 days out of week to prevent risk of rebound or medication-overuse headache. 4.  Keep headache diary 5.  Continue keppra 500mg  twice daily 6.  Follow up in 4 months.
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
## 2018-07-25 NOTE — Progress Notes (Addendum)
Submitted increased Aimovig PA for 140 mg on cover my meds  Rcvd approval ref# [id number] for 12 fills 07/25/18 - 07/25/19

## 2018-08-05 NOTE — Progress Notes (Signed)
PA initiated via [email or web address] for pt's  Aimovig 140MG /ML auto-injectors

## 2018-08-07 ENCOUNTER — Ambulatory Visit: Payer: 59 | Admitting: Rheumatology

## 2018-08-07 ENCOUNTER — Ambulatory Visit (INDEPENDENT_AMBULATORY_CARE_PROVIDER_SITE_OTHER): Payer: 59

## 2018-08-07 ENCOUNTER — Encounter: Payer: Self-pay | Admitting: Rheumatology

## 2018-08-07 ENCOUNTER — Ambulatory Visit (INDEPENDENT_AMBULATORY_CARE_PROVIDER_SITE_OTHER): Payer: Self-pay

## 2018-08-07 VITALS — BP 119/77 | HR 86 | Resp 14 | Ht 63.75 in | Wt 181.0 lb

## 2018-08-07 DIAGNOSIS — M818 Other osteoporosis without current pathological fracture: Secondary | ICD-10-CM | POA: Diagnosis not present

## 2018-08-07 DIAGNOSIS — F419 Anxiety disorder, unspecified: Secondary | ICD-10-CM | POA: Diagnosis not present

## 2018-08-07 DIAGNOSIS — Z8709 Personal history of other diseases of the respiratory system: Secondary | ICD-10-CM | POA: Diagnosis not present

## 2018-08-07 DIAGNOSIS — M79642 Pain in left hand: Secondary | ICD-10-CM | POA: Diagnosis not present

## 2018-08-07 DIAGNOSIS — G8929 Other chronic pain: Secondary | ICD-10-CM

## 2018-08-07 DIAGNOSIS — Z8719 Personal history of other diseases of the digestive system: Secondary | ICD-10-CM

## 2018-08-07 DIAGNOSIS — Z8542 Personal history of malignant neoplasm of other parts of uterus: Secondary | ICD-10-CM

## 2018-08-07 DIAGNOSIS — M545 Low back pain, unspecified: Secondary | ICD-10-CM

## 2018-08-07 DIAGNOSIS — Z8781 Personal history of (healed) traumatic fracture: Secondary | ICD-10-CM | POA: Diagnosis not present

## 2018-08-07 DIAGNOSIS — M79641 Pain in right hand: Secondary | ICD-10-CM

## 2018-08-07 DIAGNOSIS — Z8639 Personal history of other endocrine, nutritional and metabolic disease: Secondary | ICD-10-CM

## 2018-08-07 DIAGNOSIS — F329 Major depressive disorder, single episode, unspecified: Secondary | ICD-10-CM

## 2018-08-07 DIAGNOSIS — E042 Nontoxic multinodular goiter: Secondary | ICD-10-CM

## 2018-08-07 DIAGNOSIS — F32A Depression, unspecified: Secondary | ICD-10-CM

## 2018-08-07 DIAGNOSIS — Z8669 Personal history of other diseases of the nervous system and sense organs: Secondary | ICD-10-CM

## 2018-08-07 DIAGNOSIS — F5104 Psychophysiologic insomnia: Secondary | ICD-10-CM

## 2018-08-07 DIAGNOSIS — Z862 Personal history of diseases of the blood and blood-forming organs and certain disorders involving the immune mechanism: Secondary | ICD-10-CM | POA: Diagnosis not present

## 2018-08-07 DIAGNOSIS — F4310 Post-traumatic stress disorder, unspecified: Secondary | ICD-10-CM

## 2018-08-07 DIAGNOSIS — G2581 Restless legs syndrome: Secondary | ICD-10-CM

## 2018-08-07 MED ORDER — DICLOFENAC SODIUM 1 % TD GEL: TRANSDERMAL | 3 refills | Status: AC

## 2018-08-07 MED FILL — levETIRAcetam 500 MG TABS: 500 | 90 days supply | Qty: 180 | Fill #1

## 2018-08-07 MED FILL — DICLOFENAC SODIUM 1% GEL: 1 | 12 days supply | Qty: 300 | Fill #0

## 2018-08-07 NOTE — Progress Notes (Signed)
Pharmacy Note  Subjective: Patient presents today to the [facility] to see Dr. Estanislado Pandy.  I was asked to see the patient regarding initiation of parathyroid hormone analog.  Patient seen by the pharmacist for counseling on Forteo. Her last Dexa scan showed T-score -2.1 but patient has history of multiple fractures.  She was on Fosamax and Prolia previously.  Prolia was discontinued for unknown reason.  Objective: CMP     Component Value Date/Time   NA 141 03/05/2018 1338   K 4.4 03/05/2018 1338   CL 106 03/05/2018 1338   CO2 27 03/05/2018 1338   GLUCOSE 84 03/05/2018 1338   BUN 21 03/05/2018 1338   CREATININE 0.70 03/05/2018 1338   CALCIUM 9.3 03/05/2018 1338   PROT 7.3 03/05/2018 1338   ALBUMIN 4.3 03/05/2018 1338   AST 14 03/05/2018 1338   ALT 11 03/05/2018 1338   ALKPHOS 57 03/05/2018 1338   BILITOT 0.3 03/05/2018 1338   GFRNONAA >60 01/08/2018 2347   GFRAA >60 01/08/2018 2347   Vitamin D: pending 08/07/18 PTH: pending 08/07/18 Phosphorus: pending 08/07/18 TSH: pending 08/07/18  Assessment/Plan: Had detailed discussion of osteoporosis and goals of therapy.  Counseled patient on the purpose, proper use, and adverse effects of Forteo.  She has used injectable medications in the past and feels comfortable with injection technique and administration. Discussed maximum use of parathyroid hormone analog of two years and then patient would need to start another osteoporosis medication.  Patient voiced understanding.  Patient denies any questions or concerns at this time.  Will apply for Foteo through [employer].  Will update her once I know status of PA. Tymlos is also another option is [place] is not approved.  Also reviewed calcium and vitamin D supplementation.  She currently takes 1000 units vitamin D and eats 3 servings of dairy a day so calcium supplementation is not needed at this time.  Also, counseled patient on Voltaren gel and natural  anti-inflammatories for osteoarthritis.  All questions encouraged and answered.  Instructed patient to call with any other questions or concerns.  Mariella Saa, PharmD, [facility] Rheumatology Clinical Pharmacist  08/07/2018 11:45 AM

## 2018-08-07 NOTE — Patient Instructions (Signed)
Hand Exercises Hand exercises can be helpful to almost anyone. These exercises can strengthen the hands, improve flexibility and movement, and increase blood flow to the hands. These results can make work and daily tasks easier. Hand exercises can be especially helpful for people who have joint pain from arthritis or have nerve damage from overuse (carpal tunnel syndrome). These exercises can also help people who have injured a hand. Most of these hand exercises are fairly gentle stretching routines. You can do them often throughout the day. Still, it is a good idea to ask your health care provider which exercises would be best for you. Warming your hands before exercise may help to reduce stiffness. You can do this with gentle massage or by placing your hands in warm water for 15 minutes. Also, make sure you pay attention to your level of hand pain as you begin an exercise routine. Exercises Knuckle Bend Repeat this exercise 5-10 times with each hand. 1. Stand or sit with your arm, hand, and all five fingers pointed straight up. Make sure your wrist is straight. 2. Gently and slowly bend your fingers down and inward until the tips of your fingers are touching the tops of your palm. 3. Hold this position for a few seconds. 4. Extend your fingers out to their original position, all pointing straight up again.  Finger Fan Repeat this exercise 5-10 times with each hand. 1. Hold your arm and hand out in front of you. Keep your wrist straight. 2. Squeeze your hand into a fist. 3. Hold this position for a few seconds. 4. Fan out, or spread apart, your hand and fingers as much as possible, stretching every joint fully.  Tabletop Repeat this exercise 5-10 times with each hand. 1. Stand or sit with your arm, hand, and all five fingers pointed straight up. Make sure your wrist is straight. 2. Gently and slowly bend your fingers at the knuckles where they meet the hand until your hand is making an  upside-down L shape. Your fingers should form a tabletop. 3. Hold this position for a few seconds. 4. Extend your fingers out to their original position, all pointing straight up again.  Making Os Repeat this exercise 5-10 times with each hand. 1. Stand or sit with your arm, hand, and all five fingers pointed straight up. Make sure your wrist is straight. 2. Make an O shape by touching your pointer finger to your thumb. Hold for a few seconds. Then open your hand wide. 3. Repeat this motion with each finger on your hand.  Table Spread Repeat this exercise 5-10 times with each hand. 1. Place your hand on a table with your palm facing down. Make sure your wrist is straight. 2. Spread your fingers out as much as possible. Hold this position for a few seconds. 3. Slide your fingers back together again. Hold for a few seconds.  Ball Grip  Repeat this exercise 10-15 times with each hand. 1. Hold a tennis ball or another soft ball in your hand. 2. While slowly increasing pressure, squeeze the ball as hard as possible. 3. Squeeze as hard as you can for 3-5 seconds. 4. Relax and repeat.  Wrist Curls Repeat this exercise 10-15 times with each hand. 1. Sit in a chair that has armrests. 2. Hold a light weight in your hand, such as a dumbbell that weighs 1-3 pounds (0.5-1.4 kg). Ask your health care provider what weight would be best for you. 3. Rest your hand just over   the end of the chair arm with your palm facing up. 4. Gently pivot your wrist up and down while holding the weight. Do not twist your wrist from side to side.  Contact a health care provider if:  Your hand pain or discomfort gets much worse when you do an exercise.  Your hand pain or discomfort does not improve within 2 hours after you exercise. If you have any of these problems, stop doing these exercises right away. Do not do them again unless your health care provider says that you can. Get help right away if:  You  develop sudden, severe hand pain. If this happens, stop doing these exercises right away. Do not do them again unless your health care provider says that you can. This information is not intended to replace advice given to you by your health care provider. Make sure you discuss any questions you have with your health care provider. Document Released: 08/09/2015 Document Revised: 02/03/2016 Document Reviewed: 03/08/2015 Elsevier Interactive Patient Education  2018 [employer]. Back Exercises The following exercises strengthen the muscles that help to support the back. They also help to keep the lower back flexible. Doing these exercises can help to prevent back pain or lessen existing pain. If you have back pain or discomfort, try doing these exercises 2-3 times each day or as told by your health care provider. When the pain goes away, do them once each day, but increase the number of times that you repeat the steps for each exercise (do more repetitions). If you do not have back pain or discomfort, do these exercises once each day or as told by your health care provider. Exercises Single Knee to Chest  Repeat these steps 3-5 times for each leg: 1. Lie on your back on a firm bed or the floor with your legs extended. 2. Bring one knee to your chest. Your other leg should stay extended and in contact with the floor. 3. Hold your knee in place by grabbing your knee or thigh. 4. Pull on your knee until you feel a gentle stretch in your lower back. 5. Hold the stretch for 10-30 seconds. 6. Slowly release and straighten your leg.  Pelvic Tilt  Repeat these steps 5-10 times: 1. Lie on your back on a firm bed or the floor with your legs extended. 2. Bend your knees so they are pointing toward the ceiling and your feet are flat on the floor. 3. Tighten your lower abdominal muscles to press your lower back against the floor. This motion will tilt your pelvis so your tailbone points up toward the  ceiling instead of pointing to your feet or the floor. 4. With gentle tension and even breathing, hold this position for 5-10 seconds.  Cat-Cow  Repeat these steps until your lower back becomes more flexible: 1. Get into a hands-and-knees position on a firm surface. Keep your hands under your shoulders, and keep your knees under your hips. You may place padding under your knees for comfort. 2. Let your head hang down, and point your tailbone toward the floor so your lower back becomes rounded like the back of a cat. 3. Hold this position for 5 seconds. 4. Slowly lift your head and point your tailbone up toward the ceiling so your back forms a sagging arch like the back of a cow. 5. Hold this position for 5 seconds.  Press-Ups  Repeat these steps 5-10 times: 1. Lie on your abdomen (face-down) on the floor. 2. Place  your palms near your head, about shoulder-width apart. 3. While you keep your back as relaxed as possible and keep your hips on the floor, slowly straighten your arms to raise the top half of your body and lift your shoulders. Do not use your back muscles to raise your upper torso. You may adjust the placement of your hands to make yourself more comfortable. 4. Hold this position for 5 seconds while you keep your back relaxed. 5. Slowly return to lying flat on the floor.  Bridges  Repeat these steps 10 times: 1. Lie on your back on a firm surface. 2. Bend your knees so they are pointing toward the ceiling and your feet are flat on the floor. 3. Tighten your buttocks muscles and lift your buttocks off of the floor until your waist is at almost the same height as your knees. You should feel the muscles working in your buttocks and the back of your thighs. If you do not feel these muscles, slide your feet 1-2 inches farther away from your buttocks. 4. Hold this position for 3-5 seconds. 5. Slowly lower your hips to the starting position, and allow your buttocks muscles to relax  completely.  If this exercise is too easy, try doing it with your arms crossed over your chest. Abdominal Crunches  Repeat these steps 5-10 times: 1. Lie on your back on a firm bed or the floor with your legs extended. 2. Bend your knees so they are pointing toward the ceiling and your feet are flat on the floor. 3. Cross your arms over your chest. 4. Tip your chin slightly toward your chest without bending your neck. 5. Tighten your abdominal muscles and slowly raise your trunk (torso) high enough to lift your shoulder blades a tiny bit off of the floor. Avoid raising your torso higher than that, because it can put too much stress on your low back and it does not help to strengthen your abdominal muscles. 6. Slowly return to your starting position.  Back Lifts Repeat these steps 5-10 times: 1. Lie on your abdomen (face-down) with your arms at your sides, and rest your forehead on the floor. 2. Tighten the muscles in your legs and your buttocks. 3. Slowly lift your chest off of the floor while you keep your hips pressed to the floor. Keep the back of your head in line with the curve in your back. Your eyes should be looking at the floor. 4. Hold this position for 3-5 seconds. 5. Slowly return to your starting position.  Contact a health care provider if:  Your back pain or discomfort gets much worse when you do an exercise.  Your back pain or discomfort does not lessen within 2 hours after you exercise. If you have any of these problems, stop doing these exercises right away. Do not do them again unless your health care provider says that you can. Get help right away if:  You develop sudden, severe back pain. If this happens, stop doing the exercises right away. Do not do them again unless your health care provider says that you can. This information is not intended to replace advice given to you by your health care provider. Make sure you discuss any questions you have with your health  care provider. Document Released: 10/05/2004 Document Revised: 01/05/2016 Document Reviewed: 10/22/2014 Elsevier Interactive Patient Education  2017 [employer].

## 2018-08-12 ENCOUNTER — Encounter: Payer: Self-pay | Admitting: Family Medicine

## 2018-08-12 ENCOUNTER — Ambulatory Visit (INDEPENDENT_AMBULATORY_CARE_PROVIDER_SITE_OTHER): Payer: 59 | Admitting: Family Medicine

## 2018-08-12 VITALS — BP 118/74 | HR 79 | Temp 97.9°F | Ht 63.75 in | Wt 187.4 lb

## 2018-08-12 DIAGNOSIS — Z23 Encounter for immunization: Secondary | ICD-10-CM

## 2018-08-12 DIAGNOSIS — S32000S Wedge compression fracture of unspecified lumbar vertebra, sequela: Secondary | ICD-10-CM | POA: Diagnosis not present

## 2018-08-12 DIAGNOSIS — G8929 Other chronic pain: Secondary | ICD-10-CM | POA: Diagnosis not present

## 2018-08-12 DIAGNOSIS — M545 Low back pain, unspecified: Secondary | ICD-10-CM

## 2018-08-12 DIAGNOSIS — J4540 Moderate persistent asthma, uncomplicated: Secondary | ICD-10-CM | POA: Diagnosis not present

## 2018-08-12 LAB — CBC WITH DIFFERENTIAL/PLATELET
Basophils Absolute: 42 cells/uL (ref 0–200)
Basophils Relative: 0.7 %
Eosinophils Absolute: 168 cells/uL (ref 15–500)
Eosinophils Relative: 2.8 %
HCT: 38.7 % (ref 35.0–45.0)
Hemoglobin: 13.2 g/dL (ref 11.7–15.5)
Lymphs Abs: 1380 cells/uL (ref 850–3900)
MCH: 32 pg (ref 27.0–33.0)
MCHC: 34.1 g/dL (ref 32.0–36.0)
MCV: 93.9 fL (ref 80.0–100.0)
MPV: 10.3 fL (ref 7.5–12.5)
Monocytes Relative: 8.1 %
Neutro Abs: 3924 cells/uL (ref 1500–7800)
Neutrophils Relative %: 65.4 %
Platelets: 254 10*3/uL (ref 140–400)
RBC: 4.12 10*6/uL (ref 3.80–5.10)
RDW: 12.9 % (ref 11.0–15.0)
Total Lymphocyte: 23 %
WBC mixed population: 486 cells/uL (ref 200–950)
WBC: 6 10*3/uL (ref 3.8–10.8)

## 2018-08-12 LAB — COMPLETE METABOLIC PANEL WITH GFR
AG Ratio: 1.4 (calc) (ref 1.0–2.5)
ALT: 14 U/L (ref 6–29)
AST: 17 U/L (ref 10–35)
Albumin: 4.5 g/dL (ref 3.6–5.1)
Alkaline phosphatase (APISO): 65 U/L (ref 33–130)
BUN: 21 mg/dL (ref 7–25)
CO2: 28 mmol/L (ref 20–32)
Calcium: 9.7 mg/dL (ref 8.6–10.4)
Chloride: 101 mmol/L (ref 98–110)
Creat: 0.71 mg/dL (ref 0.50–1.05)
GFR, Est African American: 110 mL/min/{1.73_m2} (ref >=60)
GFR, Est Non African American: 95 mL/min/{1.73_m2} (ref >=60)
Globulin: 3.2 g/dL (calc) (ref 1.9–3.7)
Glucose, Bld: 89 mg/dL (ref 65–99)
Potassium: 4.3 mmol/L (ref 3.5–5.3)
Sodium: 137 mmol/L (ref 135–146)
Total Bilirubin: 0.4 mg/dL (ref 0.2–1.2)
Total Protein: 7.7 g/dL (ref 6.1–8.1)

## 2018-08-12 LAB — PROTEIN ELECTROPHORESIS, SERUM, WITH REFLEX
Albumin ELP: 4.5 g/dL (ref 3.8–4.8)
Alpha 1: 0.3 g/dL (ref 0.2–0.3)
Alpha 2: 0.8 g/dL (ref 0.5–0.9)
Beta 2: 0.6 g/dL — ABNORMAL HIGH (ref 0.2–0.5)
Beta Globulin: 0.5 g/dL (ref 0.4–0.6)
Gamma Globulin: 1.1 g/dL (ref 0.8–1.7)
Total Protein: 7.8 g/dL (ref 6.1–8.1)

## 2018-08-12 LAB — IFE INTERPRETATION: Immunofix Electr Int: NOT DETECTED

## 2018-08-12 LAB — PARATHYROID HORMONE, INTACT (NO CA): PTH: 61 pg/mL (ref 14–64)

## 2018-08-12 LAB — PHOSPHORUS: Phosphorus: 3.5 mg/dL (ref 2.5–4.5)

## 2018-08-12 LAB — TSH: TSH: 1.25 mIU/L (ref 0.40–4.50)

## 2018-08-12 LAB — VITAMIN D 25 HYDROXY (VIT D DEFICIENCY, FRACTURES): Vit D, 25-Hydroxy: 39 ng/mL (ref 30–100)

## 2018-08-12 MED ORDER — CYCLOBENZAPRINE HCL 10 MG PO TABS: 10.0000 mg | ORAL_TABLET | Freq: Every day | ORAL | 0 refills | Status: DC

## 2018-08-12 MED ORDER — BUDESONIDE-FORMOTEROL FUMARATE 160-4.5 MCG/ACT IN AERO: 2.0000 | INHALATION_SPRAY | Freq: Two times a day (BID) | RESPIRATORY_TRACT | 3 refills | Status: DC

## 2018-08-12 MED FILL — SYMBICORT 160-4.5 MCG INH: 160-4.5 | 30 days supply | Qty: 10 | Fill #0

## 2018-08-12 MED FILL — CYCLOBENZAPRINE 10 MG TAB: 10 | 30 days supply | Qty: 30 | Fill #0
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
## 2018-08-13 ENCOUNTER — Encounter: Payer: Self-pay | Admitting: Family Medicine

## 2018-08-13 DIAGNOSIS — M19042 Primary osteoarthritis, left hand: Secondary | ICD-10-CM | POA: Insufficient documentation

## 2018-08-13 DIAGNOSIS — M19041 Primary osteoarthritis, right hand: Secondary | ICD-10-CM | POA: Insufficient documentation

## 2018-08-13 NOTE — Progress Notes (Signed)
Rcvd approval from [facility] reference # [id number] 23 fills 08/06/18 - 08/05/18 SJB
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
## 2018-08-14 MED FILL — SYMBICORT 160-4.5 MCG INH: 160-4.5 | 30 days supply | Qty: 10 | Fill #0

## 2018-08-15 MED FILL — AIMOVIG 140 MG/ML SOAJ: 140 | 28 days supply | Qty: 1 | Fill #0

## 2018-08-16 MED FILL — CEPHALEXIN 500 MG CAPSULE: 500 | 5 days supply | Qty: 15 | Fill #0

## 2018-08-16 MED FILL — PROMETHAZINE 12.5 MG TABLET: 12.5 | 4 days supply | Qty: 10 | Fill #0

## 2018-08-16 MED FILL — OXYCODONE-ACETAMINOPHEN 5-3: 5-325 | 7 days supply | Qty: 40 | Fill #0

## 2018-08-26 ENCOUNTER — Telehealth: Payer: Self-pay | Admitting: Pharmacist

## 2018-08-26 ENCOUNTER — Ambulatory Visit: Payer: 59 | Admitting: Rheumatology

## 2018-08-26 DIAGNOSIS — M818 Other osteoporosis without current pathological fracture: Secondary | ICD-10-CM

## 2018-08-26 NOTE — Telephone Encounter (Signed)
Please start the benefits investigation for Forteo/Tymlos.  She has been treated with Fosamax and Prolia in the past.  Her last DEXA scan showed T-score -2.1 but patient has history of multiple fractures.
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
## 2018-08-27 ENCOUNTER — Telehealth: Payer: Self-pay | Admitting: Rheumatology

## 2018-08-27 DIAGNOSIS — Z6832 Body mass index (BMI) 32.0-32.9, adult: Secondary | ICD-10-CM | POA: Diagnosis not present

## 2018-08-27 DIAGNOSIS — G8929 Other chronic pain: Secondary | ICD-10-CM | POA: Diagnosis not present

## 2018-08-27 DIAGNOSIS — M545 Low back pain: Secondary | ICD-10-CM | POA: Diagnosis not present

## 2018-08-27 DIAGNOSIS — S22080S Wedge compression fracture of T11-T12 vertebra, sequela: Secondary | ICD-10-CM | POA: Diagnosis not present

## 2018-08-27 NOTE — Telephone Encounter (Signed)
Patient left a voicemail stating she is checking on her prescription of Forteo.  Patient states she was waiting for prior authorization and called the pharmacy who stated they have not received the prescription yet.  Patient requested a return call.

## 2018-08-27 NOTE — Telephone Encounter (Signed)
Ran test claim for Tymlos and Forteo- both had zero copay for 1 month supply.  Per plan's formulary- [place] is required.  Received a Prior Authorization request from [employer] for [employer]. Authorization has been submitted to patient's insurance via Cover My Meds. Will update once we receive a response.  Patient is a Furniture conservator/restorer, must use a Charity fundraiser.  9:39 AM Beatriz Chancellor, CPhT

## 2018-08-29 HISTORY — PX: LIPOSUCTION: SHX10

## 2018-08-29 MED ORDER — TERIPARATIDE (RECOMBINANT) 600 MCG/2.4ML ~~LOC~~ SOLN: 20.0000 ug | Freq: Every day | SUBCUTANEOUS | 5 refills | Status: DC

## 2018-08-29 MED FILL — FORTEO 600 MCG/2.4 ML PEN I: 600 | 30 days supply | Qty: 2 | Fill #0

## 2018-08-29 NOTE — Addendum Note (Signed)
Addended by: Mariella Saa C on: 08/29/2018 02:58 PM   Modules accepted: Orders

## 2018-08-29 NOTE — Telephone Encounter (Signed)
Called to inform patient of approval and that it must be filled at [facility]. Confirmed location.  Offered injection technique training but patient declined as she uses other injectable medications.  Prescription sent to [facility].  All questions encouraged and answered.  Instructed patient to call with any further questions or concerns.  Mariella Saa, PharmD, [facility] Rheumatology Clinical Pharmacist  08/29/2018 2:56 PM

## 2018-08-29 NOTE — Telephone Encounter (Signed)
Left voicemail informing patient that a prior authorization has been sent but we have not heard a response.  Will follow up when we receive a response.   Instructed patient to call with any further questions or concerns.  Mariella Saa, PharmD, [facility] Rheumatology Clinical Pharmacist  08/29/2018 11:13 AM

## 2018-08-29 NOTE — Telephone Encounter (Signed)
Received fax notification from [employer] stating prior authorization for Forteo is not required. Patient must fill through [facility] outpatient pharmacy.  Will send document to scan center.  Phone# [phone]  12:16 PM Beatriz Chancellor, CPhT

## 2018-08-30 ENCOUNTER — Telehealth: Payer: Self-pay | Admitting: Family Medicine

## 2018-08-30 DIAGNOSIS — J4520 Mild intermittent asthma, uncomplicated: Secondary | ICD-10-CM

## 2018-08-30 DIAGNOSIS — F331 Major depressive disorder, recurrent, moderate: Secondary | ICD-10-CM

## 2018-08-30 MED ORDER — ALBUTEROL SULFATE HFA 108 (90 BASE) MCG/ACT IN AERS: 2.0000 | INHALATION_SPRAY | RESPIRATORY_TRACT | 1 refills | Status: DC | PRN

## 2018-08-30 MED FILL — VENTOLIN HFA 90 MCG INHALER: 108 (90 BAS | 17 days supply | Qty: 18 | Fill #0

## 2018-08-30 NOTE — Telephone Encounter (Signed)
Copied from [place] [id number]. Topic: Quick Communication - See Telephone Encounter >> Aug 30, 2018  1:31 PM Conception Chancy, NT wrote: CRM for notification. See Telephone encounter for: 08/30/18.  Patient is calling and requesting a refill on the following medications.   sertraline (ZOLOFT) 100 MG tablet albuterol (PROVENTIL HFA;VENTOLIN HFA) 108 (90 Base) MCG/ACT inhaler  [place], [place] - [address] [facility]. [address] [place] [address] Phone: [phone] Fax: [phone]

## 2018-08-30 NOTE — Telephone Encounter (Signed)
Requested Prescriptions  Pending Prescriptions Disp Refills  . sertraline (ZOLOFT) 100 MG tablet 180 tablet 0    Sig: Take 2 tablets (200 mg total) by mouth daily.     Psychiatry:  Antidepressants - SSRI Passed - 08/30/2018  2:06 PM      Passed - Completed PHQ-2 or PHQ-9 in the last 360 days.      Passed - Valid encounter within last 6 months    Recent Outpatient Visits          2 weeks ago Chronic bilateral low back pain without sciatica   [place] Wallace, Maria Antonia, DO   5 months ago OSA (obstructive sleep apnea)   [place] Wallace, [place], DO   7 months ago Cholecystitis   [place] Wallace, [place], [place]   10 months ago Rash   [facility] Bread, Algis Greenhouse, MD   1 year ago Chronic insomnia   [facility], Algis Greenhouse, MD      Future Appointments            In 6 days Bo Merino, MD [facility] Rheumatology         . albuterol (PROVENTIL HFA;VENTOLIN HFA) 108 (90 Base) MCG/ACT inhaler 1 Inhaler 1    Sig: Inhale 2 puffs into the lungs every 4 (four) hours as needed for wheezing or shortness of breath (cough, shortness of breath or wheezing.).     Pulmonology:  Beta Agonists Failed - 08/30/2018  2:06 PM      Failed - One inhaler should last at least one month. If the patient is requesting refills earlier, contact the patient to check for uncontrolled symptoms.      Passed - Valid encounter within last 12 months    Recent Outpatient Visits          2 weeks ago Chronic bilateral low back pain without sciatica   [employer] Wallace, [place], DO   5 months ago OSA (obstructive sleep apnea)   [place] Wallace, [place], DO   7 months ago Cholecystitis   [place] Wallace, [employer], [place]   10 months ago [place] Parker, Algis Greenhouse, MD   1 year ago Chronic  insomnia   [facility] Bread, Algis Greenhouse, MD      Future Appointments            In 6 days Bo Merino, MD [facility] Rheumatology

## 2018-09-05 ENCOUNTER — Encounter: Payer: Self-pay | Admitting: Rheumatology

## 2018-09-05 ENCOUNTER — Ambulatory Visit: Payer: 59 | Admitting: Rheumatology

## 2018-09-05 VITALS — Resp 15 | Ht 63.0 in | Wt 183.4 lb

## 2018-09-05 DIAGNOSIS — M818 Other osteoporosis without current pathological fracture: Secondary | ICD-10-CM | POA: Diagnosis not present

## 2018-09-05 DIAGNOSIS — M19041 Primary osteoarthritis, right hand: Secondary | ICD-10-CM | POA: Diagnosis not present

## 2018-09-05 DIAGNOSIS — M545 Low back pain, unspecified: Secondary | ICD-10-CM

## 2018-09-05 DIAGNOSIS — G8929 Other chronic pain: Secondary | ICD-10-CM

## 2018-09-05 DIAGNOSIS — M19042 Primary osteoarthritis, left hand: Secondary | ICD-10-CM

## 2018-09-05 DIAGNOSIS — Z8781 Personal history of (healed) traumatic fracture: Secondary | ICD-10-CM

## 2018-09-05 MED ORDER — INSULIN PEN NEEDLE 31G X 5 MM MISC: 5 refills | Status: DC

## 2018-09-05 MED FILL — UNIFINE PENTIPS 31GX3/16": 31G X 5 MM | 90 days supply | Qty: 100 | Fill #0

## 2018-09-05 MED FILL — UNIFINE PENTIPS 31GX3/16: 31G X 5 MM | 90 days supply | Qty: 100 | Fill #0

## 2018-09-05 NOTE — Telephone Encounter (Signed)
See note

## 2018-09-05 NOTE — Telephone Encounter (Signed)
Pt states that she no longer sees Dr. Modesta Messing and that she had spoke with Dr. Juleen China about this at her appt and Dr. Juleen China agreed to fill her Zoloft. Pt would like to speak with the nurse regarding and to see if the medicine can be filled. Pt is almost out of medicine.

## 2018-09-05 NOTE — Telephone Encounter (Signed)
Ok to fill do not see in note?

## 2018-09-06 ENCOUNTER — Other Ambulatory Visit: Payer: Self-pay

## 2018-09-06 DIAGNOSIS — F331 Major depressive disorder, recurrent, moderate: Secondary | ICD-10-CM

## 2018-09-06 MED ORDER — SERTRALINE HCL 100 MG PO TABS: 200.0000 mg | ORAL_TABLET | Freq: Every day | ORAL | 0 refills | Status: DC

## 2018-09-06 MED FILL — SERTRALINE HCL 100 MG TAB: 100 | 90 days supply | Qty: 180 | Fill #0

## 2018-09-06 NOTE — Telephone Encounter (Signed)
Called in message sent on my chart.

## 2018-09-06 NOTE — Telephone Encounter (Signed)
Okay refill. 

## 2018-09-17 DIAGNOSIS — M545 Low back pain: Secondary | ICD-10-CM | POA: Diagnosis not present

## 2018-10-07 ENCOUNTER — Other Ambulatory Visit: Payer: Self-pay | Admitting: Family Medicine

## 2018-10-07 DIAGNOSIS — G43009 Migraine without aura, not intractable, without status migrainosus: Secondary | ICD-10-CM

## 2018-10-07 DIAGNOSIS — M545 Low back pain: Principal | ICD-10-CM

## 2018-10-07 DIAGNOSIS — G8929 Other chronic pain: Secondary | ICD-10-CM

## 2018-10-07 MED FILL — CYCLOBENZAPRINE 10 MG TAB: 10 | 30 days supply | Qty: 30 | Fill #0

## 2018-10-07 MED FILL — AIMOVIG 140 MG/ML SOAJ: 140 | 28 days supply | Qty: 1 | Fill #1

## 2018-10-07 MED FILL — VENTOLIN HFA 90 MCG INHALER: 108 (90 BAS | 17 days supply | Qty: 18 | Fill #1

## 2018-10-07 MED FILL — FORTEO 600 MCG/2.4 ML PEN I: 600 | 28 days supply | Qty: 2 | Fill #1

## 2018-10-07 NOTE — Telephone Encounter (Signed)
Copied from [place] [id number]. Topic: Quick Communication - Rx Refill/Question >> Oct 07, 2018 11:38 AM Scherrie Gerlach wrote: Medication: SUMAtriptan 6 MG/0.5ML SOAJ With refills  Pt states she is leaving town in the morning and requesting this med called in asap. [place] [address], [place] [phone] (Phone) [phone] (Fax)

## 2018-10-08 MED ORDER — SUMATRIPTAN SUCCINATE 6 MG/0.5ML ~~LOC~~ SOAJ: SUBCUTANEOUS | 3 refills | Status: DC

## 2018-10-08 MED FILL — SYMBICORT 160-4.5 MCG INH: 160-4.5 | 30 days supply | Qty: 10 | Fill #1

## 2018-10-08 NOTE — Telephone Encounter (Signed)
Requested Prescriptions  Pending Prescriptions Disp Refills  . SUMAtriptan 6 MG/0.5ML SOAJ 2 Syringe 3     Neurology:  Migraine Therapy - Triptan Passed - 10/07/2018 11:51 AM      Passed - Last BP in normal range    BP Readings from Last 1 Encounters:  08/12/18 118/74         Passed - Valid encounter within last 12 months    Recent Outpatient Visits          1 month ago Chronic bilateral low back pain without sciatica   [place] Wallace, [place], DO   7 months ago OSA (obstructive sleep apnea)   [place] Wallace, [place], [place]   9 months ago Cholecystitis   [facility] [place], [place], [place]   12 months ago [place] Parker, Algis Greenhouse, MD   1 year ago Chronic insomnia   [facility] Bread, Algis Greenhouse, MD      Future Appointments            In 4 months [place] [facility]

## 2018-11-15 IMAGING — US US ABDOMEN LIMITED
1 series · 14 of 25 positions shown · non-contrast
Comparison: 01/02/2018 CT.

ADDENDUM:
These results were called by telephone at the time of interpretation
on 01/07/2018 at [DATE] to Dr. ROMELIC LANSER , who verbally
acknowledged these results.
CLINICAL DATA: 56-year-old female with right upper quadrant pain.
Subsequent encounter.

EXAM:
ULTRASOUND ABDOMEN LIMITED RIGHT UPPER QUADRANT

[Series 1: us abdomen limited · 0.17mm/px · 14 of 39 slices shown]
[im 1/39]
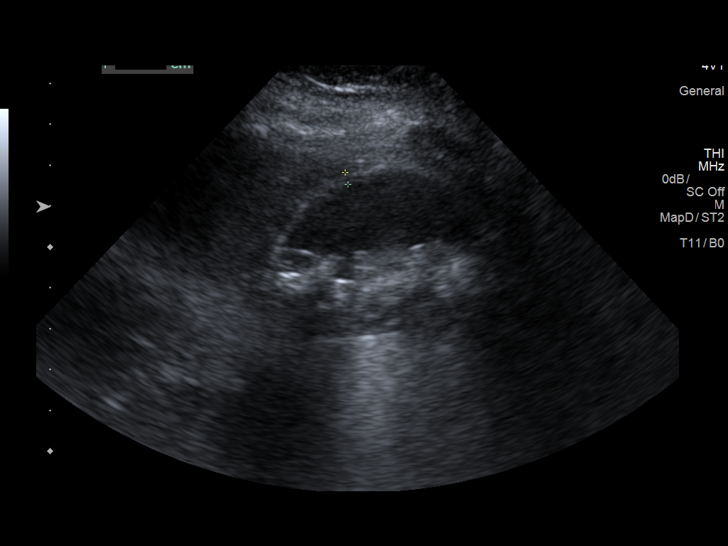
[im 4/39]
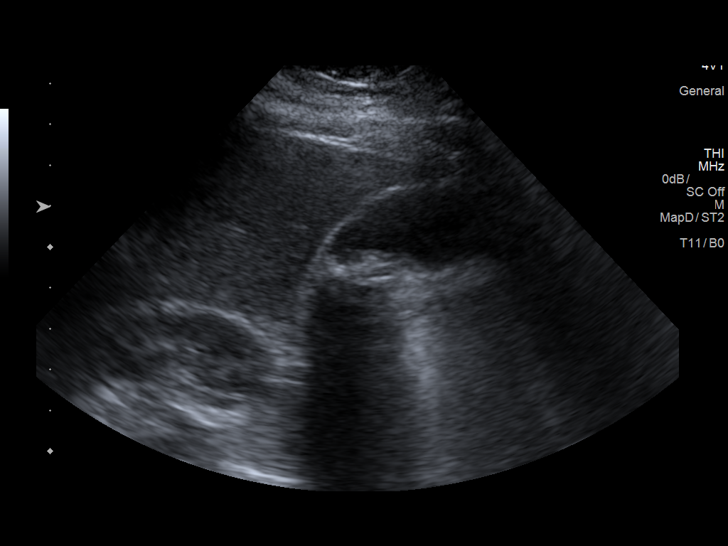
[im 7/39]
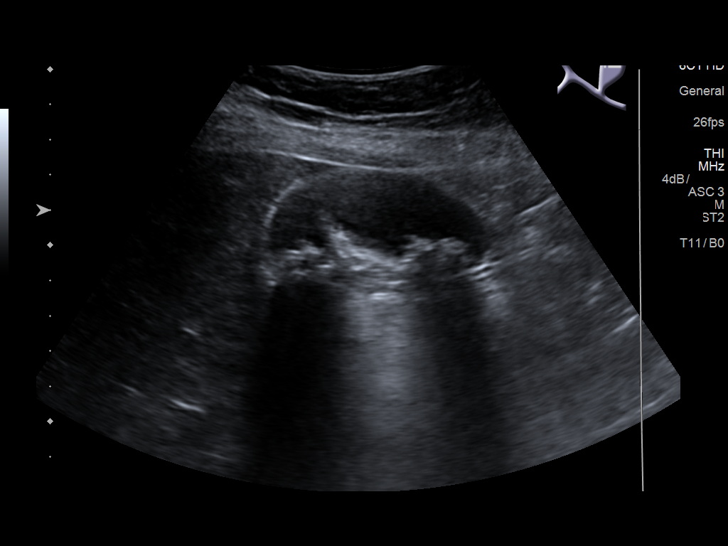
[im 10/39]
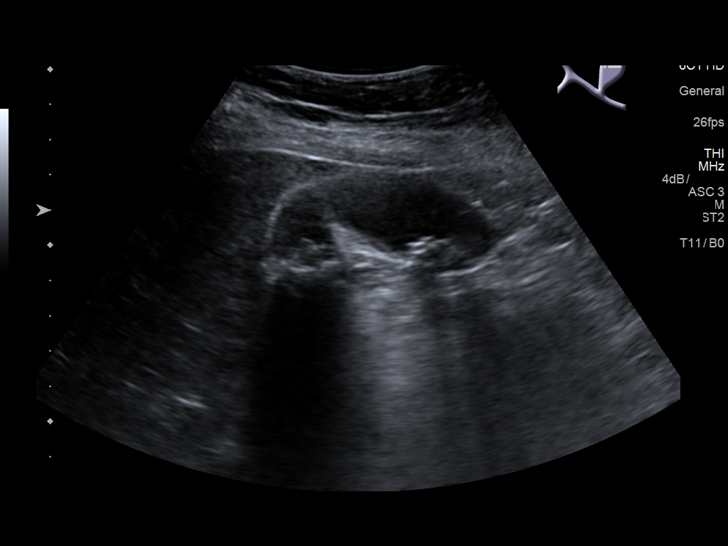
[im 13/39]
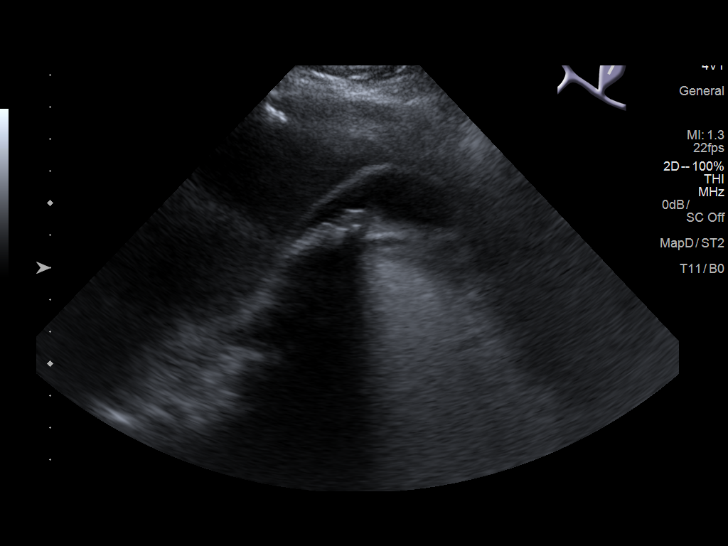
[im 15/39]
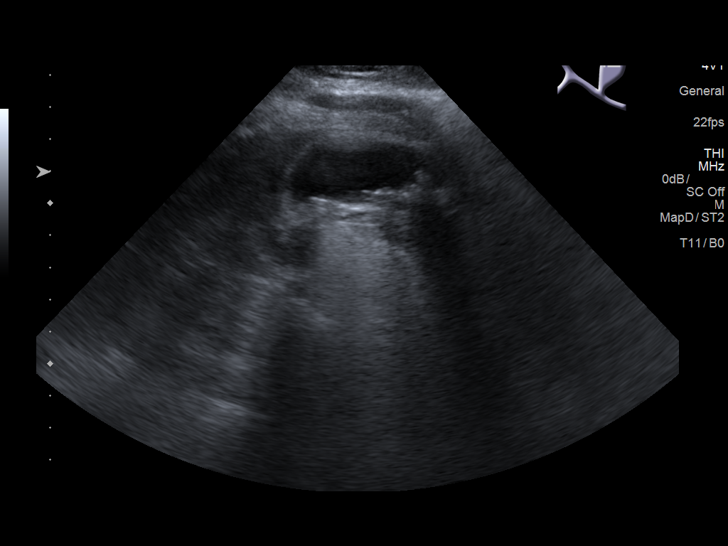
[im 18/39]
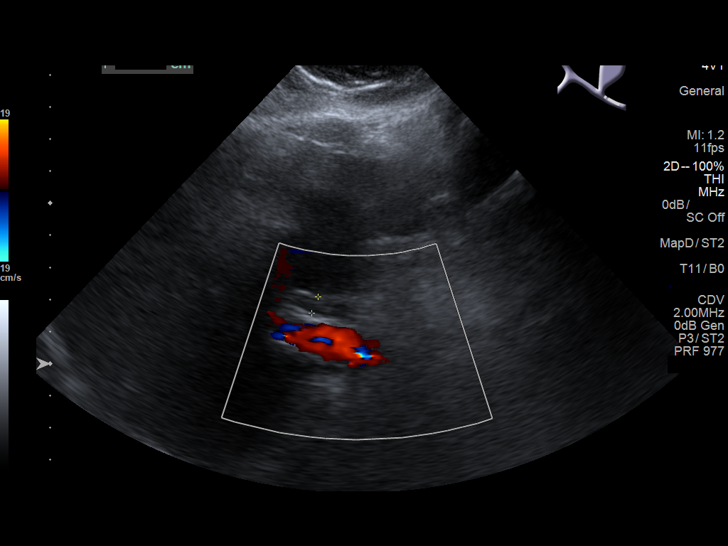
[im 21/39]
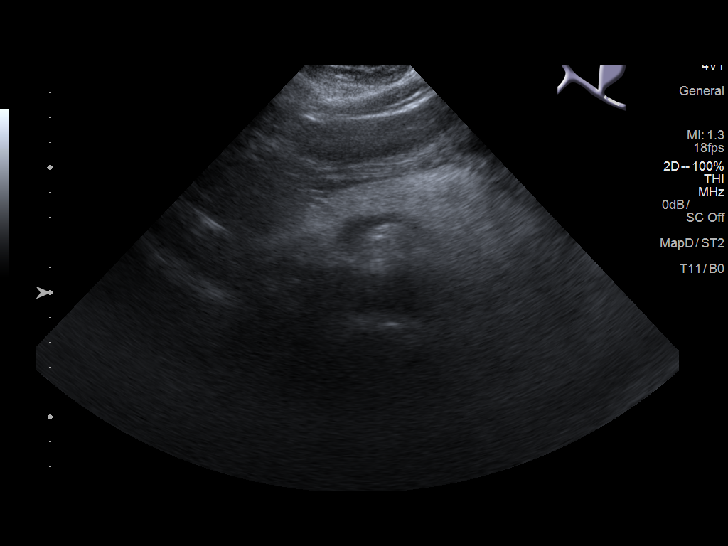
[im 24/39]
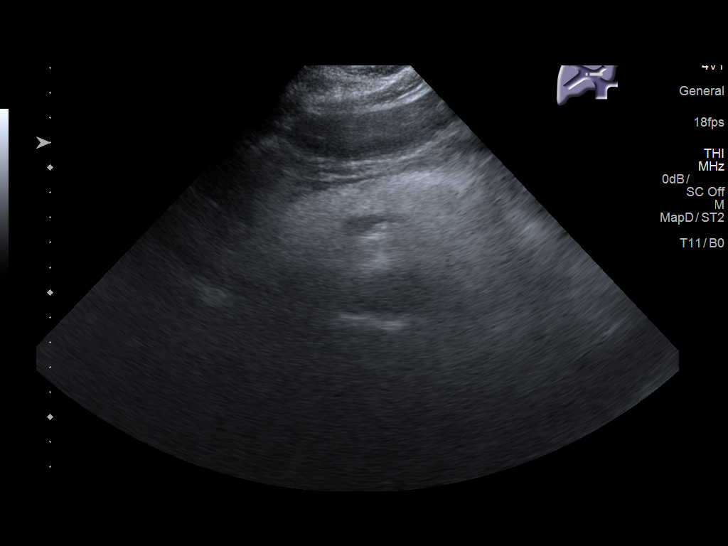
[im 26/39]
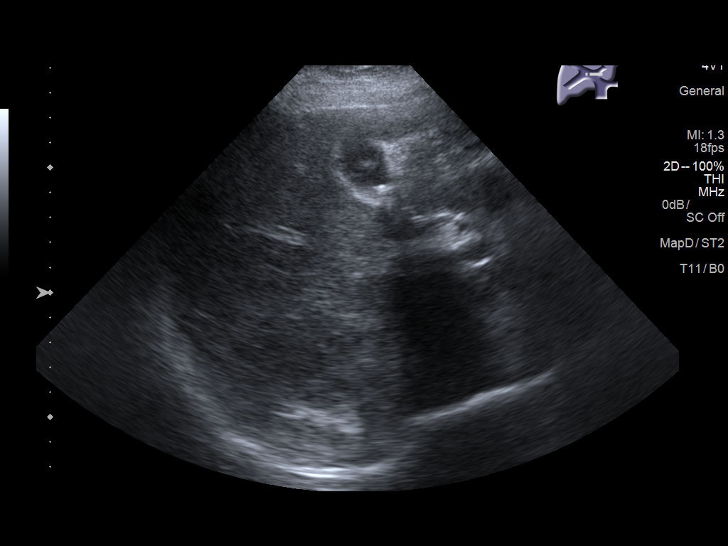
[im 29/39]
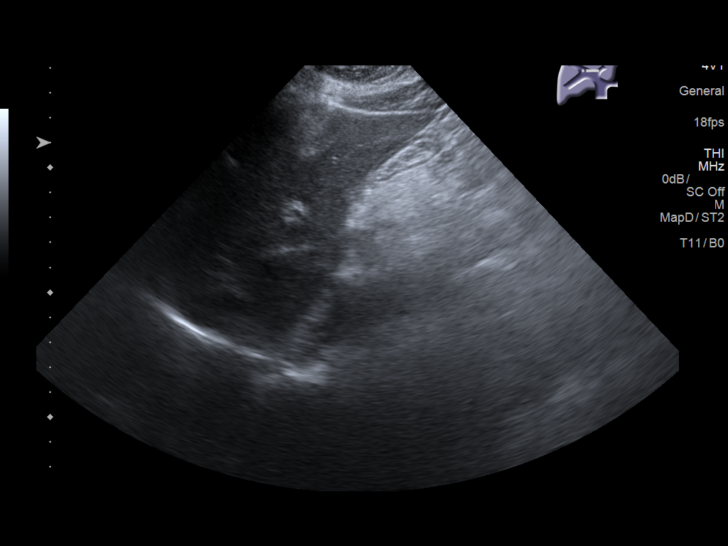
[im 32/39]
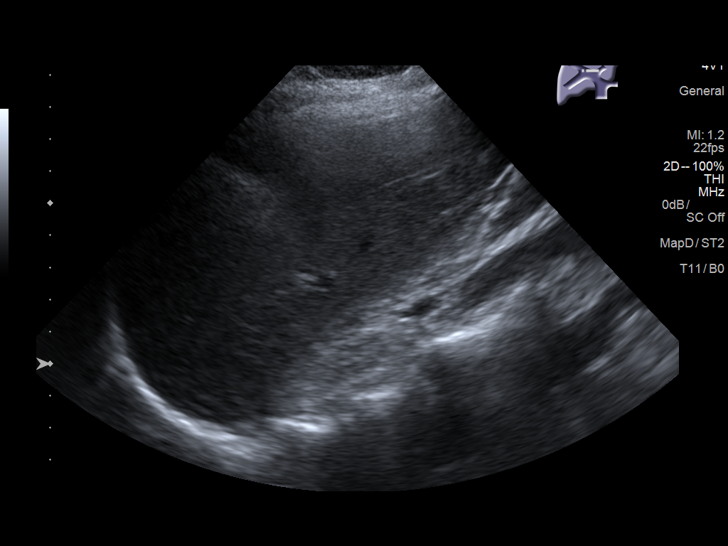
[im 35/39]
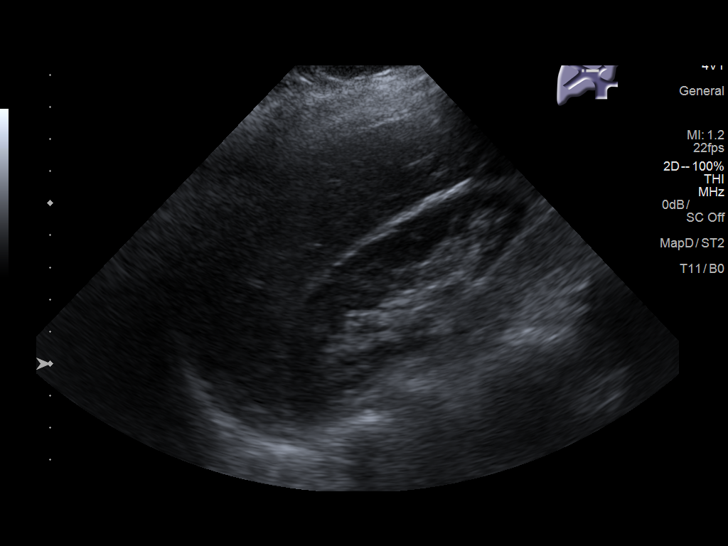
[im 39/39]
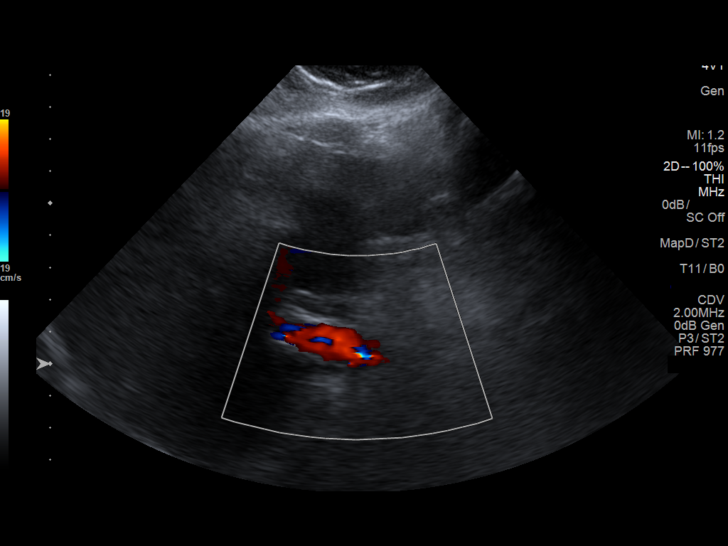

[14 of 25 positions shown; findings below may reference images not displayed]

FINDINGS: Gallbladder:

Several mobile gallstones measuring up to 9 mm. At points there is
gallbladder wall thickening measuring up to 3.1 mm. Patient was
tender over the gallbladder during scanning per sonographer.

Common bile duct:

Diameter: 5.5 mm

Liver:

No focal lesion identified. Within normal limits in parenchymal
echogenicity. Portal vein is patent on color Doppler imaging with
normal direction of blood flow towards the liver.
IMPRESSION: Gallstones measuring up to 9 mm. Mild gallbladder wall thickening.
Patient tender over the gallbladder during scanning. Findings raise
possibility of acute cholecystitis.

These results will be called to the ordering clinician or
representative by the Radiologist Assistant, and communication
documented in the PACS or zVision Dashboard.
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
## 2018-11-28 ENCOUNTER — Telehealth: Payer: No Typology Code available for payment source | Admitting: Family

## 2018-11-28 ENCOUNTER — Encounter: Payer: Self-pay | Admitting: Family Medicine

## 2018-11-28 ENCOUNTER — Ambulatory Visit: Payer: Self-pay | Admitting: Neurology

## 2018-11-28 DIAGNOSIS — J029 Acute pharyngitis, unspecified: Secondary | ICD-10-CM
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
## 2018-11-28 NOTE — Progress Notes (Signed)
Based on what you shared with me, I feel your condition warrants further evaluation and I recommend that you be seen for a face to face office visit.     NOTE: If you entered your credit card information for this eVisit, you will not be charged. You may see a "hold" on your card for the $35 but that hold will drop off and you will not have a charge processed.  If you are having a true medical emergency please call 911.  If you need an urgent face to face visit, [facility] has four urgent care centers for your convenience.    PLEASE NOTE: THE [facility] LOCATIONS AND URGENT CARE CLINICS DO NOT HAVE THE TESTING FOR CORONAVIRUS COVID19 AVAILABLE.  IF YOU FEEL YOU NEED THIS TEST YOU MUST HAVE AN ORDER TO GO TO A TESTING LOCATION FROM YOUR PROVIDER OR FROM A SCREENING E-VISIT     [email or web address] to reserve your spot online an avoid wait times  [facility] [address], [address] [place], [place] [address] 8 am to 8 pm Monday-Friday 10 am to 4 pm Saturday-Sunday *Across the street from [employer]  [facility]  [address] [place], [address] 8 am to 5 pm Monday-Friday * In the [facility]   The following sites will take your insurance:  . [facility]  [phone] Get Driving Directions Find a Provider at this Location  [address] [place], [place] [address] . 10 am to 8 pm Monday-Friday . 12 pm to 8 pm Saturday-Sunday   . [facility]  [phone] Get Driving Directions Find a Provider at this Location  [address], [address] , [place] [address] . 8 am to 8 pm Monday-Friday . 9 am to 6 pm Saturday . 11 am to 6 pm Sunday   .  [facility]  [phone] Get Driving Directions  [address] [place], [place] [address] . 8 am to 8 pm Monday-Friday . 8 am to 4 pm Saturday-Sunday   Your  e-visit answers were reviewed by a board certified advanced clinical practitioner to complete your personal care plan.  Thank you for using e-Visits. 

## 2018-11-29 ENCOUNTER — Ambulatory Visit (INDEPENDENT_AMBULATORY_CARE_PROVIDER_SITE_OTHER): Payer: 59 | Admitting: Family Medicine

## 2018-11-29 ENCOUNTER — Encounter: Payer: Self-pay | Admitting: Family Medicine

## 2018-11-29 ENCOUNTER — Other Ambulatory Visit: Payer: Self-pay

## 2018-11-29 ENCOUNTER — Encounter: Payer: No Typology Code available for payment source | Admitting: Family Medicine

## 2018-11-29 VITALS — BP 120/86 | HR 118 | Temp 97.8°F | Ht 63.0 in | Wt 182.0 lb

## 2018-11-29 DIAGNOSIS — E049 Nontoxic goiter, unspecified: Secondary | ICD-10-CM

## 2018-11-29 DIAGNOSIS — E069 Thyroiditis, unspecified: Secondary | ICD-10-CM | POA: Diagnosis not present

## 2018-11-29 DIAGNOSIS — E042 Nontoxic multinodular goiter: Secondary | ICD-10-CM

## 2018-11-29 LAB — CBC WITH DIFFERENTIAL/PLATELET
Basophils Absolute: 0 10*3/uL (ref 0.0–0.1)
Basophils Relative: 0.9 % (ref 0.0–3.0)
Eosinophils Absolute: 0.2 10*3/uL (ref 0.0–0.7)
Eosinophils Relative: 4.1 % (ref 0.0–5.0)
HCT: 38.6 % (ref 36.0–46.0)
Hemoglobin: 13.2 g/dL (ref 12.0–15.0)
Lymphocytes Relative: 31 % (ref 12.0–46.0)
Lymphs Abs: 1.6 10*3/uL (ref 0.7–4.0)
MCHC: 34.2 g/dL (ref 30.0–36.0)
MCV: 93.7 fl (ref 78.0–100.0)
Monocytes Absolute: 0.4 10*3/uL (ref 0.1–1.0)
Monocytes Relative: 8.5 % (ref 3.0–12.0)
Neutro Abs: 2.9 10*3/uL (ref 1.4–7.7)
Neutrophils Relative %: 55.5 % (ref 43.0–77.0)
Platelets: 275 10*3/uL (ref 150.0–400.0)
RBC: 4.12 Mil/uL (ref 3.87–5.11)
RDW: 14.7 % (ref 11.5–15.5)
WBC: 5.3 10*3/uL (ref 4.0–10.5)

## 2018-11-29 LAB — COMPREHENSIVE METABOLIC PANEL
ALT: 20 U/L (ref 0–35)
AST: 18 U/L (ref 0–37)
Albumin: 4.5 g/dL (ref 3.5–5.2)
Alkaline Phosphatase: 74 U/L (ref 39–117)
BUN: 20 mg/dL (ref 6–23)
CO2: 27 mEq/L (ref 19–32)
Calcium: 9.5 mg/dL (ref 8.4–10.5)
Chloride: 105 mEq/L (ref 96–112)
Creatinine, Ser: 0.77 mg/dL (ref 0.40–1.20)
GFR: 77.44 mL/min (ref >60.00)
Glucose, Bld: 110 mg/dL — ABNORMAL HIGH (ref 70–99)
Potassium: 3.8 mEq/L (ref 3.5–5.1)
Sodium: 141 mEq/L (ref 135–145)
Total Bilirubin: 0.3 mg/dL (ref 0.2–1.2)
Total Protein: 7.5 g/dL (ref 6.0–8.3)

## 2018-11-29 LAB — TSH: TSH: 1.64 u[IU]/mL (ref 0.35–4.50)

## 2018-11-29 LAB — T4, FREE: Free T4: 0.7 ng/dL (ref 0.60–1.60)
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
## 2018-12-02 LAB — THYROID PEROXIDASE ANTIBODIES (TPO) (REFL): Thyroperoxidase Ab SerPl-aCnc: 17 IU/mL — ABNORMAL HIGH (ref <9)

## 2018-12-09 ENCOUNTER — Other Ambulatory Visit: Payer: Self-pay | Admitting: Family Medicine

## 2018-12-09 DIAGNOSIS — M545 Low back pain, unspecified: Secondary | ICD-10-CM

## 2018-12-09 DIAGNOSIS — F331 Major depressive disorder, recurrent, moderate: Secondary | ICD-10-CM

## 2018-12-09 DIAGNOSIS — G8929 Other chronic pain: Secondary | ICD-10-CM

## 2018-12-09 MED FILL — SYMBICORT 160-4.5 MCG INH: 160-4.5 | 30 days supply | Qty: 10 | Fill #0

## 2018-12-09 MED FILL — SERTRALINE HCL 100 MG TAB: 100 | 90 days supply | Qty: 180 | Fill #0

## 2018-12-09 MED FILL — AIMOVIG 140 MG/ML SOAJ: 140 | 28 days supply | Qty: 1 | Fill #0

## 2018-12-09 MED FILL — levETIRAcetam 500 MG TABS: 500 | 90 days supply | Qty: 180 | Fill #0

## 2018-12-09 NOTE — Telephone Encounter (Signed)
Last VO 11/29/2018 Last refill 10/07/2018 #30/0 Next OV not scheduled

## 2018-12-09 NOTE — Telephone Encounter (Signed)
Forwarding to Dr. Wallace.  

## 2018-12-10 MED FILL — CYCLOBENZAPRINE HCL 10 MG T: 10 | 30 days supply | Qty: 30 | Fill #0

## 2018-12-20 MED FILL — FORTEO 600 MCG/2.4 ML PEN I: 600 | 28 days supply | Qty: 2 | Fill #0

## 2018-12-25 ENCOUNTER — Encounter: Payer: Self-pay | Admitting: Endocrinology

## 2018-12-25 ENCOUNTER — Encounter: Payer: Self-pay | Admitting: Internal Medicine

## 2019-01-03 ENCOUNTER — Ambulatory Visit (INDEPENDENT_AMBULATORY_CARE_PROVIDER_SITE_OTHER): Payer: 59 | Admitting: Internal Medicine

## 2019-01-03 ENCOUNTER — Encounter: Payer: Self-pay | Admitting: Internal Medicine

## 2019-01-03 DIAGNOSIS — M542 Cervicalgia: Secondary | ICD-10-CM

## 2019-01-03 DIAGNOSIS — E063 Autoimmune thyroiditis: Secondary | ICD-10-CM | POA: Diagnosis not present

## 2019-01-03 DIAGNOSIS — E042 Nontoxic multinodular goiter: Secondary | ICD-10-CM

## 2019-01-03 NOTE — Patient Instructions (Addendum)
Please come back for a follow-up appointment in 1 year.  I will order a new thyroid U/S.  Start Selenium 200 mcg daily.  Try to reduce dairy products and gluten.
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
## 2019-01-09 ENCOUNTER — Other Ambulatory Visit: Payer: Self-pay | Admitting: Family Medicine

## 2019-01-09 DIAGNOSIS — G8929 Other chronic pain: Secondary | ICD-10-CM

## 2019-01-09 DIAGNOSIS — M545 Low back pain: Principal | ICD-10-CM

## 2019-01-09 MED FILL — SYMBICORT 160-4.5 MCG INH: 160-4.5 | 30 days supply | Qty: 10 | Fill #1

## 2019-01-09 MED FILL — AIMOVIG 140 MG/ML SOAJ: 140 | 28 days supply | Qty: 1 | Fill #1

## 2019-01-09 MED FILL — DICLOFENAC SODIUM 1 % GEL: 1 | 12 days supply | Qty: 300 | Fill #0

## 2019-01-10 MED FILL — FORTEO 600 MCG/2.4 ML PEN I: 600 | 28 days supply | Qty: 2 | Fill #1

## 2019-01-10 MED FILL — CYCLOBENZAPRINE HCL 10 MG T: 10 | 30 days supply | Qty: 30 | Fill #0

## 2019-01-18 ENCOUNTER — Encounter: Payer: Self-pay | Admitting: Family Medicine

## 2019-01-20 NOTE — Telephone Encounter (Signed)
Call for app
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
## 2019-01-22 ENCOUNTER — Encounter: Payer: Self-pay | Admitting: Family Medicine

## 2019-01-22 ENCOUNTER — Telehealth (INDEPENDENT_AMBULATORY_CARE_PROVIDER_SITE_OTHER): Payer: 59 | Admitting: Family Medicine

## 2019-01-22 ENCOUNTER — Other Ambulatory Visit: Payer: Self-pay

## 2019-01-22 VITALS — BP 134/95 | HR 87 | Ht 63.0 in | Wt 182.0 lb

## 2019-01-22 DIAGNOSIS — J301 Allergic rhinitis due to pollen: Secondary | ICD-10-CM | POA: Diagnosis not present

## 2019-01-22 DIAGNOSIS — E042 Nontoxic multinodular goiter: Secondary | ICD-10-CM | POA: Diagnosis not present

## 2019-01-22 DIAGNOSIS — J4541 Moderate persistent asthma with (acute) exacerbation: Secondary | ICD-10-CM

## 2019-01-22 MED ORDER — MONTELUKAST SODIUM 10 MG PO TABS: 10.0000 mg | ORAL_TABLET | Freq: Every day | ORAL | 3 refills | Status: DC

## 2019-01-22 MED ORDER — PREDNISONE 5 MG PO TABS: ORAL_TABLET | ORAL | 0 refills | Status: DC

## 2019-01-22 MED FILL — predniSONE 5 MG (21) TBPK: 5 | 6 days supply | Qty: 21 | Fill #0

## 2019-01-22 MED FILL — MONTELUKAST SOD 10 MG TAB: 10 | 30 days supply | Qty: 30 | Fill #0

## 2019-02-07 MED FILL — AIMOVIG 140 MG/ML SOAJ: 140 | 28 days supply | Qty: 1 | Fill #2

## 2019-02-07 MED FILL — FORTEO 600 MCG/2.4 ML PEN I: 600 | 28 days supply | Qty: 2 | Fill #2

## 2019-02-07 MED FILL — DICLOFENAC SODIUM 1 % GEL: 1 | 12 days supply | Qty: 300 | Fill #1

## 2019-02-09 ENCOUNTER — Encounter: Payer: Self-pay | Admitting: Family Medicine

## 2019-02-09 DIAGNOSIS — L600 Ingrowing nail: Secondary | ICD-10-CM

## 2019-02-10 HISTORY — PX: OTHER SURGICAL HISTORY: SHX169

## 2019-02-11 DIAGNOSIS — M205X1 Other deformities of toe(s) (acquired), right foot: Secondary | ICD-10-CM | POA: Diagnosis not present

## 2019-02-11 DIAGNOSIS — L03031 Cellulitis of right toe: Secondary | ICD-10-CM | POA: Diagnosis not present

## 2019-02-11 DIAGNOSIS — L03032 Cellulitis of left toe: Secondary | ICD-10-CM | POA: Diagnosis not present

## 2019-02-11 DIAGNOSIS — M205X2 Other deformities of toe(s) (acquired), left foot: Secondary | ICD-10-CM | POA: Diagnosis not present
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
## 2019-02-17 ENCOUNTER — Ambulatory Visit
Admission: RE | Admit: 2019-02-17 | Discharge: 2019-02-17 | Disposition: A | Discharge status: Home / Self Care | Payer: 59 | Source: Ambulatory Visit | Attending: Internal Medicine | Admitting: Internal Medicine

## 2019-02-17 DIAGNOSIS — M542 Cervicalgia: Secondary | ICD-10-CM

## 2019-02-17 DIAGNOSIS — E041 Nontoxic single thyroid nodule: Secondary | ICD-10-CM | POA: Diagnosis not present

## 2019-02-17 NOTE — Addendum Note (Signed)
Addended by: Philemon Kingdom on: 02/17/2019 05:06 PM   Modules accepted: Orders

## 2019-02-18 MED FILL — MONTELUKAST SOD 10 MG TAB: 10 | 30 days supply | Qty: 30 | Fill #1

## 2019-02-25 ENCOUNTER — Encounter: Payer: Self-pay | Admitting: Physician Assistant

## 2019-02-25 ENCOUNTER — Ambulatory Visit: Payer: 59 | Admitting: Physician Assistant

## 2019-02-25 ENCOUNTER — Other Ambulatory Visit: Payer: Self-pay

## 2019-02-25 VITALS — BP 114/79 | HR 80 | Resp 14 | Ht 63.0 in | Wt 192.2 lb

## 2019-02-25 DIAGNOSIS — Z8781 Personal history of (healed) traumatic fracture: Secondary | ICD-10-CM | POA: Diagnosis not present

## 2019-02-25 DIAGNOSIS — L02612 Cutaneous abscess of left foot: Secondary | ICD-10-CM | POA: Diagnosis not present

## 2019-02-25 DIAGNOSIS — Z8542 Personal history of malignant neoplasm of other parts of uterus: Secondary | ICD-10-CM | POA: Diagnosis not present

## 2019-02-25 DIAGNOSIS — F419 Anxiety disorder, unspecified: Secondary | ICD-10-CM

## 2019-02-25 DIAGNOSIS — Z8709 Personal history of other diseases of the respiratory system: Secondary | ICD-10-CM | POA: Diagnosis not present

## 2019-02-25 DIAGNOSIS — Z8719 Personal history of other diseases of the digestive system: Secondary | ICD-10-CM

## 2019-02-25 DIAGNOSIS — F5104 Psychophysiologic insomnia: Secondary | ICD-10-CM | POA: Diagnosis not present

## 2019-02-25 DIAGNOSIS — E042 Nontoxic multinodular goiter: Secondary | ICD-10-CM

## 2019-02-25 DIAGNOSIS — F32A Depression, unspecified: Secondary | ICD-10-CM

## 2019-02-25 DIAGNOSIS — M818 Other osteoporosis without current pathological fracture: Secondary | ICD-10-CM | POA: Diagnosis not present

## 2019-02-25 DIAGNOSIS — Z8669 Personal history of other diseases of the nervous system and sense organs: Secondary | ICD-10-CM | POA: Diagnosis not present

## 2019-02-25 DIAGNOSIS — Z8639 Personal history of other endocrine, nutritional and metabolic disease: Secondary | ICD-10-CM

## 2019-02-25 DIAGNOSIS — F4310 Post-traumatic stress disorder, unspecified: Secondary | ICD-10-CM

## 2019-02-25 DIAGNOSIS — Z862 Personal history of diseases of the blood and blood-forming organs and certain disorders involving the immune mechanism: Secondary | ICD-10-CM | POA: Diagnosis not present

## 2019-02-25 DIAGNOSIS — G2581 Restless legs syndrome: Secondary | ICD-10-CM

## 2019-02-25 DIAGNOSIS — M19041 Primary osteoarthritis, right hand: Secondary | ICD-10-CM | POA: Diagnosis not present

## 2019-02-25 DIAGNOSIS — M19042 Primary osteoarthritis, left hand: Secondary | ICD-10-CM

## 2019-02-25 DIAGNOSIS — L03032 Cellulitis of left toe: Secondary | ICD-10-CM | POA: Diagnosis not present

## 2019-02-25 DIAGNOSIS — F329 Major depressive disorder, single episode, unspecified: Secondary | ICD-10-CM

## 2019-02-27 ENCOUNTER — Other Ambulatory Visit (HOSPITAL_COMMUNITY)
Admission: RE | Admit: 2019-02-27 | Discharge: 2019-02-27 | Disposition: A | Discharge status: Home / Self Care | Payer: 59 | Source: Ambulatory Visit | Attending: Radiology | Admitting: Radiology

## 2019-02-27 ENCOUNTER — Ambulatory Visit
Admission: RE | Admit: 2019-02-27 | Discharge: 2019-02-27 | Disposition: A | Discharge status: Home / Self Care | Payer: 59 | Source: Ambulatory Visit | Attending: Internal Medicine | Admitting: Internal Medicine

## 2019-02-27 DIAGNOSIS — E041 Nontoxic single thyroid nodule: Secondary | ICD-10-CM | POA: Diagnosis not present

## 2019-02-27 DIAGNOSIS — E042 Nontoxic multinodular goiter: Secondary | ICD-10-CM

## 2019-03-07 ENCOUNTER — Ambulatory Visit
Admission: RE | Admit: 2019-03-07 | Discharge: 2019-03-07 | Disposition: A | Discharge status: Home / Self Care | Payer: 59 | Source: Ambulatory Visit | Attending: Family Medicine | Admitting: Family Medicine

## 2019-03-07 ENCOUNTER — Other Ambulatory Visit: Payer: Self-pay

## 2019-03-07 ENCOUNTER — Other Ambulatory Visit: Payer: Self-pay | Admitting: Family Medicine

## 2019-03-07 DIAGNOSIS — R921 Mammographic calcification found on diagnostic imaging of breast: Secondary | ICD-10-CM

## 2019-03-11 ENCOUNTER — Other Ambulatory Visit: Payer: Self-pay | Admitting: Family Medicine

## 2019-03-11 DIAGNOSIS — L03032 Cellulitis of left toe: Secondary | ICD-10-CM | POA: Diagnosis not present

## 2019-03-11 DIAGNOSIS — G8929 Other chronic pain: Secondary | ICD-10-CM

## 2019-03-11 DIAGNOSIS — L03031 Cellulitis of right toe: Secondary | ICD-10-CM | POA: Diagnosis not present

## 2019-03-11 DIAGNOSIS — F331 Major depressive disorder, recurrent, moderate: Secondary | ICD-10-CM

## 2019-03-11 MED FILL — levETIRAcetam 500 MG TABS: 500 | 90 days supply | Qty: 180 | Fill #1

## 2019-03-11 MED FILL — UNIFINE PENTIPS 31GX3/16: 31G X 5 MM | 90 days supply | Qty: 100 | Fill #0

## 2019-03-11 MED FILL — FORTEO 600 MCG/2.4 ML PEN I: 600 | 28 days supply | Qty: 2 | Fill #3

## 2019-03-11 MED FILL — UNIFINE PENTIPS 31GX3/16": 31G X 5 MM | 90 days supply | Qty: 100 | Fill #0

## 2019-03-11 MED FILL — AIMOVIG 140 MG/ML SOAJ: 140 | 28 days supply | Qty: 1 | Fill #3

## 2019-03-12 MED FILL — CYCLOBENZAPRINE HCL 10 MG T: 10 | 30 days supply | Qty: 30 | Fill #0

## 2019-03-12 MED FILL — SERTRALINE HCL 100 MG TABS: 100 | 90 days supply | Qty: 180 | Fill #0

## 2019-03-14 MED FILL — MONTELUKAST SOD 10 MG TAB: 10 | 30 days supply | Qty: 30 | Fill #2

## 2019-04-22 ENCOUNTER — Other Ambulatory Visit: Payer: Self-pay

## 2019-04-22 ENCOUNTER — Other Ambulatory Visit: Payer: Self-pay | Admitting: Family Medicine

## 2019-04-22 DIAGNOSIS — F331 Major depressive disorder, recurrent, moderate: Secondary | ICD-10-CM

## 2019-04-22 DIAGNOSIS — G8929 Other chronic pain: Secondary | ICD-10-CM

## 2019-04-22 DIAGNOSIS — M545 Low back pain, unspecified: Secondary | ICD-10-CM

## 2019-04-22 DIAGNOSIS — G43009 Migraine without aura, not intractable, without status migrainosus: Secondary | ICD-10-CM

## 2019-04-22 MED ORDER — SERTRALINE HCL 100 MG PO TABS: 200.0000 mg | ORAL_TABLET | Freq: Every day | ORAL | 0 refills | Status: DC

## 2019-04-22 MED ORDER — CYCLOBENZAPRINE HCL 10 MG PO TABS: 10.0000 mg | ORAL_TABLET | Freq: Every day | ORAL | 0 refills | Status: DC

## 2019-04-22 MED FILL — SERTRALINE HCL 100 MG TAB: 100 | 90 days supply | Qty: 180 | Fill #0

## 2019-04-22 MED FILL — CYCLOBENZAPRINE HCL 10 MG T: 10 | 30 days supply | Qty: 30 | Fill #0

## 2019-04-23 MED ORDER — AIMOVIG 140 MG/ML ~~LOC~~ SOAJ: 140.0000 mg | SUBCUTANEOUS | 0 refills | Status: DC

## 2019-04-30 MED FILL — MONTELUKAST SOD 10 MG TAB: 10 | 30 days supply | Qty: 30 | Fill #3

## 2019-05-02 MED FILL — KETOCONAZOLE 2% CREAM: 2 | 30 days supply | Qty: 60 | Fill #0

## 2019-05-06 MED FILL — DOXYCYCLINE HYCLATE 50 MG T: 50 | 30 days supply | Qty: 60 | Fill #0

## 2019-05-14 ENCOUNTER — Other Ambulatory Visit: Payer: Self-pay

## 2019-05-14 ENCOUNTER — Encounter: Payer: Self-pay | Admitting: Family Medicine

## 2019-05-14 ENCOUNTER — Ambulatory Visit (INDEPENDENT_AMBULATORY_CARE_PROVIDER_SITE_OTHER): Payer: PRIVATE HEALTH INSURANCE | Admitting: Family Medicine

## 2019-05-14 VITALS — HR 106 | Ht 63.0 in | Wt 192.0 lb

## 2019-05-14 DIAGNOSIS — F5104 Psychophysiologic insomnia: Secondary | ICD-10-CM

## 2019-05-14 DIAGNOSIS — R059 Cough, unspecified: Secondary | ICD-10-CM

## 2019-05-14 DIAGNOSIS — F339 Major depressive disorder, recurrent, unspecified: Secondary | ICD-10-CM

## 2019-05-14 DIAGNOSIS — Z20822 Contact with and (suspected) exposure to covid-19: Secondary | ICD-10-CM

## 2019-05-14 DIAGNOSIS — M940 Chondrocostal junction syndrome [Tietze]: Secondary | ICD-10-CM | POA: Diagnosis not present

## 2019-05-14 DIAGNOSIS — Z20828 Contact with and (suspected) exposure to other viral communicable diseases: Secondary | ICD-10-CM

## 2019-05-14 DIAGNOSIS — J4520 Mild intermittent asthma, uncomplicated: Secondary | ICD-10-CM

## 2019-05-14 DIAGNOSIS — F411 Generalized anxiety disorder: Secondary | ICD-10-CM | POA: Diagnosis not present

## 2019-05-14 DIAGNOSIS — R05 Cough: Secondary | ICD-10-CM

## 2019-05-14 MED ORDER — CLONAZEPAM 0.5 MG PO TABS: 0.5000 mg | ORAL_TABLET | Freq: Every day | ORAL | 1 refills | Status: DC

## 2019-05-14 MED FILL — clonazePAM 0.5 MG TABS: 0.5 | 30 days supply | Qty: 30 | Fill #0
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
## 2019-05-15 ENCOUNTER — Other Ambulatory Visit: Payer: Self-pay

## 2019-05-15 ENCOUNTER — Ambulatory Visit: Payer: 59

## 2019-05-15 DIAGNOSIS — Z20822 Contact with and (suspected) exposure to covid-19: Secondary | ICD-10-CM

## 2019-05-16 LAB — NOVEL CORONAVIRUS, NAA: SARS-CoV-2, NAA: NOT DETECTED

## 2019-05-19 ENCOUNTER — Encounter: Payer: Self-pay | Admitting: Family Medicine

## 2019-05-23 ENCOUNTER — Ambulatory Visit: Payer: Self-pay | Admitting: Family Medicine

## 2019-05-28 ENCOUNTER — Other Ambulatory Visit: Payer: Self-pay | Admitting: Family Medicine

## 2019-05-28 DIAGNOSIS — J4541 Moderate persistent asthma with (acute) exacerbation: Secondary | ICD-10-CM

## 2019-05-28 DIAGNOSIS — J301 Allergic rhinitis due to pollen: Secondary | ICD-10-CM

## 2019-05-29 MED FILL — MONTELUKAST SOD 10 MG TAB: 10 | 30 days supply | Qty: 30 | Fill #0

## 2019-06-03 ENCOUNTER — Encounter: Payer: Self-pay | Admitting: Family Medicine
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
## 2019-06-05 ENCOUNTER — Encounter: Payer: Self-pay | Admitting: Family Medicine

## 2019-06-05 ENCOUNTER — Encounter: Payer: Self-pay | Admitting: Neurology

## 2019-06-05 ENCOUNTER — Telehealth (INDEPENDENT_AMBULATORY_CARE_PROVIDER_SITE_OTHER): Payer: PRIVATE HEALTH INSURANCE | Admitting: Neurology

## 2019-06-05 ENCOUNTER — Other Ambulatory Visit: Payer: Self-pay

## 2019-06-05 DIAGNOSIS — R569 Unspecified convulsions: Secondary | ICD-10-CM

## 2019-06-05 DIAGNOSIS — G43009 Migraine without aura, not intractable, without status migrainosus: Secondary | ICD-10-CM

## 2019-06-05 MED ORDER — SUMATRIPTAN SUCCINATE 100 MG PO TABS: 100.0000 mg | ORAL_TABLET | Freq: Once | ORAL | 4 refills | Status: DC | PRN

## 2019-06-05 MED ORDER — AIMOVIG 140 MG/ML ~~LOC~~ SOAJ: 140.0000 mg | SUBCUTANEOUS | 11 refills | Status: DC

## 2019-06-05 NOTE — Addendum Note (Signed)
Addended by: Elta Guadeloupe on: 06/05/2019 01:14 PM   Modules accepted: Orders

## 2019-06-05 NOTE — Telephone Encounter (Signed)
Called l/m to call office  

## 2019-06-12 ENCOUNTER — Encounter: Payer: Self-pay | Admitting: *Deleted

## 2019-06-12 NOTE — Progress Notes (Signed)
Called her to let her know the insurance information is not correct on the prescription side of the card. She already knew that. I informed her Aimovig is not covered by her prescription insurance 2 pens would cost [phone] but 10 sumatriptan tablets are covered with a copay of $13.14. She already knew all this and has contacted the Aimovig rep and he set her up for another 12 months with her $5 copay card as long as she maintains Pharmacist, community.

## 2019-06-13 MED FILL — clonazePAM 0.5 MG TABS: 0.5 | 30 days supply | Qty: 30 | Fill #1

## 2019-06-18 ENCOUNTER — Ambulatory Visit (INDEPENDENT_AMBULATORY_CARE_PROVIDER_SITE_OTHER): Payer: PRIVATE HEALTH INSURANCE | Admitting: Physician Assistant

## 2019-06-18 ENCOUNTER — Telehealth: Payer: Self-pay | Admitting: Family Medicine

## 2019-06-18 ENCOUNTER — Encounter: Payer: Self-pay | Admitting: Physician Assistant

## 2019-06-18 DIAGNOSIS — H1033 Unspecified acute conjunctivitis, bilateral: Secondary | ICD-10-CM | POA: Diagnosis not present

## 2019-06-18 MED ORDER — ERYTHROMYCIN 5 MG/GM OP OINT: TOPICAL_OINTMENT | OPHTHALMIC | 0 refills | Status: DC

## 2019-06-18 NOTE — Telephone Encounter (Signed)
Spoke with patient.  Symptoms sound like pink eye.  Eye is not swollen shut.  Patient is not having difficulty seeing.  Symptoms going on for about 4 days.  Started in one eye and spread to the other eye.  Patient ok with seeing Kathryn Coke, PA this afternoon for virtual visit.

## 2019-06-18 NOTE — Telephone Encounter (Signed)
Copied from [place] [phone]. Topic: General - Other >> Jun 18, 2019 10:24 AM Leward Quan A wrote: Reason for CRM: Patient called to say that she think she may have pink eye. Eyes are weeping, red, swollen and itchy she is not sure where she could have contracted it from but is concerned and need some help ASAP. Please call patient at Ph#  [phone]  Please TRIAGE for swollen eye,etc. Thank you!
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
## 2019-06-20 ENCOUNTER — Other Ambulatory Visit: Payer: No Typology Code available for payment source

## 2019-06-25 ENCOUNTER — Other Ambulatory Visit: Payer: Self-pay

## 2019-06-25 ENCOUNTER — Ambulatory Visit (INDEPENDENT_AMBULATORY_CARE_PROVIDER_SITE_OTHER): Payer: No Typology Code available for payment source | Admitting: Neurology

## 2019-06-25 DIAGNOSIS — R569 Unspecified convulsions: Secondary | ICD-10-CM

## 2019-06-27 ENCOUNTER — Telehealth: Payer: Self-pay | Admitting: Neurology

## 2019-06-27 ENCOUNTER — Other Ambulatory Visit: Payer: Self-pay | Admitting: Neurology

## 2019-06-27 DIAGNOSIS — G8929 Other chronic pain: Secondary | ICD-10-CM

## 2019-06-27 DIAGNOSIS — M545 Low back pain, unspecified: Secondary | ICD-10-CM

## 2019-06-27 MED ORDER — LEVETIRACETAM 500 MG PO TABS: 500.0000 mg | ORAL_TABLET | Freq: Two times a day (BID) | ORAL | 5 refills | Status: DC

## 2019-06-27 MED ORDER — CYCLOBENZAPRINE HCL 10 MG PO TABS: 10.0000 mg | ORAL_TABLET | Freq: Every day | ORAL | 5 refills | Status: DC

## 2019-06-27 NOTE — Telephone Encounter (Signed)
I called and spoke with Kathryn [name] regarding her EEG.  It does show intermittent left temporal slowing with some sharply-contoured theta waves.  While it is not clearly epileptiform, it may potentially be a focus for seizure.  Also, epileptiform activity could potentially be suppressed as she is currently taking Keppra.  After discussion, she would like to remain on Keppra.

## 2019-06-27 NOTE — Progress Notes (Signed)
Refilled Keppra and Flexeril.  Sent to [facility] on [address]
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
## 2019-07-11 ENCOUNTER — Encounter: Payer: Self-pay | Admitting: Physician Assistant

## 2019-07-11 ENCOUNTER — Other Ambulatory Visit: Payer: Self-pay | Admitting: Physician Assistant

## 2019-07-11 DIAGNOSIS — F411 Generalized anxiety disorder: Secondary | ICD-10-CM

## 2019-07-11 DIAGNOSIS — F5104 Psychophysiologic insomnia: Secondary | ICD-10-CM

## 2019-07-11 NOTE — Telephone Encounter (Signed)
Medication Refill - Medication: clonazePAM (KLONOPIN) 0.5 MG tablet   Pt has called requesting to upgrade dosage to 1.0 MG tablet, pt is still having trouble falling asleep and is requesting to up the dosage. Please advise  Has the patient contacted their pharmacy? Yes.   (Agent: If no, request that the patient contact the pharmacy for the refill.) (Agent: If yes, when and what did the pharmacy advise?)  Preferred Pharmacy (with phone number or street name):  [facility], [place] - [address]  [place] [address]  Phone: [phone] Fax: [phone]     Agent: Please be advised that RX refills may take up to 3 business days. We ask that you follow-up with your pharmacy. clonazePAM (KLONOPIN) 0.5 MG tablet

## 2019-07-11 NOTE — Telephone Encounter (Signed)
Requested medication (s) are due for refill today:yes  Requested medication (s) are on the active medication list: yes  Last refill:  05/14/2019  Future visit scheduled: no  Notes to clinic:  Refill cannot  be delegated    Requested Prescriptions  Pending Prescriptions Disp Refills   clonazePAM (KLONOPIN) 0.5 MG tablet 30 tablet 1    Sig: Take 1 tablet (0.5 mg total) by mouth at bedtime.     Not Delegated - Psychiatry:  Anxiolytics/Hypnotics Failed - 07/11/2019 11:23 AM      Failed - This refill cannot be delegated      Failed - Urine Drug Screen completed in last 360 days.      Passed - Valid encounter within last 6 months    Recent Outpatient Visits          3 weeks ago Acute conjunctivitis of both eyes, unspecified acute conjunctivitis type   [place] [address] [place], [place], [place]   1 month ago Chronic insomnia   [place] Wallace, [place], DO   5 months ago Moderate persistent asthma with acute exacerbation   [place] Wallace, [place], DO   7 months ago Multinodular goiter (nontoxic)   [place] Wallace, [place], [place]   11 months ago Chronic bilateral low back pain without sciatica   [place], [place], DO

## 2019-07-11 NOTE — Telephone Encounter (Signed)
See note

## 2019-07-15 ENCOUNTER — Encounter: Payer: Self-pay | Admitting: Family Medicine

## 2019-07-15 ENCOUNTER — Ambulatory Visit (INDEPENDENT_AMBULATORY_CARE_PROVIDER_SITE_OTHER): Payer: No Typology Code available for payment source | Admitting: Family Medicine

## 2019-07-15 DIAGNOSIS — G2581 Restless legs syndrome: Secondary | ICD-10-CM

## 2019-07-15 DIAGNOSIS — F5104 Psychophysiologic insomnia: Secondary | ICD-10-CM | POA: Insufficient documentation

## 2019-07-15 DIAGNOSIS — G4733 Obstructive sleep apnea (adult) (pediatric): Secondary | ICD-10-CM | POA: Diagnosis not present

## 2019-07-15 MED ORDER — ALPRAZOLAM 0.5 MG PO TABS: 0.5000 mg | ORAL_TABLET | Freq: Every day | ORAL | 2 refills | Status: DC
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
## 2019-07-16 ENCOUNTER — Telehealth: Payer: Self-pay | Admitting: *Deleted

## 2019-07-16 MED ORDER — ALPRAZOLAM 1 MG PO TABS: 0.5000 mg | ORAL_TABLET | Freq: Every day | ORAL | 2 refills | Status: DC

## 2019-07-16 NOTE — Telephone Encounter (Signed)
Pt called stating that Pharmacy was only able to give her 5 tabs of the 0.5 mg Alprazolam and states that they didn't not say when the shipment would be in. She is asking if she can have 1mg  tabs sent instead.

## 2019-07-16 NOTE — Telephone Encounter (Signed)
Please call pt. I have ordered the 1mg  alprazolam pill for her to use instead of the 0.5 due to it being unavailable at this time. Thanks.

## 2019-07-16 NOTE — Telephone Encounter (Signed)
Copied from [place] [id number]. Topic: General - Other >> Jul 16, 2019  9:37 AM Carolyn Stare wrote: Kathryn [name] called pt tolet her know about her PA being improved, he wanted the office to know that the pt was going off on  rage and wanted to let someone know >> Jul 16, 2019  9:48 AM Richardo Priest, NT wrote: Richardson Landry called again from [facility] and is wanting to speak with Dr.Andy or her nurse in regards to a disturbing phone call he got from patient that he is wanting to make PCP aware of. Please advise and call back at [phone] or [phone].

## 2019-07-17 NOTE — Telephone Encounter (Signed)
Pt was taken care of by Dr. Jonni Sanger please see other message.

## 2019-07-17 NOTE — Telephone Encounter (Signed)
Spoke to pt told her Dr. Jonni Sanger sent new Rx to pharmacy for Alprazolam 1 mg tablet due to 0.5 mg tablet not available. Pt verbalized understanding.

## 2019-07-30 ENCOUNTER — Encounter: Payer: No Typology Code available for payment source | Admitting: Family Medicine

## 2019-08-18 ENCOUNTER — Encounter: Payer: No Typology Code available for payment source | Admitting: Family Medicine

## 2019-08-29 ENCOUNTER — Other Ambulatory Visit: Payer: Self-pay | Admitting: Family Medicine

## 2019-08-29 DIAGNOSIS — F331 Major depressive disorder, recurrent, moderate: Secondary | ICD-10-CM

## 2019-08-29 MED ORDER — SERTRALINE HCL 100 MG PO TABS: 200.0000 mg | ORAL_TABLET | Freq: Every day | ORAL | 0 refills | Status: DC

## 2019-09-18 ENCOUNTER — Telehealth: Payer: Self-pay | Admitting: Family Medicine

## 2019-09-18 ENCOUNTER — Other Ambulatory Visit: Payer: No Typology Code available for payment source

## 2019-09-18 NOTE — Telephone Encounter (Signed)
Patient says that [facility] gave her a call earlier today and wanted to know if it was about her TOC appt.

## 2019-09-19 ENCOUNTER — Other Ambulatory Visit: Payer: Self-pay

## 2019-09-19 ENCOUNTER — Other Ambulatory Visit (INDEPENDENT_AMBULATORY_CARE_PROVIDER_SITE_OTHER): Payer: No Typology Code available for payment source

## 2019-09-19 DIAGNOSIS — Z419 Encounter for procedure for purposes other than remedying health state, unspecified: Secondary | ICD-10-CM

## 2019-09-19 LAB — TIQ- AMBIGUOUS ORDER

## 2019-09-19 NOTE — Addendum Note (Signed)
Addended by: Francis Dowse T on: 09/19/2019 03:06 PM   Modules accepted: Orders

## 2019-09-19 NOTE — Telephone Encounter (Signed)
Spoke with patient, she is getting labs done form another surgeon to get bone spurs removed. Patient does have her lab orders on hand and will bring those with her at her lab visit. Please let patient know that for future references we usually do not do this and she will need get his done at [facility]. Patient will also need result faxed over to them.

## 2019-09-19 NOTE — Addendum Note (Signed)
Addended by: Francis Dowse T on: 09/19/2019 03:36 PM   Modules accepted: Orders

## 2019-09-19 NOTE — Telephone Encounter (Signed)
LVM for patient to return my call as to why she has a lab visit scheduled for today.

## 2019-09-19 NOTE — Addendum Note (Signed)
Addended by: Francis Dowse T on: 09/19/2019 03:04 PM   Modules accepted: Orders

## 2019-09-20 LAB — URINALYSIS, ROUTINE W REFLEX MICROSCOPIC
Bacteria, UA: NONE SEEN /HPF
Bilirubin Urine: NEGATIVE
Glucose, UA: NEGATIVE
Hgb urine dipstick: NEGATIVE
Hyaline Cast: NONE SEEN /LPF
Nitrite: NEGATIVE
Protein, ur: NEGATIVE
Specific Gravity, Urine: 1.025 (ref 1.001–1.03)
Squamous Epithelial / HPF: NONE SEEN /HPF (ref <=5)
pH: 6.5 (ref 5.0–8.0)

## 2019-09-20 LAB — COMPREHENSIVE METABOLIC PANEL
AG Ratio: 1.6 (calc) (ref 1.0–2.5)
ALT: 11 U/L (ref 6–29)
AST: 15 U/L (ref 10–35)
Albumin: 4.2 g/dL (ref 3.6–5.1)
Alkaline phosphatase (APISO): 46 U/L (ref 37–153)
BUN: 15 mg/dL (ref 7–25)
CO2: 25 mmol/L (ref 20–32)
Calcium: 9.3 mg/dL (ref 8.6–10.4)
Chloride: 107 mmol/L (ref 98–110)
Creat: 0.82 mg/dL (ref 0.50–1.05)
Globulin: 2.7 g/dL (calc) (ref 1.9–3.7)
Glucose, Bld: 91 mg/dL (ref 65–99)
Potassium: 4.3 mmol/L (ref 3.5–5.3)
Sodium: 141 mmol/L (ref 135–146)
Total Bilirubin: 0.3 mg/dL (ref 0.2–1.2)
Total Protein: 6.9 g/dL (ref 6.1–8.1)

## 2019-09-20 LAB — CBC WITH DIFFERENTIAL/PLATELET
Absolute Monocytes: 451 cells/uL (ref 200–950)
Basophils Absolute: 28 cells/uL (ref 0–200)
Basophils Relative: 0.6 %
Eosinophils Absolute: 249 cells/uL (ref 15–500)
Eosinophils Relative: 5.3 %
HCT: 38 % (ref 35.0–45.0)
Hemoglobin: 12.8 g/dL (ref 11.7–15.5)
Lymphs Abs: 1466 cells/uL (ref 850–3900)
MCH: 31.6 pg (ref 27.0–33.0)
MCHC: 33.7 g/dL (ref 32.0–36.0)
MCV: 93.8 fL (ref 80.0–100.0)
MPV: 10.3 fL (ref 7.5–12.5)
Monocytes Relative: 9.6 %
Neutro Abs: 2505 cells/uL (ref 1500–7800)
Neutrophils Relative %: 53.3 %
Platelets: 191 10*3/uL (ref 140–400)
RBC: 4.05 10*6/uL (ref 3.80–5.10)
RDW: 13.5 % (ref 11.0–15.0)
Total Lymphocyte: 31.2 %
WBC: 4.7 10*3/uL (ref 3.8–10.8)

## 2019-09-20 LAB — MICROSCOPIC MESSAGE

## 2019-09-22 NOTE — Addendum Note (Signed)
Addended by: Francis Dowse T on: 09/22/2019 08:58 AM   Modules accepted: Orders

## 2019-09-22 NOTE — Progress Notes (Signed)
Please forward to ordering physician.  See problem.

## 2019-09-22 NOTE — Addendum Note (Signed)
Addended by: Francis Dowse T on: 09/22/2019 08:57 AM   Modules accepted: Orders

## 2019-09-24 LAB — PAIN MGMT, PROFILE 8 W/CONF, U
6 Acetylmorphine: NEGATIVE ng/mL
Alcohol Metabolites: NEGATIVE ng/mL (ref <500)
Alphahydroxyalprazolam: 362 ng/mL
Alphahydroxymidazolam: NEGATIVE ng/mL
Alphahydroxytriazolam: NEGATIVE ng/mL
Aminoclonazepam: NEGATIVE ng/mL
Amphetamines: NEGATIVE ng/mL
Benzodiazepines: POSITIVE ng/mL
Buprenorphine, Urine: NEGATIVE ng/mL
Buprenorphine: NEGATIVE ng/mL
Cocaine Metabolite: NEGATIVE ng/mL
Creatinine: 232.2 mg/dL
Hydroxyethylflurazepam: NEGATIVE ng/mL
Lorazepam: NEGATIVE ng/mL
MDMA: NEGATIVE ng/mL
Marijuana Metabolite: NEGATIVE ng/mL
Norbuprenorphine: NEGATIVE ng/mL
Nordiazepam: NEGATIVE ng/mL
Opiates: NEGATIVE ng/mL
Oxazepam: 83 ng/mL
Oxidant: NEGATIVE ug/mL
Oxycodone: NEGATIVE ng/mL
Temazepam: NEGATIVE ng/mL
pH: 7.8 (ref 4.5–9.0)

## 2019-09-24 LAB — TEST AUTHORIZATION

## 2019-09-25 ENCOUNTER — Telehealth: Payer: Self-pay

## 2019-09-25 NOTE — Telephone Encounter (Signed)
Diane from [facility] called in this morning regarding this pt. Diane stated that she receive a fax regarding this patient in error. Pt do not  belong to that office. The fax number that it was sent to was [phone].

## 2019-09-25 NOTE — Telephone Encounter (Signed)
Spoke with [facility] to get the correct fax number. Labs faxed.

## 2019-10-31 ENCOUNTER — Other Ambulatory Visit: Payer: Self-pay | Admitting: Family Medicine

## 2019-11-19 ENCOUNTER — Encounter (INDEPENDENT_AMBULATORY_CARE_PROVIDER_SITE_OTHER): Payer: Self-pay | Admitting: Family Medicine

## 2019-11-19 NOTE — Telephone Encounter (Signed)
Scheduled appt to see Dr. Jonni Sanger

## 2019-11-19 NOTE — Telephone Encounter (Signed)
Please review

## 2019-11-20 ENCOUNTER — Other Ambulatory Visit: Payer: Self-pay

## 2019-11-20 ENCOUNTER — Encounter: Payer: Self-pay | Admitting: Family Medicine

## 2019-11-20 ENCOUNTER — Ambulatory Visit (INDEPENDENT_AMBULATORY_CARE_PROVIDER_SITE_OTHER): Payer: No Typology Code available for payment source | Admitting: Family Medicine

## 2019-11-20 VITALS — BP 120/82 | HR 95 | Temp 97.6°F | Ht 63.0 in | Wt 196.6 lb

## 2019-11-20 DIAGNOSIS — N3000 Acute cystitis without hematuria: Secondary | ICD-10-CM | POA: Diagnosis not present

## 2019-11-20 LAB — POCT URINALYSIS DIPSTICK
Bilirubin, UA: POSITIVE
Blood, UA: POSITIVE
Glucose, UA: NEGATIVE
Ketones, UA: POSITIVE
Nitrite, UA: POSITIVE
Protein, UA: POSITIVE — AB
Spec Grav, UA: 1.015 (ref 1.010–1.025)
Urobilinogen, UA: 4 E.U./dL — AB
pH, UA: 6.5 (ref 5.0–8.0)

## 2019-11-20 MED ORDER — SULFAMETHOXAZOLE-TRIMETHOPRIM 800-160 MG PO TABS: 1.0000 | ORAL_TABLET | Freq: Two times a day (BID) | ORAL | 0 refills | Status: DC

## 2019-11-20 NOTE — Patient Instructions (Signed)
Please follow up as scheduled for your next visit with me: 12/16/2019   If you have any questions or concerns, please don't hesitate to send me a message via MyChart or call the office at [phone]. Thank you for visiting with Kathryn [name] today! It's our pleasure caring for you.   Urinary Tract Infection, Adult  A urinary tract infection (UTI) is an infection of any part of the urinary tract. The urinary tract includes the kidneys, ureters, bladder, and urethra. These organs make, store, and get rid of urine in the body. Your health care provider may use other names to describe the infection. An upper UTI affects the ureters and kidneys (pyelonephritis). A lower UTI affects the bladder (cystitis) and urethra (urethritis). What are the causes? Most urinary tract infections are caused by bacteria in your genital area, around the entrance to your urinary tract (urethra). These bacteria grow and cause inflammation of your urinary tract. What increases the risk? You are more likely to develop this condition if:  You have a urinary catheter that stays in place (indwelling).  You are not able to control when you urinate or have a bowel movement (you have incontinence).  You are female and you: ? Use a spermicide or diaphragm for birth control. ? Have low estrogen levels. ? Are pregnant.  You have certain genes that increase your risk (genetics).  You are sexually active.  You take antibiotic medicines.  You have a condition that causes your flow of urine to slow down, such as: ? An enlarged prostate, if you are female. ? Blockage in your urethra (stricture). ? A kidney stone. ? A nerve condition that affects your bladder control (neurogenic bladder). ? Not getting enough to drink, or not urinating often.  You have certain medical conditions, such as: ? Diabetes. ? A weak disease-fighting system (immunesystem). ? Sickle cell disease. ? Gout. ? Spinal cord injury. What are the signs or  symptoms? Symptoms of this condition include:  Needing to urinate right away (urgently).  Frequent urination or passing small amounts of urine frequently.  Pain or burning with urination.  Blood in the urine.  Urine that smells bad or unusual.  Trouble urinating.  Cloudy urine.  Vaginal discharge, if you are female.  Pain in the abdomen or the lower back. You may also have:  Vomiting or a decreased appetite.  Confusion.  Irritability or tiredness.  A fever.  Diarrhea. The first symptom in older adults may be confusion. In some cases, they may not have any symptoms until the infection has worsened. How is this diagnosed? This condition is diagnosed based on your medical history and a physical exam. You may also have other tests, including:  Urine tests.  Blood tests.  Tests for sexually transmitted infections (STIs). If you have had more than one UTI, a cystoscopy or imaging studies may be done to determine the cause of the infections. How is this treated? Treatment for this condition includes:  Antibiotic medicine.  Over-the-counter medicines to treat discomfort.  Drinking enough water to stay hydrated. If you have frequent infections or have other conditions such as a kidney stone, you may need to see a health care provider who specializes in the urinary tract (urologist). In rare cases, urinary tract infections can cause sepsis. Sepsis is a life-threatening condition that occurs when the body responds to an infection. Sepsis is treated in the hospital with IV antibiotics, fluids, and other medicines. Follow these instructions at home:  Medicines  Take over-the-counter and  prescription medicines only as told by your health care provider.  If you were prescribed an antibiotic medicine, take it as told by your health care provider. Do not stop using the antibiotic even if you start to feel better. General instructions  Make sure you: ? Empty your bladder  often and completely. Do not hold urine for long periods of time. ? Empty your bladder after sex. ? Wipe from front to back after a bowel movement if you are female. Use each tissue one time when you wipe.  Drink enough fluid to keep your urine pale yellow.  Keep all follow-up visits as told by your health care provider. This is important. Contact a health care provider if:  Your symptoms do not get better after 1-2 days.  Your symptoms go away and then return. Get help right away if you have:  Severe pain in your back or your lower abdomen.  A fever.  Nausea or vomiting. Summary  A urinary tract infection (UTI) is an infection of any part of the urinary tract, which includes the kidneys, ureters, bladder, and urethra.  Most urinary tract infections are caused by bacteria in your genital area, around the entrance to your urinary tract (urethra).  Treatment for this condition often includes antibiotic medicines.  If you were prescribed an antibiotic medicine, take it as told by your health care provider. Do not stop using the antibiotic even if you start to feel better.  Keep all follow-up visits as told by your health care provider. This is important. This information is not intended to replace advice given to you by your health care provider. Make sure you discuss any questions you have with your health care provider. Document Revised: 08/15/2018 Document Reviewed: 03/07/2018 Elsevier Patient Education  2020 [employer].
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
## 2019-11-22 LAB — URINE CULTURE
MICRO NUMBER:: 10240931
SPECIMEN QUALITY:: ADEQUATE

## 2019-11-23 NOTE — Progress Notes (Signed)
+   UTI sensitive to abx used. No further action needed

## 2019-12-06 ENCOUNTER — Other Ambulatory Visit: Payer: Self-pay | Admitting: Family Medicine

## 2019-12-06 DIAGNOSIS — F331 Major depressive disorder, recurrent, moderate: Secondary | ICD-10-CM

## 2019-12-11 ENCOUNTER — Other Ambulatory Visit (INDEPENDENT_AMBULATORY_CARE_PROVIDER_SITE_OTHER): Payer: Self-pay | Admitting: Family Medicine

## 2019-12-11 DIAGNOSIS — J4541 Moderate persistent asthma with (acute) exacerbation: Secondary | ICD-10-CM

## 2019-12-11 DIAGNOSIS — J301 Allergic rhinitis due to pollen: Secondary | ICD-10-CM

## 2019-12-16 ENCOUNTER — Encounter: Payer: Self-pay | Admitting: Family Medicine

## 2019-12-16 ENCOUNTER — Other Ambulatory Visit: Payer: Self-pay | Admitting: Neurology

## 2019-12-16 ENCOUNTER — Ambulatory Visit (INDEPENDENT_AMBULATORY_CARE_PROVIDER_SITE_OTHER): Payer: No Typology Code available for payment source | Admitting: Family Medicine

## 2019-12-16 ENCOUNTER — Other Ambulatory Visit: Payer: Self-pay

## 2019-12-16 VITALS — BP 120/78 | HR 94 | Temp 97.9°F | Resp 17 | Ht 63.0 in | Wt 195.6 lb

## 2019-12-16 DIAGNOSIS — F339 Major depressive disorder, recurrent, unspecified: Secondary | ICD-10-CM | POA: Diagnosis not present

## 2019-12-16 DIAGNOSIS — F411 Generalized anxiety disorder: Secondary | ICD-10-CM | POA: Diagnosis not present

## 2019-12-16 DIAGNOSIS — E782 Mixed hyperlipidemia: Secondary | ICD-10-CM

## 2019-12-16 DIAGNOSIS — Z0000 Encounter for general adult medical examination without abnormal findings: Secondary | ICD-10-CM

## 2019-12-16 DIAGNOSIS — R9401 Abnormal electroencephalogram [EEG]: Secondary | ICD-10-CM | POA: Diagnosis not present

## 2019-12-16 DIAGNOSIS — F5104 Psychophysiologic insomnia: Secondary | ICD-10-CM

## 2019-12-16 DIAGNOSIS — G43009 Migraine without aura, not intractable, without status migrainosus: Secondary | ICD-10-CM | POA: Diagnosis not present

## 2019-12-16 DIAGNOSIS — M818 Other osteoporosis without current pathological fracture: Secondary | ICD-10-CM

## 2019-12-16 DIAGNOSIS — E042 Nontoxic multinodular goiter: Secondary | ICD-10-CM

## 2019-12-16 DIAGNOSIS — G2581 Restless legs syndrome: Secondary | ICD-10-CM

## 2019-12-16 NOTE — Patient Instructions (Signed)
Please return in 6 months for recheck. Please schedule a lab visit for next week. Come fasting.   I will release your lab results to you on your MyChart account with further instructions. Please reply with any questions.   Hang in there and good luck with your surgery.  You are due for a colonoscopy in August of this year.   If you have any questions or concerns, please don't hesitate to send me a message via MyChart or call the office at [phone]. Thank you for visiting with [place] today! It's our pleasure caring for you.  Please do these things to maintain good health!   Exercise at least 30-45 minutes a day,  4-5 days a week.   Eat a low-fat diet with lots of fruits and vegetables, up to 7-9 servings per day.  Drink plenty of water daily. Try to drink 8 8oz glasses per day.  Seatbelts can save your life. Always wear your seatbelt.  Place Smoke Detectors on every level of your home and check batteries every year.  Schedule an appointment with an eye doctor for an eye exam every 1-2 years  Safe sex - use condoms to protect yourself from STDs if you could be exposed to these types of infections. Use birth control if you do not want to become pregnant and are sexually active.  Avoid heavy alcohol use. If you drink, keep it to less than 2 drinks/day and not every day.  [place].  Choose someone you trust that could speak for you if you became unable to speak for yourself.  Depression is common in our stressful world.If you're feeling down or losing interest in things you normally enjoy, please come in for a visit.  If anyone is threatening or hurting you, please get help. Physical or Emotional Violence is never OK.
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
## 2019-12-26 IMAGING — US US THYROID
1 series · 13 of 25 positions shown · non-contrast
Comparison: None.

CLINICAL DATA: 57-year-old female with a history of choking
sensation

EXAM:
THYROID ULTRASOUND
TECHNIQUE: Ultrasound examination of the thyroid gland and adjacent soft
tissues was performed.

[Series 1: us thyroid · 0.05mm/px · 51 acquisitions, 13 frames shown]
[im 1/51]
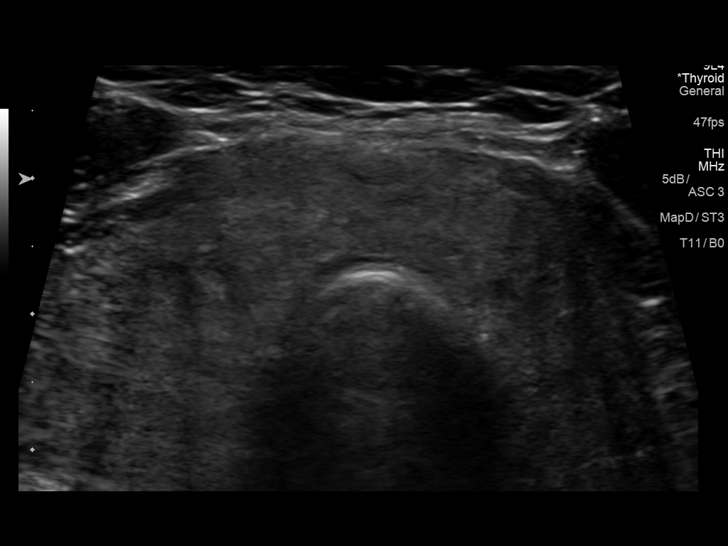
[im 5/51]
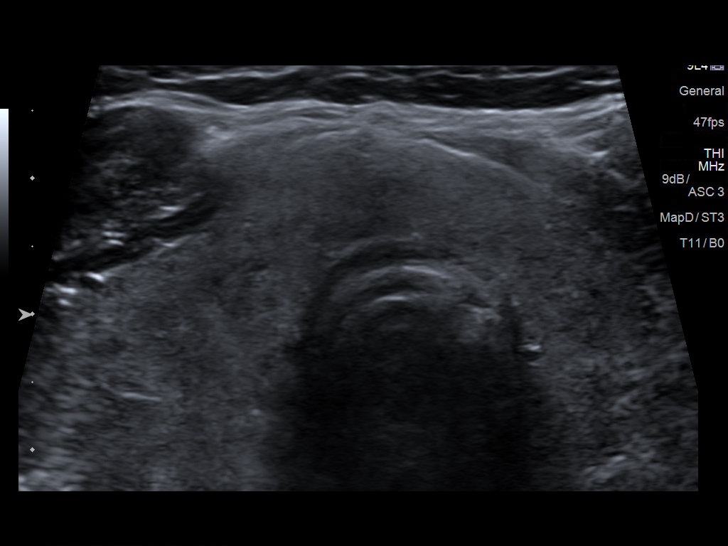
[im 9/51]
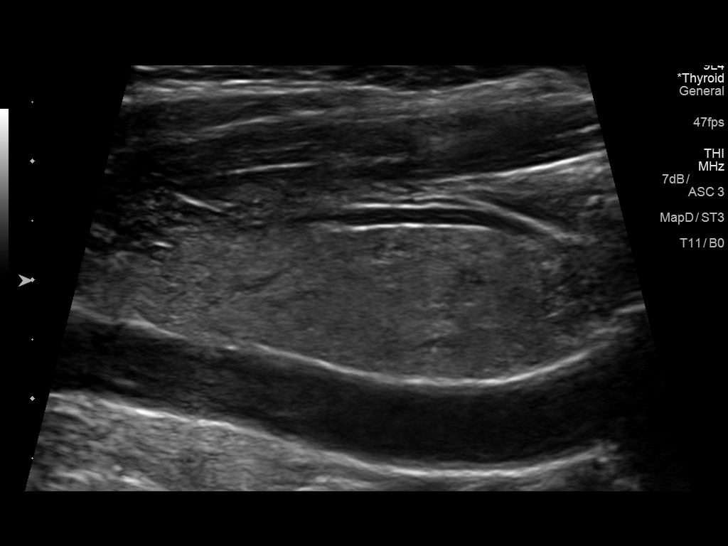
[im 13/51]
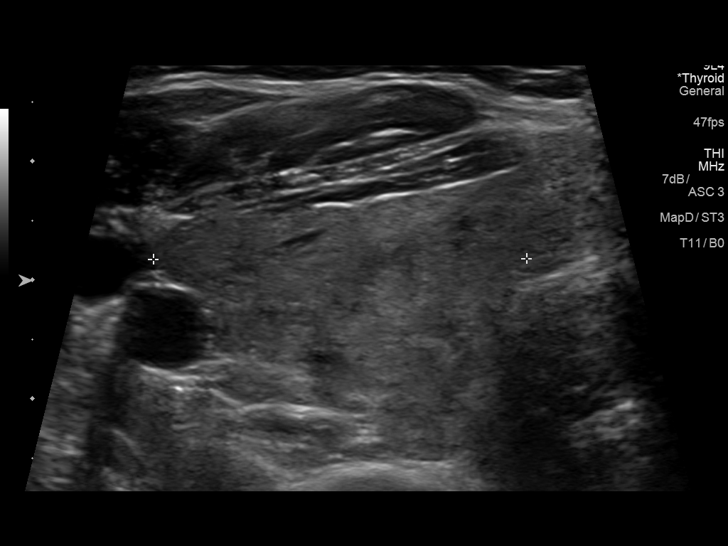
[im 17/51]
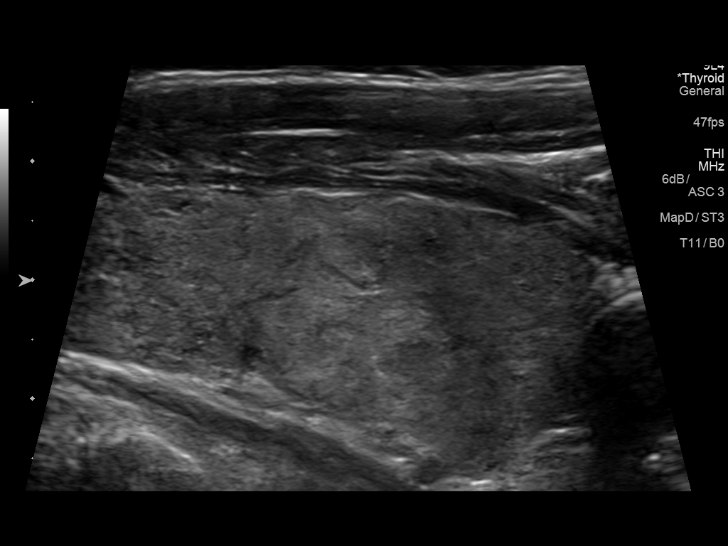
[im 21/51]
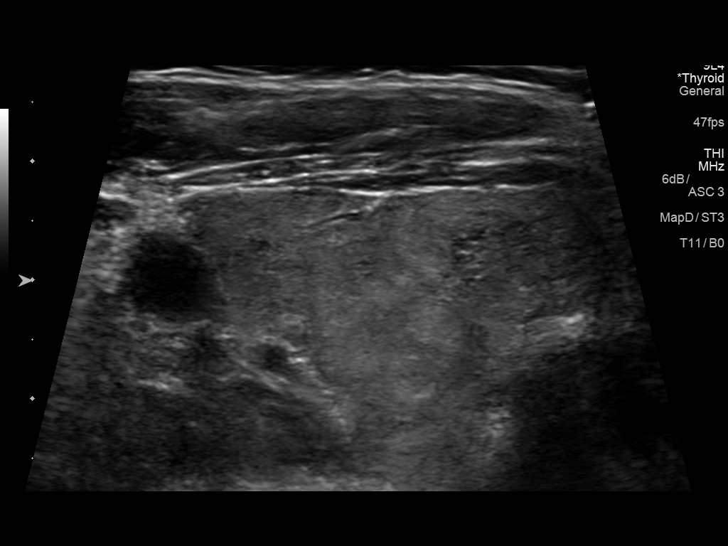
[im 26/51]
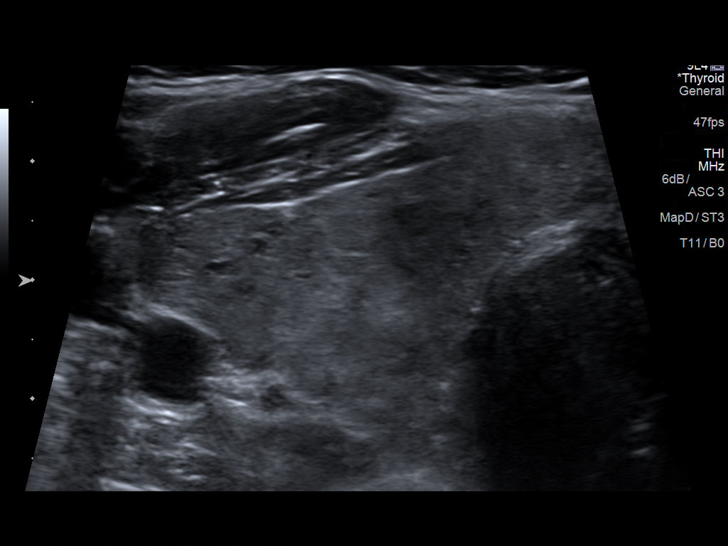
[im 30/51]
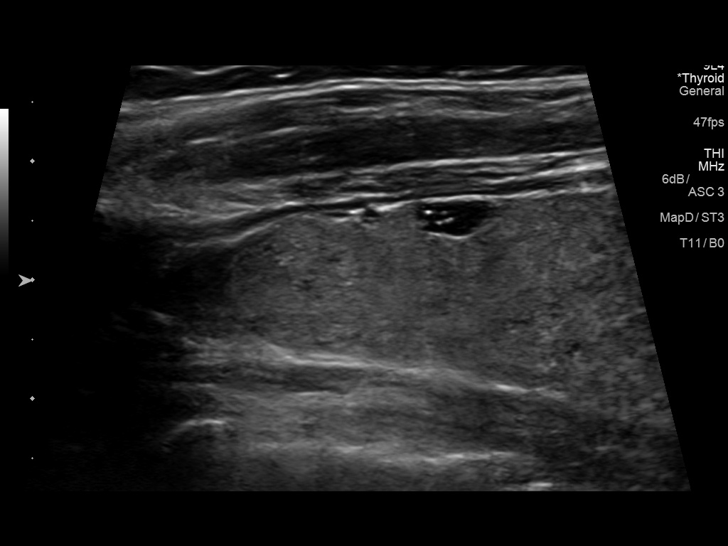
[im 34/51]
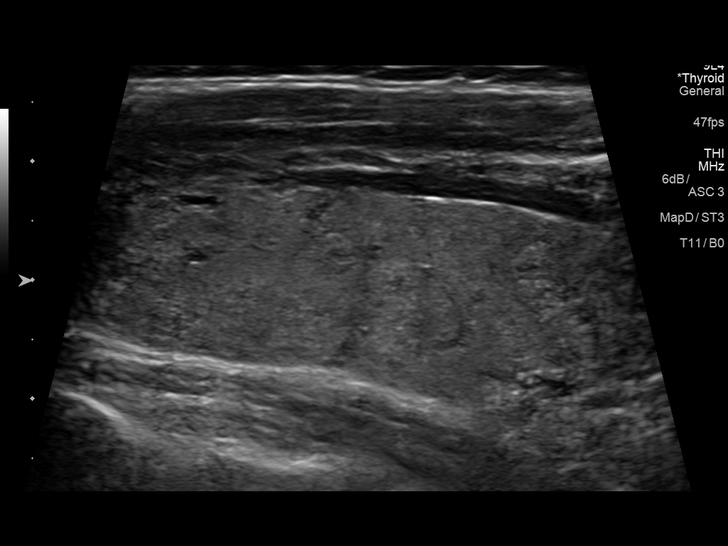
[im 38/51]
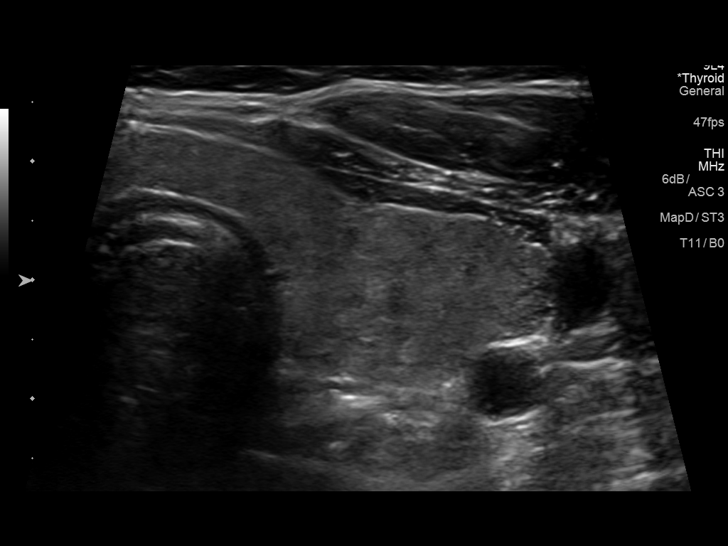
[im 42/51]
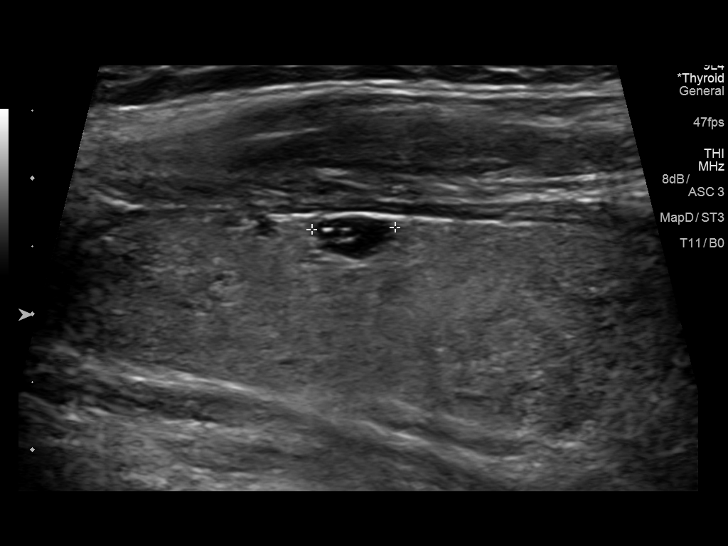
[im 46/51]
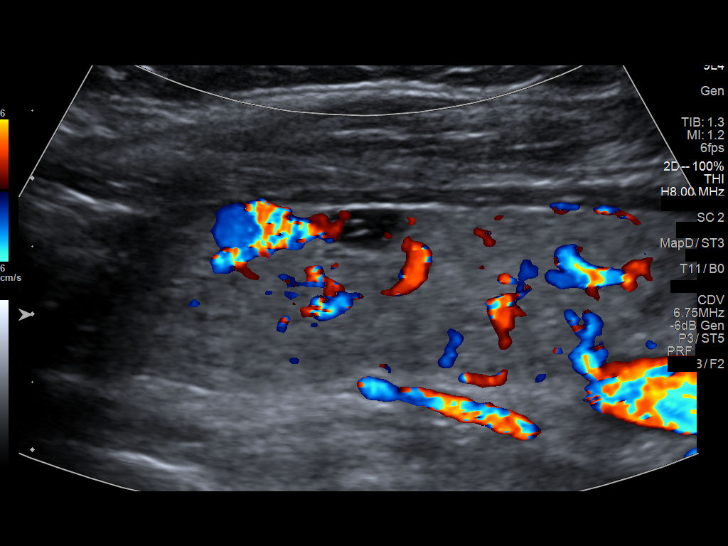
[im 51/51]
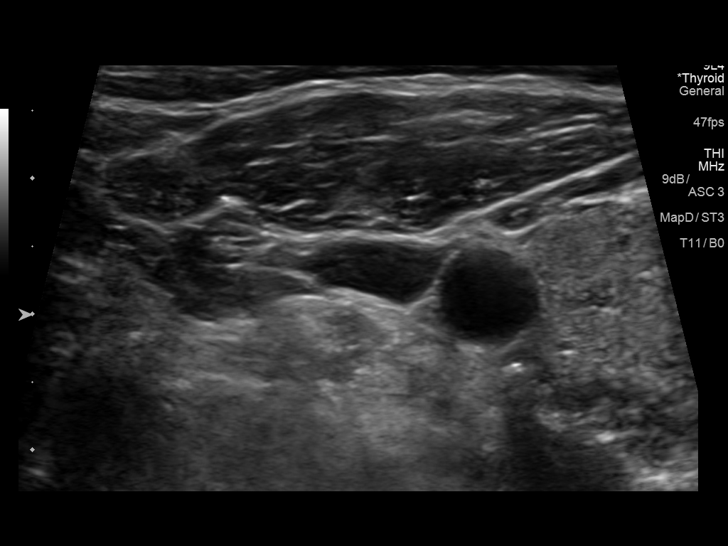

[13 of 25 positions shown; findings below may reference images not displayed]

FINDINGS: Parenchymal Echotexture: Mildly heterogenous

Isthmus: 0.8 cm

Right lobe: 5.4 cm x 2.1 cm x 3.1 cm

Left lobe: 5.6 cm x 1.8 cm x 2.3 cm

_________________________________________________________

Estimated total number of nodules >/= 1 cm: 1

Number of spongiform nodules >/=  2 cm not described below (TR1): 0

Number of mixed cystic and solid nodules >/= 1.5 cm not described
below (TR2): 0

_________________________________________________________

Nodule # 1:

Location: Right; Mid

Maximum size: 1.9 cm; Other 2 dimensions: 1.8 cm x 1.4 cm

Composition: solid/almost completely solid (2)

Echogenicity: isoechoic (1)

Shape: taller-than-wide (3)

Margins: ill-defined (0)

Echogenic foci: none (0)

ACR TI-RADS total points: 6.

ACR TI-RADS risk category: TR4 (4-6 points).

ACR TI-RADS recommendations:

Nodule meets criteria for biopsy

_________________________________________________________

Nodule # 2:

Location: Left; Mid

Maximum size: 0.7 cm; Other 2 dimensions: 0.6 cm x 0.4 cm

Composition: cystic/almost completely cystic (0)

Echogenicity: anechoic (0)

Shape: not taller-than-wide (0)

Margins: smooth (0)

Echogenic foci: large comet-tail artifacts (0)

ACR TI-RADS total points: 0.

ACR TI-RADS risk category: TR1 (0-1 points).

ACR TI-RADS recommendations:

Colloid nodule/cyst does not meet criteria for surveillance or
biopsy

_________________________________________________________

No adenopathy
IMPRESSION: Right inferior thyroid nodule (labeled 1) meets criteria for biopsy,
as designated by the newly established ACR TI-RADS criteria, and
referral for biopsy is recommended.

Recommendations follow those established by the new ACR TI-RADS
criteria ([HOSPITAL] 3318;[DATE]).

## 2020-01-09 ENCOUNTER — Other Ambulatory Visit: Payer: Self-pay | Admitting: *Deleted

## 2020-01-09 DIAGNOSIS — J301 Allergic rhinitis due to pollen: Secondary | ICD-10-CM

## 2020-01-09 DIAGNOSIS — J4541 Moderate persistent asthma with (acute) exacerbation: Secondary | ICD-10-CM

## 2020-01-09 MED ORDER — MONTELUKAST SODIUM 10 MG PO TABS: 10.0000 mg | ORAL_TABLET | Freq: Every day | ORAL | 1 refills | Status: DC

## 2020-01-14 ENCOUNTER — Other Ambulatory Visit: Payer: Self-pay

## 2020-01-14 ENCOUNTER — Other Ambulatory Visit (INDEPENDENT_AMBULATORY_CARE_PROVIDER_SITE_OTHER): Payer: No Typology Code available for payment source

## 2020-01-14 DIAGNOSIS — G2581 Restless legs syndrome: Secondary | ICD-10-CM

## 2020-01-14 DIAGNOSIS — Z0000 Encounter for general adult medical examination without abnormal findings: Secondary | ICD-10-CM

## 2020-01-14 DIAGNOSIS — F411 Generalized anxiety disorder: Secondary | ICD-10-CM | POA: Diagnosis not present

## 2020-01-14 DIAGNOSIS — Z01818 Encounter for other preprocedural examination: Secondary | ICD-10-CM | POA: Diagnosis not present

## 2020-01-14 DIAGNOSIS — E782 Mixed hyperlipidemia: Secondary | ICD-10-CM

## 2020-01-14 DIAGNOSIS — E042 Nontoxic multinodular goiter: Secondary | ICD-10-CM | POA: Diagnosis not present

## 2020-01-14 LAB — URINALYSIS, ROUTINE W REFLEX MICROSCOPIC
Hgb urine dipstick: NEGATIVE
Ketones, ur: NEGATIVE
Leukocytes,Ua: NEGATIVE
Nitrite: NEGATIVE
RBC / HPF: NONE SEEN (ref 0–?)
Specific Gravity, Urine: 1.025 (ref 1.000–1.030)
Total Protein, Urine: NEGATIVE
Urine Glucose: NEGATIVE
Urobilinogen, UA: 0.2 (ref 0.0–1.0)
pH: 6 (ref 5.0–8.0)

## 2020-01-14 LAB — CBC WITH DIFFERENTIAL/PLATELET
Basophils Absolute: 0 10*3/uL (ref 0.0–0.1)
Basophils Relative: 1.1 % (ref 0.0–3.0)
Eosinophils Absolute: 0.1 10*3/uL (ref 0.0–0.7)
Eosinophils Relative: 3.5 % (ref 0.0–5.0)
HCT: 37.2 % (ref 36.0–46.0)
Hemoglobin: 12.7 g/dL (ref 12.0–15.0)
Lymphocytes Relative: 39.2 % (ref 12.0–46.0)
Lymphs Abs: 1.5 10*3/uL (ref 0.7–4.0)
MCHC: 34 g/dL (ref 30.0–36.0)
MCV: 94.8 fl (ref 78.0–100.0)
Monocytes Absolute: 0.3 10*3/uL (ref 0.1–1.0)
Monocytes Relative: 8.9 % (ref 3.0–12.0)
Neutro Abs: 1.8 10*3/uL (ref 1.4–7.7)
Neutrophils Relative %: 47.3 % (ref 43.0–77.0)
Platelets: 218 10*3/uL (ref 150.0–400.0)
RBC: 3.92 Mil/uL (ref 3.87–5.11)
RDW: 14.7 % (ref 11.5–15.5)
WBC: 3.9 10*3/uL — ABNORMAL LOW (ref 4.0–10.5)

## 2020-01-14 LAB — LIPID PANEL
Cholesterol: 274 mg/dL — ABNORMAL HIGH (ref 0–200)
HDL: 67.1 mg/dL (ref >39.00)
LDL Cholesterol: 190 mg/dL — ABNORMAL HIGH (ref 0–99)
NonHDL: 207.36
Total CHOL/HDL Ratio: 4
Triglycerides: 86 mg/dL (ref 0.0–149.0)
VLDL: 17.2 mg/dL (ref 0.0–40.0)

## 2020-01-14 LAB — VITAMIN D 25 HYDROXY (VIT D DEFICIENCY, FRACTURES): VITD: 42.14 ng/mL (ref 30.00–100.00)

## 2020-01-14 LAB — COMPREHENSIVE METABOLIC PANEL
ALT: 32 U/L (ref 0–35)
AST: 24 U/L (ref 0–37)
Albumin: 4.2 g/dL (ref 3.5–5.2)
Alkaline Phosphatase: 43 U/L (ref 39–117)
BUN: 15 mg/dL (ref 6–23)
CO2: 27 mEq/L (ref 19–32)
Calcium: 9.4 mg/dL (ref 8.4–10.5)
Chloride: 103 mEq/L (ref 96–112)
Creatinine, Ser: 0.8 mg/dL (ref 0.40–1.20)
GFR: 73.8 mL/min (ref >60.00)
Glucose, Bld: 100 mg/dL — ABNORMAL HIGH (ref 70–99)
Potassium: 4 mEq/L (ref 3.5–5.1)
Sodium: 139 mEq/L (ref 135–145)
Total Bilirubin: 0.4 mg/dL (ref 0.2–1.2)
Total Protein: 7 g/dL (ref 6.0–8.3)

## 2020-01-14 LAB — TSH: TSH: 2.59 u[IU]/mL (ref 0.35–4.50)

## 2020-01-14 NOTE — Addendum Note (Signed)
Addended by: Christiana Fuchs on: 01/14/2020 12:03 PM   Modules accepted: Orders

## 2020-01-14 NOTE — Addendum Note (Signed)
Addended by: Doran Clay A on: 01/14/2020 12:01 PM   Modules accepted: Orders

## 2020-01-19 ENCOUNTER — Telehealth: Payer: Self-pay

## 2020-01-19 NOTE — Telephone Encounter (Signed)
Patient returning missed call. 

## 2020-01-22 MED ORDER — ATORVASTATIN CALCIUM 10 MG PO TABS
10.0000 mg | ORAL_TABLET | Freq: Every day | ORAL | 3 refills | Status: DC
Start: 2020-01-22 — End: 2020-11-03

## 2020-01-22 NOTE — Telephone Encounter (Signed)
Spoke with the patient and she is aware to pick up her new medication at her pharmacy. No other questions or concerns at this time.

## 2020-01-22 NOTE — Telephone Encounter (Signed)
Patient called back in stating that she is agreeable to starting statin. Please advise on which medication needs to be sent in and quantity.

## 2020-01-22 NOTE — Telephone Encounter (Signed)
Please order lipitor 10mg  po qhs #90 with 3 rf

## 2020-01-22 NOTE — Addendum Note (Signed)
Addended by: Thomes Cake on: 01/22/2020 04:39 PM   Modules accepted: Orders

## 2020-01-28 ENCOUNTER — Other Ambulatory Visit: Payer: Self-pay | Admitting: Neurology

## 2020-02-04 ENCOUNTER — Other Ambulatory Visit: Payer: Self-pay

## 2020-02-04 ENCOUNTER — Telehealth: Payer: Self-pay

## 2020-02-04 NOTE — Telephone Encounter (Signed)
Refill request sent to PCP.

## 2020-02-04 NOTE — Telephone Encounter (Signed)
Last refill: 10/31/19 #30, 2 Last OV: 12/16/19 dx. CPE

## 2020-02-04 NOTE — Telephone Encounter (Signed)
  LAST APPOINTMENT DATE: 01/19/2020   NEXT APPOINTMENT DATE:@Visit  date not found  MEDICATION: ALPRAZolam (XANAX) 1 MG tablet  PHARMACY: [facility] [place], [place] - [address] AT [facility] [address] Phone:  [phone]  Fax:  [phone]       **Let patient know to contact pharmacy at the end of the day to make sure medication is ready. **  ** Please notify patient to allow 48-72 hours to process**  **Encourage patient to contact the pharmacy for refills or they can request refills through [facility]**  CLINICAL FILLS OUT ALL BELOW:   LAST REFILL:  QTY:  REFILL DATE:    OTHER COMMENTS:    Okay for refill?  Please advise

## 2020-02-06 MED ORDER — ALPRAZOLAM 1 MG PO TABS: ORAL_TABLET | ORAL | 2 refills | Status: DC

## 2020-02-06 NOTE — Telephone Encounter (Signed)
Patient is checking up on this and is requesting an update and states that she is completely out.

## 2020-02-06 NOTE — Telephone Encounter (Signed)
Noted, refill request sent to PCP yesterday. Received after PCP had left for half day.

## 2020-02-18 ENCOUNTER — Telehealth: Payer: Self-pay | Admitting: Neurology

## 2020-02-18 NOTE — Telephone Encounter (Signed)
Patient called in and wanted to talk with someone about her medication. It's getting expensive and she may want to change it.

## 2020-02-18 NOTE — Telephone Encounter (Signed)
LMOVM 8:43am

## 2020-02-23 ENCOUNTER — Telehealth: Payer: Self-pay

## 2020-02-23 NOTE — Telephone Encounter (Signed)
Pt states her insurance want cover her Amiovig and the Access Card. Pt feels like the medication not working as well. Pt taking the 140mg .

## 2020-02-24 NOTE — Telephone Encounter (Signed)
Tried calling pt. Unable to LVM.

## 2020-02-24 NOTE — Telephone Encounter (Signed)
I would recommend changing to [facility], which is another monthly injection.  Would this be covered by her insurance?  Otherwise, we can discuss it at her upcoming follow up appointment.
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
## 2020-03-09 ENCOUNTER — Other Ambulatory Visit: Payer: Self-pay

## 2020-03-09 ENCOUNTER — Encounter: Payer: Self-pay | Admitting: Neurology

## 2020-03-09 ENCOUNTER — Ambulatory Visit (INDEPENDENT_AMBULATORY_CARE_PROVIDER_SITE_OTHER): Payer: No Typology Code available for payment source | Admitting: Neurology

## 2020-03-09 VITALS — BP 100/65 | HR 86 | Ht 63.0 in | Wt 202.4 lb

## 2020-03-09 DIAGNOSIS — G43709 Chronic migraine without aura, not intractable, without status migrainosus: Secondary | ICD-10-CM | POA: Diagnosis not present

## 2020-03-09 MED ORDER — NURTEC 75 MG PO TBDP: 1.0000 | ORAL_TABLET | ORAL | 5 refills | Status: DC

## 2020-03-09 MED ORDER — SUMATRIPTAN SUCCINATE 100 MG PO TABS: 100.0000 mg | ORAL_TABLET | Freq: Once | ORAL | 5 refills | Status: DC | PRN

## 2020-03-09 MED ORDER — PROMETHAZINE HCL 50 MG PO TABS: 50.0000 mg | ORAL_TABLET | Freq: Four times a day (QID) | ORAL | 5 refills | Status: DC | PRN

## 2020-03-09 NOTE — Patient Instructions (Signed)
1.  Stop Aimovig.  Instead, start Nurtec 1 tablet every other day. 2.  Continue sumatriptan as needed.   3. Limit use of pain relievers to no more than 2 days out of week to prevent risk of rebound or medication-overuse headache. 4.  Keep headache diary

## 2020-03-14 ENCOUNTER — Other Ambulatory Visit: Payer: Self-pay | Admitting: Family Medicine

## 2020-03-14 DIAGNOSIS — F331 Major depressive disorder, recurrent, moderate: Secondary | ICD-10-CM

## 2020-05-11 ENCOUNTER — Encounter: Payer: Self-pay | Admitting: Neurology

## 2020-05-11 ENCOUNTER — Other Ambulatory Visit: Payer: Self-pay | Admitting: Family Medicine

## 2020-05-11 NOTE — Telephone Encounter (Signed)
LR: 02-06-2020 Qty: 30 with 2 refills  Last office visit: 4-6-201 Upcoming appointment: No pending appt

## 2020-05-11 NOTE — Progress Notes (Addendum)
Clearence Cheek (Key: [id number]) Nurtec 75MG  dispersible tablets   Form [facility] Electronic Prior Authorization Form ([employer]) 2017 NCPDP Created 21 hours ago Sent to Plan 21 hours ago Plan Response 21 hours ago Submit Clinical Questions 21 hours ago Determination Favorable 8 hours ago Message from Plan Approved. Approved for NURTEC Tablet Disintegrating, quantity up to 16 per 30 days, under the pharmacy benefit. The drug has been approved from 05/11/2020 to 11/08/2020. Generic substitution required when available.

## 2020-06-13 ENCOUNTER — Other Ambulatory Visit: Payer: Self-pay | Admitting: Neurology

## 2020-06-13 ENCOUNTER — Other Ambulatory Visit: Payer: Self-pay | Admitting: Family Medicine

## 2020-06-13 DIAGNOSIS — J301 Allergic rhinitis due to pollen: Secondary | ICD-10-CM

## 2020-06-13 DIAGNOSIS — J4541 Moderate persistent asthma with (acute) exacerbation: Secondary | ICD-10-CM

## 2020-06-16 ENCOUNTER — Other Ambulatory Visit: Payer: Self-pay | Admitting: Family Medicine

## 2020-06-16 DIAGNOSIS — J4541 Moderate persistent asthma with (acute) exacerbation: Secondary | ICD-10-CM

## 2020-06-16 DIAGNOSIS — J301 Allergic rhinitis due to pollen: Secondary | ICD-10-CM

## 2020-07-02 ENCOUNTER — Telehealth: Payer: Self-pay | Admitting: Neurology

## 2020-07-02 NOTE — Telephone Encounter (Signed)
If she doesn't want to continue, she may stop.  We can discuss alternatives at her follow up next month

## 2020-07-02 NOTE — Telephone Encounter (Signed)
Pt advised of Dr.Jaffe note. 

## 2020-07-02 NOTE — Telephone Encounter (Signed)
Patient left message with after hours wanting to speak to Dr Tomi Likens. She said the nurtec isn't working and she wants to know if she can stop taking it. Please call.

## 2020-07-07 ENCOUNTER — Other Ambulatory Visit: Payer: Self-pay | Admitting: Neurology

## 2020-07-07 ENCOUNTER — Other Ambulatory Visit: Payer: Self-pay | Admitting: Family Medicine

## 2020-07-07 DIAGNOSIS — G8929 Other chronic pain: Secondary | ICD-10-CM

## 2020-07-07 DIAGNOSIS — M545 Low back pain, unspecified: Secondary | ICD-10-CM

## 2020-07-07 DIAGNOSIS — F331 Major depressive disorder, recurrent, moderate: Secondary | ICD-10-CM

## 2020-07-09 ENCOUNTER — Encounter: Payer: Self-pay | Admitting: Family Medicine

## 2020-07-09 ENCOUNTER — Ambulatory Visit (INDEPENDENT_AMBULATORY_CARE_PROVIDER_SITE_OTHER): Payer: PRIVATE HEALTH INSURANCE | Admitting: Family Medicine

## 2020-07-09 ENCOUNTER — Other Ambulatory Visit: Payer: Self-pay

## 2020-07-09 VITALS — BP 112/70 | HR 99 | Temp 98.2°F | Wt 198.0 lb

## 2020-07-09 DIAGNOSIS — Z1231 Encounter for screening mammogram for malignant neoplasm of breast: Secondary | ICD-10-CM

## 2020-07-09 DIAGNOSIS — L219 Seborrheic dermatitis, unspecified: Secondary | ICD-10-CM

## 2020-07-09 DIAGNOSIS — Z1212 Encounter for screening for malignant neoplasm of rectum: Secondary | ICD-10-CM

## 2020-07-09 DIAGNOSIS — L719 Rosacea, unspecified: Secondary | ICD-10-CM

## 2020-07-09 DIAGNOSIS — Z1211 Encounter for screening for malignant neoplasm of colon: Secondary | ICD-10-CM | POA: Diagnosis not present

## 2020-07-09 MED ORDER — TRIAMCINOLONE ACETONIDE 0.1 % EX CREA: 1.0000 "application " | TOPICAL_CREAM | Freq: Two times a day (BID) | CUTANEOUS | 0 refills | Status: DC

## 2020-07-09 MED ORDER — KETOCONAZOLE 2 % EX CREA: 1.0000 "application " | TOPICAL_CREAM | Freq: Two times a day (BID) | CUTANEOUS | 2 refills | Status: AC | PRN

## 2020-07-09 MED ORDER — METRONIDAZOLE 0.75 % EX CREA: TOPICAL_CREAM | Freq: Two times a day (BID) | CUTANEOUS | 0 refills | Status: AC

## 2020-07-09 NOTE — Patient Instructions (Signed)
Please return in April for your complete physical.   Use the ketoconazole on your eyebrows and nasal folds for flaking and redness. Use the steroid cream in the same areas. Use the metrocreme on your cheeks and chin.    I have ordered a referral to [facility]; if you find the name of the female GI you'd like to see, let me know. I do not see your message.   I have ordered a mammogram and/or bone density for you as we discussed today: [x]   Mammogram  []   Bone Density  Please call the office checked below to schedule your appointment: Your appointment will at the following location  [x]   [facility]      [place], [place]         []   [facility]  [place], [place]  If you have any questions or concerns, please don't hesitate to send me a message via MyChart or call the office at [phone]. Thank you for visiting with [place] today! It's our pleasure caring for you.  Seborrheic Dermatitis, Adult Seborrheic dermatitis is a skin disease that causes red, scaly patches. It usually occurs on the scalp, and it is often called dandruff. The patches may appear on other parts of the body. Skin patches tend to appear where there are many oil glands in the skin. Areas of the body that are commonly affected include:  Scalp.  Skin folds of the body.  Ears.  Eyebrows.  Neck.  Face.  Armpits.  The bearded area of men's faces. The condition may come and go for no known reason, and it is often long-lasting (chronic). What are the causes? The cause of this condition is not known. What increases the risk? This condition is more likely to develop in people who:  Have certain conditions, such as: ? HIV (human immunodeficiency virus). ? AIDS (acquired immunodeficiency syndrome). ? Parkinson disease. ? Mood disorders, such as depression.  Are 84-74 years old. What are the signs  or symptoms? Symptoms of this condition include:  Thick scales on the scalp.  Redness on the face or in the armpits.  Skin that is flaky. The flakes may be white or yellow.  Skin that seems oily or dry but is not helped with moisturizers.  Itching or burning in the affected areas. How is this diagnosed? This condition is diagnosed with a medical history and physical exam. A sample of your skin may be tested (skin biopsy). You may need to see a skin specialist (dermatologist). How is this treated? There is no cure for this condition, but treatment can help to manage the symptoms. You may get treatment to remove scales, lower the risk of skin infection, and reduce swelling or itching. Treatment may include:  Creams that reduce swelling and irritation (steroids).  Creams that reduce skin yeast.  Medicated shampoo, soaps, moisturizing creams, or ointments.  Medicated moisturizing creams or ointments. Follow these instructions at home:  Apply over-the-counter and prescription medicines only as told by your health care provider.  Use any medicated shampoo, soaps, skin creams, or ointments only as told by your health care provider.  Keep all follow-up visits as told by your health care provider. This is important. Contact a health care provider if:  Your symptoms do not improve with treatment.  Your symptoms get worse.  You  have new symptoms. This information is not intended to replace advice given to you by your health care provider. Make sure you discuss any questions you have with your health care provider. Document Revised: 08/10/2017 Document Reviewed: 12/16/2015 Elsevier Patient Education  [place] is a long-term (chronic) condition that affects the skin of the face, including the cheeks, nose, forehead, and chin. This condition can also affect the eyes. Rosacea causes blood vessels near the surface of the skin to enlarge, which results in  redness. What are the causes? The cause of this condition is not known. Certain triggers can make rosacea worse, including: Hot baths. Exercise. Sunlight. Very hot or cold temperatures. Hot or spicy foods and drinks. Drinking alcohol. Stress. Taking blood pressure medicine. Long-term use of topical steroids on the face. What increases the risk? You are more likely to develop this condition if you: Are older than 58 years of age. Are a woman. Have light-colored skin (light complexion). Have a family history of rosacea. What are the signs or symptoms? Symptoms of this condition include: Redness of the face. Red bumps or pimples on the face. A red, enlarged nose. Blushing easily. Red lines on the skin. Irritated, burning, or itchy feeling in the eyes. Swollen eyelids. Drainage from the eyes. Feeling like there is something in your eye. How is this diagnosed? This condition is diagnosed with a medical history and physical exam. How is this treated? There is no cure for this condition, but treatment can help to control your symptoms. Your health care provider may recommend that you see a skin specialist (dermatologist). Treatment may include: Medicines that are applied to the skin or taken by mouth (orally). This can include antibiotic medicines. Laser treatment to improve the appearance of the skin. Surgery. This is rare. Your health care provider will also recommend the best way to take care of your skin. Even after your skin improves, you will likely need to continue treatment to prevent your rosacea from coming back. Follow these instructions at home: Skin care Take care of your skin as told by your health care provider. You may be told to do these things: Wash your skin gently two or more times each day. Use mild soap. Use a sunscreen or sunblock with SPF 30 or greater. Use gentle cosmetics that are meant for sensitive skin. Shave with an electric shaver instead of a  blade. Lifestyle Try to keep track of what foods trigger this condition. Avoid any triggers. These may include: Spicy foods. Seafood. Cheese. Hot liquids. Nuts. Chocolate. Iodized salt. Do not drink alcohol. Avoid extremely cold or hot temperatures. Try to reduce your stress. If you need help, talk with your health care provider. When you exercise, do these things to stay cool: Limit sun exposure to your face. Use a fan. Do shorter and more frequent intervals of exercise. General instructions Take and apply over-the-counter and prescription medicines only as told by your health care provider. If you were prescribed an antibiotic medicine, apply it or take it as told by your health care provider. Do not stop using the antibiotic even if your condition improves. If your eyelids are affected, apply warm compresses to them. Do this as told by your health care provider. Keep all follow-up visits as told by your health care provider. This is important. Contact a health care provider if: Your symptoms get worse. Your symptoms do not improve after 2 months of treatment. You have new symptoms. You have any  changes in vision or you have problems with your eyes, such as redness or itching. You feel depressed. You lose your appetite. You have trouble concentrating. Summary Rosacea is a long-term (chronic) condition that affects the skin of the face, including the cheeks, nose, forehead, and chin. Take care of your skin as told by your health care provider. Take and apply over-the-counter and prescription medicines only as told by your health care provider. Contact a health care provider if your symptoms get worse or if you have any changes in vision or other problems with your eyes, such as redness or itching. Keep all follow-up visits as told by your health care provider. This is important. This information is not intended to replace advice given to you by your health care provider. Make  sure you discuss any questions you have with your health care provider. Document Revised: 01/30/2018 Document Reviewed: 01/30/2018 Elsevier Patient Education  2020 [employer].

## 2020-07-10 ENCOUNTER — Other Ambulatory Visit: Payer: Self-pay | Admitting: Family Medicine

## 2020-07-10 DIAGNOSIS — F331 Major depressive disorder, recurrent, moderate: Secondary | ICD-10-CM
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
## 2020-08-03 ENCOUNTER — Other Ambulatory Visit: Payer: Self-pay

## 2020-08-03 ENCOUNTER — Encounter: Payer: Self-pay | Admitting: Neurology

## 2020-08-03 ENCOUNTER — Other Ambulatory Visit: Payer: Self-pay | Admitting: Neurology

## 2020-08-03 ENCOUNTER — Ambulatory Visit (INDEPENDENT_AMBULATORY_CARE_PROVIDER_SITE_OTHER): Payer: PRIVATE HEALTH INSURANCE | Admitting: Neurology

## 2020-08-03 DIAGNOSIS — M545 Low back pain, unspecified: Secondary | ICD-10-CM

## 2020-08-03 DIAGNOSIS — G8929 Other chronic pain: Secondary | ICD-10-CM

## 2020-08-03 MED ORDER — CYCLOBENZAPRINE HCL 10 MG PO TABS: ORAL_TABLET | ORAL | 1 refills | Status: DC

## 2020-08-03 MED ORDER — QULIPTA 60 MG PO TABS: 60.0000 mg | ORAL_TABLET | Freq: Every day | ORAL | 5 refills | Status: DC

## 2020-08-03 NOTE — Patient Instructions (Signed)
1.  Will start Qulipta 60mg  daily 2.  Refilled cyclobenzaprine 3.  Limit use of pain relievers to no more than 2 days out of week to prevent risk of rebound or medication-overuse headache. 4.  Keep headache diary 5.  Follow up 4 to 6 months.

## 2020-08-11 ENCOUNTER — Other Ambulatory Visit: Payer: Self-pay | Admitting: Neurology

## 2020-08-16 ENCOUNTER — Telehealth: Payer: Self-pay | Admitting: Neurology

## 2020-08-16 NOTE — Telephone Encounter (Signed)
Patient called to see if her [name] has received a prior authorization yet.

## 2020-08-18 NOTE — Telephone Encounter (Signed)
I will put 2 bx up front for her

## 2020-08-18 NOTE — Telephone Encounter (Signed)
I called patient to let her know I had started the PA for the [facility]. It is pending at this time. She said she has 1 pill left and wanted to know if she can get any samples in the mean time to take until we hear back from insurance regarding PA. Thanks!

## 2020-08-19 ENCOUNTER — Encounter: Payer: Self-pay | Admitting: Neurology

## 2020-08-19 NOTE — Progress Notes (Signed)
Clearence Cheek Key: [id number] - PA Case ID: [id number] Need help? Call us at [phone] Outcome Approvedon December 8 Approved. Approved for QULIPTA Tablet, quantity up to 30 per 30 days, under the pharmacy benefit. The drug has been approved from 08/18/2020 to 08/18/2021. Generic substitution required when available. Drug Qulipta 60MG  tablets Form [facility] Electronic Prior Authorization Form ([employer]) 2017 NCPDP

## 2020-08-27 ENCOUNTER — Telehealth: Payer: Self-pay

## 2020-08-27 MED ORDER — QULIPTA 60 MG PO TABS: 60.0000 mg | ORAL_TABLET | Freq: Every day | ORAL | 5 refills | Status: DC

## 2020-08-27 NOTE — Telephone Encounter (Signed)
Thank you :)

## 2020-08-27 NOTE — Telephone Encounter (Signed)
Pt left a message, please give her call in regards to her Qulitpa,.   Telephone call to pt, Pt wanted to know if we could see why her script not at the [facility].   Advised pt unforunetly the script was set to Print and not Send.     I will put on bx of Qulipta at the front desk and resend script from 11.23 to pharmacy. Please give [place] a call you continue to have any problems.     chelsea do you know if this has been approved.

## 2020-08-29 ENCOUNTER — Other Ambulatory Visit: Payer: Self-pay | Admitting: Family Medicine

## 2020-08-30 NOTE — Telephone Encounter (Signed)
Last refill: 05/12/20 #30,3  Last OV: 07/09/20 dx. Eczema

## 2020-08-31 ENCOUNTER — Telehealth: Payer: Self-pay | Admitting: Neurology

## 2020-08-31 NOTE — Telephone Encounter (Signed)
Patient is waiting on Rx to come in at pharmacy,If sample available, I will call her again.patient advised.

## 2020-08-31 NOTE — Telephone Encounter (Signed)
Patient called and said she still has not received her [name] and now she is about out. Patient is requesting another sample, if possible.

## 2020-09-08 MED ORDER — SUMATRIPTAN SUCCINATE 100 MG PO TABS: 100.0000 mg | ORAL_TABLET | Freq: Once | ORAL | 5 refills | Status: DC | PRN

## 2020-09-08 NOTE — Telephone Encounter (Signed)
New script sent in

## 2020-09-28 ENCOUNTER — Other Ambulatory Visit: Payer: Self-pay

## 2020-09-28 ENCOUNTER — Ambulatory Visit (AMBULATORY_SURGERY_CENTER): Payer: PRIVATE HEALTH INSURANCE

## 2020-09-28 VITALS — Ht 62.0 in | Wt 200.0 lb

## 2020-09-28 DIAGNOSIS — Z1211 Encounter for screening for malignant neoplasm of colon: Secondary | ICD-10-CM

## 2020-09-28 MED ORDER — NA SULFATE-K SULFATE-MG SULF 17.5-3.13-1.6 GM/177ML PO SOLN: 1.0000 | Freq: Once | ORAL | 0 refills | Status: AC

## 2020-09-28 NOTE — Progress Notes (Signed)
Pre visit completed via phone;  Patient verified name, DOB, and address;  Patient No egg or soy allergy known to patient  No issues with past sedation with any surgeries or procedures INTUBATION problems in the past - small airway per last procedure No FH of Malignant Hyperthermia No diet pills per patient No home 02 use per patient  No blood thinners per patient  Pt denies issues with constipation  No A fib or A flutter  EMMI video via [place] 19 guidelines implemented in PV today with Pt and RN  Pt is fully vaccinated  for Covid  Due to the COVID-19 pandemic we are asking patients to follow certain guidelines.  Pt aware of COVID protocols and [facility] guidelines

## 2020-09-30 ENCOUNTER — Other Ambulatory Visit: Payer: Self-pay | Admitting: Neurology

## 2020-10-18 ENCOUNTER — Telehealth: Payer: Self-pay | Admitting: Gastroenterology

## 2020-10-18 NOTE — Telephone Encounter (Signed)
Called and let pt know we should reschedule procedure just to be cautious because she has not received her test results yet. Pt agrees to reschedule. She was rescheduled to 10/27/20 at 0930.
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
## 2020-10-18 NOTE — Telephone Encounter (Signed)
Better to be cautious and reschedule. Unless she gets the results back today before she would need to start the bowel prep. Thanks.

## 2020-10-18 NOTE — Telephone Encounter (Signed)
Pt is scheduled for procedure tomorrow. She called stating that she was exposed to Covid last Tuesday. Pt had two rapid tests and they were negative. She had a PCR test yesterday but had not received results yet. Pt states that she only has a mild sore throat. She wants to know if he needs to r/s. Pls call her.

## 2020-10-19 ENCOUNTER — Encounter: Payer: PRIVATE HEALTH INSURANCE | Admitting: Gastroenterology

## 2020-10-27 ENCOUNTER — Ambulatory Visit (AMBULATORY_SURGERY_CENTER): Payer: PRIVATE HEALTH INSURANCE | Admitting: Gastroenterology

## 2020-10-27 ENCOUNTER — Other Ambulatory Visit: Payer: Self-pay

## 2020-10-27 ENCOUNTER — Encounter: Payer: Self-pay | Admitting: Gastroenterology

## 2020-10-27 VITALS — BP 126/83 | HR 77 | Temp 98.0°F | Resp 18 | Ht 62.0 in | Wt 200.0 lb

## 2020-10-27 DIAGNOSIS — Z1211 Encounter for screening for malignant neoplasm of colon: Secondary | ICD-10-CM | POA: Diagnosis present

## 2020-10-27 MED ORDER — SODIUM CHLORIDE 0.9 % IV SOLN: 500.0000 mL | Freq: Once | INTRAVENOUS | Status: DC

## 2020-10-27 NOTE — Op Note (Signed)
[facility] Patient Name: Kathryn [name] Procedure Date: 10/27/2020 10:04 AM MRN: [id number] Endoscopist: Thornton Park MD, MD Age: 60 Referring MD:  Date of Birth: 05-Jun-1961 Gender: Female Account #: [id number] Procedure:                Colonoscopy Indications:              Screening for colorectal malignant neoplasm                           Prior colonoscopy with Dr. Paulita Fujita                           No known family history of colon cancer or polyps Medicines:                Monitored Anesthesia Care Procedure:                Pre-Anesthesia Assessment:                           - Prior to the procedure, a History and Physical                            was performed, and patient medications and                            allergies were reviewed. The patient's tolerance of                            previous anesthesia was also reviewed. The risks                            and benefits of the procedure and the sedation                            options and risks were discussed with the patient.                            All questions were answered, and informed consent                            was obtained. Prior Anticoagulants: The patient has                            taken no previous anticoagulant or antiplatelet                            agents. ASA Grade Assessment: II - A patient with                            mild systemic disease. After reviewing the risks                            and benefits, the patient was deemed in  satisfactory condition to undergo the procedure.                           After obtaining informed consent, the colonoscope                            was passed under direct vision. Throughout the                            procedure, the patient's blood pressure, pulse, and                            oxygen saturations were monitored continuously. The                            Olympus CF-HQ190L  [id number]) Colonoscope was                            introduced through the anus and advanced to the 3                            cm into the ileum. The colonoscopy was performed                            without difficulty. The patient tolerated the                            procedure well. The quality of the bowel                            preparation was excellent. The terminal ileum,                            ileocecal valve, appendiceal orifice, and rectum                            were photographed. Scope In: 10:08:19 AM Scope Out: 10:18:50 AM Scope Withdrawal Time: 0 hours 0 minutes 4 seconds  Total Procedure Duration: 0 hours 10 minutes 31 seconds  Findings:                 The perianal and digital rectal examinations were                            normal.                           The entire examined colon appeared normal on direct                            and retroflexion views. Complications:            No immediate complications. Estimated Blood Loss:     Estimated blood loss: none. Impression:               - The entire examined colon is normal on direct and  retroflexion views.                           - No specimens collected. Recommendation:           - Patient has a contact number available for                            emergencies. The signs and symptoms of potential                            delayed complications were discussed with the                            patient. Return to normal activities tomorrow.                            Written discharge instructions were provided to the                            patient.                           - Resume previous diet.                           - Continue present medications.                           - Repeat colonoscopy in 10 years for surveillance,                            earlier with any new symptoms.                           - Emerging evidence supports eating a diet  of                            fruits, vegetables, grains, calcium, and yogurt                            while reducing red meat and alcohol may reduce the                            risk of colon cancer.                           - Thank you for allowing me to be involved in your                            colon cancer prevention. Thornton Park MD, MD 10/27/2020 10:22:02 AM This report has been signed electronically.

## 2020-10-27 NOTE — Progress Notes (Signed)
Pt. Anxiety level was elevated upon entering room,she had numbing spray fot I.V. insertion but did not use,talked with pt. And told her everything was doing prior to insertion and she tolerated insertion of I.v. without difficulty. Pt. Was having conversation and talking with me at the end of admitting process.

## 2020-10-27 NOTE — Progress Notes (Signed)
Report to PACU, RN, vss, BBS= Clear.  

## 2020-10-27 NOTE — Patient Instructions (Signed)
Please read handouts provided. Continue present medications.   YOU HAD AN ENDOSCOPIC PROCEDURE TODAY AT THE [facility]:   Refer to the procedure report that was given to you for any specific questions about what was found during the examination.  If the procedure report does not answer your questions, please call your gastroenterologist to clarify.  If you requested that your care partner not be given the details of your procedure findings, then the procedure report has been included in a sealed envelope for you to review at your convenience later.  YOU SHOULD EXPECT: Some feelings of bloating in the abdomen. Passage of more gas than usual.  Walking can help get rid of the air that was put into your GI tract during the procedure and reduce the bloating. If you had a lower endoscopy (such as a colonoscopy or flexible sigmoidoscopy) you may notice spotting of blood in your stool or on the toilet paper. If you underwent a bowel prep for your procedure, you may not have a normal bowel movement for a few days.  Please Note:  You might notice some irritation and congestion in your nose or some drainage.  This is from the oxygen used during your procedure.  There is no need for concern and it should clear up in a day or so.  SYMPTOMS TO REPORT IMMEDIATELY:  Following lower endoscopy (colonoscopy or flexible sigmoidoscopy):  Excessive amounts of blood in the stool  Significant tenderness or worsening of abdominal pains  Swelling of the abdomen that is new, acute  Fever of 100F or higher   For urgent or emergent issues, a gastroenterologist can be reached at any hour by calling [phone]. Do not use MyChart messaging for urgent concerns.    DIET:  We do recommend a small meal at first, but then you may proceed to your regular diet.  Drink plenty of fluids but you should avoid alcoholic beverages for 24 hours.  ACTIVITY:  You should plan to take it easy for the rest of today and  you should NOT DRIVE or use heavy machinery until tomorrow (because of the sedation medicines used during the test).    FOLLOW UP: Our staff will call the number listed on your records 48-72 hours following your procedure to check on you and address any questions or concerns that you may have regarding the information given to you following your procedure. If we do not reach you, we will leave a message.  We will attempt to reach you two times.  During this call, we will ask if you have developed any symptoms of COVID 19. If you develop any symptoms (ie: fever, flu-like symptoms, shortness of breath, cough etc.) before then, please call [phone].  If you test positive for Covid 19 in the 2 weeks post procedure, please call and report this information to us.    If any biopsies were taken you will be contacted by phone or by letter within the next 1-3 weeks.  Please call us at [phone] if you have not heard about the biopsies in 3 weeks.    SIGNATURES/CONFIDENTIALITY: You and/or your care partner have signed paperwork which will be entered into your electronic medical record.  These signatures attest to the fact that that the information above on your After Visit Summary has been reviewed and is understood.  Full responsibility of the confidentiality of this discharge information lies with you and/or your care-partner.  

## 2020-10-29 ENCOUNTER — Telehealth: Payer: Self-pay

## 2020-10-29 NOTE — Telephone Encounter (Signed)
Covid-19 screening questions   Do you now or have you had a fever in the last 14 days? No.  Do you have any respiratory symptoms of shortness of breath or cough now or in the last 14 days? No.  Do you have any family members or close contacts with diagnosed or suspected Covid-19 in the past 14 days? No.  Have you been tested for Covid-19 and found to be positive? No.       Follow up Call-  Call back number 10/27/2020  Post procedure Call Back phone  # [phone]  Permission to leave phone message Yes  Some recent data might be hidden     Patient questions:  Do you have a fever, pain , or abdominal swelling? No. Pain Score  0 *  Have you tolerated food without any problems? Yes.    Have you been able to return to your normal activities? Yes.    Do you have any questions about your discharge instructions: Diet   No. Medications  No. Follow up visit  No.  Do you have questions or concerns about your Care? No.  Actions: * If pain score is 4 or above: No action needed, pain <4.

## 2020-11-03 ENCOUNTER — Other Ambulatory Visit: Payer: Self-pay | Admitting: Family Medicine

## 2020-11-05 ENCOUNTER — Encounter: Payer: Self-pay | Admitting: Family Medicine

## 2020-12-13 ENCOUNTER — Other Ambulatory Visit: Payer: Self-pay | Admitting: Family Medicine

## 2020-12-13 NOTE — Telephone Encounter (Signed)
Last refill: 08/31/20 #30, 2 Last OV: 07/09/20 dx. 6 month f/u

## 2020-12-16 ENCOUNTER — Encounter: Payer: Self-pay | Admitting: Family Medicine

## 2020-12-18 ENCOUNTER — Other Ambulatory Visit: Payer: Self-pay

## 2020-12-18 DIAGNOSIS — L989 Disorder of the skin and subcutaneous tissue, unspecified: Secondary | ICD-10-CM

## 2020-12-18 NOTE — Telephone Encounter (Signed)
Can reply and refer to derm.

## 2020-12-20 ENCOUNTER — Ambulatory Visit: Payer: PRIVATE HEALTH INSURANCE | Admitting: Family Medicine

## 2021-01-05 ENCOUNTER — Encounter: Payer: Self-pay | Admitting: Family Medicine

## 2021-01-12 ENCOUNTER — Telehealth: Payer: Self-pay

## 2021-01-12 NOTE — Telephone Encounter (Signed)
Spoke to the patient and let her know [facility] does not take [employer] so I advised her to contact insurance to so who they are in network with and call me back with that information-patient has agreed to do it

## 2021-01-13 ENCOUNTER — Other Ambulatory Visit: Payer: Self-pay | Admitting: Family Medicine

## 2021-01-14 NOTE — Telephone Encounter (Signed)
Please call patient. Was due for cpe in April. Needs to schedule.  Declined refill on xanax. Was just filled 01/10/2021. OV if needs to discuss. Thanks

## 2021-01-19 ENCOUNTER — Encounter: Payer: Self-pay | Admitting: Family Medicine

## 2021-01-19 ENCOUNTER — Other Ambulatory Visit: Payer: Self-pay

## 2021-01-19 ENCOUNTER — Ambulatory Visit: Payer: No Typology Code available for payment source | Admitting: Family Medicine

## 2021-01-19 VITALS — BP 118/80 | HR 77 | Temp 98.0°F | Ht 62.0 in | Wt 189.0 lb

## 2021-01-19 DIAGNOSIS — J4520 Mild intermittent asthma, uncomplicated: Secondary | ICD-10-CM

## 2021-01-19 DIAGNOSIS — M940 Chondrocostal junction syndrome [Tietze]: Secondary | ICD-10-CM

## 2021-01-19 DIAGNOSIS — F4310 Post-traumatic stress disorder, unspecified: Secondary | ICD-10-CM

## 2021-01-19 DIAGNOSIS — F5104 Psychophysiologic insomnia: Secondary | ICD-10-CM

## 2021-01-19 MED ORDER — ALBUTEROL SULFATE HFA 108 (90 BASE) MCG/ACT IN AERS: 2.0000 | INHALATION_SPRAY | RESPIRATORY_TRACT | 2 refills | Status: DC | PRN

## 2021-01-19 MED ORDER — ALPRAZOLAM 1 MG PO TABS
1.0000 mg | ORAL_TABLET | Freq: Every day | ORAL | 5 refills | Status: DC
Start: 2021-01-19 — End: 2021-08-10

## 2021-01-19 NOTE — Patient Instructions (Signed)
Please return in 3 months for your annual complete physical; please come fasting.  Have a safe trip!   If you have any questions or concerns, please don't hesitate to send me a message via MyChart or call the office at [phone]. Thank you for visiting with [place] today! It's our pleasure caring for you.
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
## 2021-02-04 ENCOUNTER — Ambulatory Visit (INDEPENDENT_AMBULATORY_CARE_PROVIDER_SITE_OTHER): Payer: No Typology Code available for payment source | Admitting: Neurology

## 2021-02-04 ENCOUNTER — Encounter: Payer: Self-pay | Admitting: Neurology

## 2021-02-04 ENCOUNTER — Other Ambulatory Visit: Payer: Self-pay

## 2021-02-04 VITALS — BP 115/78 | HR 111 | Resp 20 | Ht 63.0 in | Wt 191.0 lb

## 2021-02-04 DIAGNOSIS — R569 Unspecified convulsions: Secondary | ICD-10-CM

## 2021-02-04 DIAGNOSIS — G43709 Chronic migraine without aura, not intractable, without status migrainosus: Secondary | ICD-10-CM

## 2021-02-04 DIAGNOSIS — R9401 Abnormal electroencephalogram [EEG]: Secondary | ICD-10-CM | POA: Diagnosis not present

## 2021-02-04 NOTE — Patient Instructions (Signed)
1.  Stop Qulipta.  Start Vyepti 100mg .  If no improvement in 3 months (about 1 to 2 weeks prior to next injection), contact me and we will increase dose 2.  Sumatriptan as needed.  Limit use of pain relievers to no more than 2 days out of week to prevent risk of rebound or medication-overuse headache. 3.  Keppra  4. Limit use of pain relievers to no more than 2 days out of week to prevent risk of rebound or medication-overuse headache. 5.  Keep headache diary 6.  Follow up 6 months.

## 2021-02-15 NOTE — Progress Notes (Unsigned)
Clearence Cheek Key: [id number] - PA Case ID: [id number] Need help? Call us at [phone] Status Sent to [place] 100MG /ML solution Form [employer] [facility] Electronic Prior Authorization Form ([employer]) 2017 N[employer] Pending

## 2021-02-16 ENCOUNTER — Telehealth: Payer: Self-pay | Admitting: Neurology

## 2021-02-16 NOTE — Telephone Encounter (Signed)
Patient called in wanting to get an update on the PA for her headache medication. She is in pain and needs the medication

## 2021-02-16 NOTE — Telephone Encounter (Signed)
Pt called and informed that her PA is pending

## 2021-02-25 ENCOUNTER — Telehealth: Payer: Self-pay | Admitting: Neurology

## 2021-02-25 NOTE — Telephone Encounter (Signed)
Please see thanks, I be over in a few minutes

## 2021-02-25 NOTE — Telephone Encounter (Signed)
Patient called in wanting to find out about a Prior Auth for migraine medication

## 2021-02-28 ENCOUNTER — Telehealth: Payer: Self-pay | Admitting: Neurology

## 2021-02-28 NOTE — Telephone Encounter (Signed)
Vyepti 100 MG denied  by [employer] [place] on 02/15/2021 Tracking id: [id number] Member must try and fail Emgality    Forwarded denial to Sheena/Dr Tomi Likens to advise

## 2021-03-01 ENCOUNTER — Telehealth: Payer: Self-pay

## 2021-03-01 ENCOUNTER — Other Ambulatory Visit: Payer: Self-pay | Admitting: Neurology

## 2021-03-01 NOTE — Telephone Encounter (Signed)
Per Efraim Kaufmann,   I have left a message for the patient  that her insurance has denied vyepti andthat a member of the clinical team will call to advise further.     I tried calling pt today no answer. LMOVM to call the office back.  Per Dr.Jaffe,We can contact the patient and offer to try the third CGRP injection option - Emgality - she should know that the first dose will include 2 injections butthen just 1 injection every 28 days thereafter.

## 2021-03-01 NOTE — Telephone Encounter (Signed)
-----   Message from Pieter Partridge, DO sent at 03/01/2021  8:48 AM EDT ----- Regarding: RE: PA Denied We can contact the patient and offer to try the third CGRP injection option - Emgality - she should know that the first dose will include 2 injections but then just 1 injection every 28 days thereafter. ----- Message ----- From: Venetia Night, CMA Sent: 03/01/2021   8:45 AM EDT To: Pieter Partridge, DO Subject: FW: PA Denied                                   ----- Message ----- From: Lynne Leader Sent: 02/28/2021   4:18 PM EDT To: Venetia Night, CMA Subject: PA Denied                                       Hi,  I have left a message for the patient  that her insurance has denied vyepti and that a member of the clinical team will call to advise further.

## 2021-03-02 ENCOUNTER — Ambulatory Visit (HOSPITAL_COMMUNITY)
Admission: EM | Admit: 2021-03-02 | Discharge: 2021-03-02 | Disposition: A | Discharge status: Home / Self Care | Payer: No Typology Code available for payment source | Attending: Emergency Medicine | Admitting: Emergency Medicine

## 2021-03-02 ENCOUNTER — Ambulatory Visit (INDEPENDENT_AMBULATORY_CARE_PROVIDER_SITE_OTHER): Payer: No Typology Code available for payment source

## 2021-03-02 ENCOUNTER — Other Ambulatory Visit: Payer: Self-pay

## 2021-03-02 ENCOUNTER — Encounter (HOSPITAL_COMMUNITY): Payer: Self-pay

## 2021-03-02 DIAGNOSIS — M25571 Pain in right ankle and joints of right foot: Secondary | ICD-10-CM | POA: Diagnosis not present

## 2021-03-02 DIAGNOSIS — W19XXXA Unspecified fall, initial encounter: Secondary | ICD-10-CM | POA: Diagnosis not present

## 2021-03-02 DIAGNOSIS — S92154A Nondisplaced avulsion fracture (chip fracture) of right talus, initial encounter for closed fracture: Secondary | ICD-10-CM

## 2021-03-02 NOTE — ED Triage Notes (Signed)
Pt presents with swelling and pain in the right ankle and right foot x 1 day. Reports she feel on the stairs yesterday.
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
## 2021-03-03 ENCOUNTER — Telehealth: Payer: Self-pay | Admitting: Neurology

## 2021-03-03 NOTE — Telephone Encounter (Signed)
Pt would like to start Emgality. Right now she is unable to get to the office today for a headache cocktail..  No driver  Pt will try to come in Morning, but right now she is unsure. Pt will call us back.

## 2021-03-03 NOTE — Telephone Encounter (Signed)
Kathryn [name] called today and was told she could come in today to get a cocktail. Sheena told her she could come but she cant come until tomorrow. She would like to know if this is okay. [phone]

## 2021-03-03 NOTE — Telephone Encounter (Signed)
Patient called with a headache that has been going on every day for quite some time.   She said her headache medicine was denied through prior authorization and she needs to know what else may help her.  [place]

## 2021-03-03 NOTE — Telephone Encounter (Signed)
Pt called and informed that if she needs headache cocktail to be here tomorrow Friday by 12 pm with a driver pt verbalized understanding

## 2021-03-06 ENCOUNTER — Other Ambulatory Visit: Payer: Self-pay | Admitting: Neurology

## 2021-03-06 MED ORDER — EMGALITY 120 MG/ML ~~LOC~~ SOAJ: 240.0000 mg | Freq: Once | SUBCUTANEOUS | 0 refills | Status: AC

## 2021-03-06 MED ORDER — EMGALITY 120 MG/ML ~~LOC~~ SOAJ: 120.0000 mg | SUBCUTANEOUS | 5 refills | Status: DC

## 2021-03-08 ENCOUNTER — Other Ambulatory Visit: Payer: Self-pay | Admitting: Neurology

## 2021-03-08 ENCOUNTER — Telehealth: Payer: Self-pay | Admitting: Neurology

## 2021-03-08 DIAGNOSIS — G8929 Other chronic pain: Secondary | ICD-10-CM

## 2021-03-08 MED ORDER — PREDNISONE 10 MG PO TABS: ORAL_TABLET | ORAL | 0 refills | Status: DC

## 2021-03-08 NOTE — Telephone Encounter (Signed)
Pt called back in and left a message. Kathryn [name] has not heard anything and doesn't want to take anything that might interfere with whatever Dr. Tomi Likens may give her. Kathryn [name] would like a call back.

## 2021-03-08 NOTE — Telephone Encounter (Signed)
Pt called in stating she is really having a hard time with her headaches. She cannot get rid of the one she has now. She doesn't have a ride to get a headache cocktail. She is wondering what she could do?

## 2021-03-08 NOTE — Telephone Encounter (Signed)
Called patient and informed her that Dr. Tomi Likens has offered her a Prednisone taper. Patient stated that she would like that sent to her pharmacy. Informed patient that she will take 6tabs x1day, then 5tabs x1day, then 4tabs x1day, then 3tabs x1day, then 2tabs x1day, then 1tab x1day, then STOP. Patient verbalized understanding.

## 2021-03-09 NOTE — Telephone Encounter (Signed)
Patient called said she'd like a call back with an update on the PA for her Emgality.

## 2021-03-10 NOTE — Telephone Encounter (Signed)
Pt advised MA out sick last week and provider,Will start PA today. Okay to pick up Emagality from office.

## 2021-03-10 NOTE — Telephone Encounter (Signed)
Kathryn [name] Key: Jarold Motto - PA Case ID: [id number] - Rx #: [id number] Need help? Call us at [phone] Outcome Approvedtoday Approved. This drug has been approved. Approved quantity: 2 units per 30 day(s). The drug has been approved from 02/24/2021 to 06/08/2021. Please call the pharmacy to process your prescription claim. Drug Emgality 120MG /ML auto-injectors (migraine) Form [employer] [facility] Electronic Prior Authorization Form ([employer]) 2017 NCPDP Original Claim Info [phone]

## 2021-03-10 NOTE — Telephone Encounter (Signed)
PA submitted.

## 2021-03-21 ENCOUNTER — Other Ambulatory Visit: Payer: Self-pay

## 2021-03-21 ENCOUNTER — Ambulatory Visit: Payer: No Typology Code available for payment source | Admitting: Family Medicine

## 2021-03-21 ENCOUNTER — Encounter: Payer: Self-pay | Admitting: Family Medicine

## 2021-03-21 VITALS — BP 100/62 | HR 102 | Temp 98.0°F | Ht 63.0 in | Wt 191.8 lb

## 2021-03-21 DIAGNOSIS — Z23 Encounter for immunization: Secondary | ICD-10-CM

## 2021-03-21 DIAGNOSIS — Z0000 Encounter for general adult medical examination without abnormal findings: Secondary | ICD-10-CM

## 2021-03-21 DIAGNOSIS — Z1231 Encounter for screening mammogram for malignant neoplasm of breast: Secondary | ICD-10-CM | POA: Diagnosis not present

## 2021-03-21 DIAGNOSIS — M818 Other osteoporosis without current pathological fracture: Secondary | ICD-10-CM

## 2021-03-21 DIAGNOSIS — E063 Autoimmune thyroiditis: Secondary | ICD-10-CM | POA: Diagnosis not present

## 2021-03-21 DIAGNOSIS — F339 Major depressive disorder, recurrent, unspecified: Secondary | ICD-10-CM

## 2021-03-21 DIAGNOSIS — G43009 Migraine without aura, not intractable, without status migrainosus: Secondary | ICD-10-CM | POA: Diagnosis not present

## 2021-03-21 DIAGNOSIS — E042 Nontoxic multinodular goiter: Secondary | ICD-10-CM | POA: Diagnosis not present

## 2021-03-21 DIAGNOSIS — K9041 Non-celiac gluten sensitivity: Secondary | ICD-10-CM

## 2021-03-21 DIAGNOSIS — E782 Mixed hyperlipidemia: Secondary | ICD-10-CM | POA: Diagnosis not present

## 2021-03-21 LAB — CBC WITH DIFFERENTIAL/PLATELET
Basophils Absolute: 0.1 10*3/uL (ref 0.0–0.1)
Basophils Relative: 0.6 % (ref 0.0–3.0)
Eosinophils Absolute: 0.2 10*3/uL (ref 0.0–0.7)
Eosinophils Relative: 1.8 % (ref 0.0–5.0)
HCT: 39.9 % (ref 36.0–46.0)
Hemoglobin: 13.6 g/dL (ref 12.0–15.0)
Lymphocytes Relative: 28.4 % (ref 12.0–46.0)
Lymphs Abs: 2.5 10*3/uL (ref 0.7–4.0)
MCHC: 34.1 g/dL (ref 30.0–36.0)
MCV: 94.3 fl (ref 78.0–100.0)
Monocytes Absolute: 0.7 10*3/uL (ref 0.1–1.0)
Monocytes Relative: 7.8 % (ref 3.0–12.0)
Neutro Abs: 5.5 10*3/uL (ref 1.4–7.7)
Neutrophils Relative %: 61.4 % (ref 43.0–77.0)
Platelets: 264 10*3/uL (ref 150.0–400.0)
RBC: 4.23 Mil/uL (ref 3.87–5.11)
RDW: 14 % (ref 11.5–15.5)
WBC: 9 10*3/uL (ref 4.0–10.5)

## 2021-03-21 LAB — COMPREHENSIVE METABOLIC PANEL
ALT: 16 U/L (ref 0–35)
AST: 16 U/L (ref 0–37)
Albumin: 4.5 g/dL (ref 3.5–5.2)
Alkaline Phosphatase: 50 U/L (ref 39–117)
BUN: 24 mg/dL — ABNORMAL HIGH (ref 6–23)
CO2: 28 mEq/L (ref 19–32)
Calcium: 9.6 mg/dL (ref 8.4–10.5)
Chloride: 101 mEq/L (ref 96–112)
Creatinine, Ser: 0.99 mg/dL (ref 0.40–1.20)
GFR: 62.77 mL/min (ref >60.00)
Glucose, Bld: 91 mg/dL (ref 70–99)
Potassium: 3.6 mEq/L (ref 3.5–5.1)
Sodium: 139 mEq/L (ref 135–145)
Total Bilirubin: 0.4 mg/dL (ref 0.2–1.2)
Total Protein: 7.5 g/dL (ref 6.0–8.3)

## 2021-03-21 LAB — TSH: TSH: 1.31 u[IU]/mL (ref 0.35–5.50)

## 2021-03-21 LAB — LIPID PANEL
Cholesterol: 187 mg/dL (ref 0–200)
HDL: 75.2 mg/dL (ref >39.00)
LDL Cholesterol: 91 mg/dL (ref 0–99)
NonHDL: 111.59
Total CHOL/HDL Ratio: 2
Triglycerides: 104 mg/dL (ref 0.0–149.0)
VLDL: 20.8 mg/dL (ref 0.0–40.0)

## 2021-03-21 NOTE — Patient Instructions (Addendum)
Please return in 6 months for recheck  I will release your lab results to you on your MyChart account with further instructions. Please reply with any questions.    Today you were given your 1st of 2 shingrix vaccinations.  We will give your 2nd at your 6 month follow up appointment.  If you have any questions or concerns, please don't hesitate to send me a message via MyChart or call the office at [phone]. Thank you for visiting with [place] today! It's our pleasure caring for you.   Recommendations for women to keep healthy:   EXERCISE AND DIET: We recommended that you start or continue a regular exercise program for good health. Regular exercise means any activity that makes your heart beat faster and makes you sweat. We recommend exercising at least 30 minutes per day at least 3 days a week, preferably 4 or 5. We also recommend a diet low in fat and sugar. Inactivity, poor dietary choices and obesity can cause diabetes, heart attack, stroke, and kidney damage, among others.   ALCOHOL AND SMOKING: Women should limit their alcohol intake to no more than 7 drinks/beers/glasses of wine (combined, not each!) per week. Moderation of alcohol intake to this level decreases your risk of breast cancer and liver damage. And of course, no recreational drugs are part of a healthy lifestyle. And absolutely no smoking or even second hand smoke. Most people know smoking can cause heart and lung diseases, but did you know it also contributes to weakening of your bones? Aging of your skin? Yellowing of your teeth and nails?  CALCIUM AND VITAMIN D: Adequate intake of calcium and Vitamin D are recommended. The recommendations for exact amounts of these supplements seem to change often, but generally speaking 600 mg of calcium (either carbonate or citrate) and 800 units of Vitamin D per day seems prudent. Certain women may benefit from higher intake of Vitamin D. If you are among these women, your doctor will have told  you during your visit.    MAMMOGRAMS: All women over 81 years old should have a yearly mammogram. Many facilities now offer a "3D" mammogram, which may cost around $50 extra out of pocket. If possible, we recommend you accept the option to have the 3D mammogram performed. It both reduces the number of women who will be called back for extra views which then turn out to be normal, and it is better than the routine mammogram at detecting truly abnormal areas.   COLONOSCOPY: Colonoscopy to screen for colon cancer is recommended for all women at age 35. We know, you hate the idea of the prep. We agree, BUT, having colon cancer and not knowing it is worse!! Colon cancer so often starts as a polyp that can be seen and removed at colonscopy, which can quite literally save your life! And if your first colonoscopy is normal and you have no family history of colon cancer, most women don't have to have it again for 10 years. Once every ten years, you can do something that may end up saving your life, right? We will be happy to help you get it scheduled when you are ready. Be sure to check your insurance coverage so you understand how much it will cost. It may be covered as a preventative service at no cost, but you should check your particular policy.
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
## 2021-04-01 ENCOUNTER — Other Ambulatory Visit: Payer: Self-pay | Admitting: Family Medicine

## 2021-04-01 DIAGNOSIS — Z09 Encounter for follow-up examination after completed treatment for conditions other than malignant neoplasm: Secondary | ICD-10-CM

## 2021-04-01 DIAGNOSIS — Z1231 Encounter for screening mammogram for malignant neoplasm of breast: Secondary | ICD-10-CM

## 2021-04-01 DIAGNOSIS — M818 Other osteoporosis without current pathological fracture: Secondary | ICD-10-CM

## 2021-04-01 DIAGNOSIS — Z0000 Encounter for general adult medical examination without abnormal findings: Secondary | ICD-10-CM

## 2021-04-04 ENCOUNTER — Other Ambulatory Visit: Payer: Self-pay | Admitting: Neurology

## 2021-04-05 ENCOUNTER — Other Ambulatory Visit: Payer: Self-pay | Admitting: Neurology

## 2021-04-06 ENCOUNTER — Other Ambulatory Visit: Payer: Self-pay | Admitting: Neurology

## 2021-04-07 ENCOUNTER — Telehealth: Payer: Self-pay | Admitting: Neurology

## 2021-04-07 NOTE — Telephone Encounter (Signed)
Pt would like a call back regarding her emgality. She is having issues with her pharmacy about refills and she is confused. [phone]

## 2021-04-08 MED ORDER — EMGALITY 120 MG/ML ~~LOC~~ SOAJ: SUBCUTANEOUS | 5 refills | Status: DC

## 2021-04-08 NOTE — Telephone Encounter (Signed)
RX changed to 1 pen every month for the 5 refills.   Telephone call to [facility], pt script should be  Emgality 1 pen 5 refills.

## 2021-04-17 ENCOUNTER — Encounter: Payer: Self-pay | Admitting: Family Medicine

## 2021-04-18 NOTE — Telephone Encounter (Signed)
Okay for referral to Dermatology? 

## 2021-04-19 ENCOUNTER — Encounter: Payer: Self-pay | Admitting: Family Medicine

## 2021-04-20 ENCOUNTER — Other Ambulatory Visit: Payer: Self-pay

## 2021-04-20 ENCOUNTER — Encounter: Payer: Self-pay | Admitting: Family Medicine

## 2021-04-20 DIAGNOSIS — L309 Dermatitis, unspecified: Secondary | ICD-10-CM

## 2021-04-20 DIAGNOSIS — E042 Nontoxic multinodular goiter: Secondary | ICD-10-CM

## 2021-04-20 DIAGNOSIS — E063 Autoimmune thyroiditis: Secondary | ICD-10-CM

## 2021-04-21 NOTE — Telephone Encounter (Signed)
See other message, done.

## 2021-04-30 ENCOUNTER — Other Ambulatory Visit: Payer: Self-pay | Admitting: Family Medicine

## 2021-04-30 DIAGNOSIS — J4541 Moderate persistent asthma with (acute) exacerbation: Secondary | ICD-10-CM

## 2021-04-30 DIAGNOSIS — J301 Allergic rhinitis due to pollen: Secondary | ICD-10-CM

## 2021-05-31 ENCOUNTER — Ambulatory Visit: Payer: No Typology Code available for payment source | Admitting: Internal Medicine

## 2021-07-04 ENCOUNTER — Ambulatory Visit: Payer: No Typology Code available for payment source | Admitting: Internal Medicine

## 2021-07-04 ENCOUNTER — Encounter: Payer: Self-pay | Admitting: Internal Medicine

## 2021-07-04 ENCOUNTER — Other Ambulatory Visit: Payer: Self-pay

## 2021-07-04 VITALS — BP 120/72 | HR 125 | Ht 63.0 in | Wt 189.8 lb

## 2021-07-04 DIAGNOSIS — E063 Autoimmune thyroiditis: Secondary | ICD-10-CM

## 2021-07-04 DIAGNOSIS — E042 Nontoxic multinodular goiter: Secondary | ICD-10-CM | POA: Diagnosis not present

## 2021-07-04 LAB — T4, FREE: Free T4: 0.78 ng/dL (ref 0.60–1.60)

## 2021-07-04 LAB — T3, FREE: T3, Free: 2.7 pg/mL (ref 2.3–4.2)

## 2021-07-04 LAB — TSH: TSH: 2.87 u[IU]/mL (ref 0.35–5.50)

## 2021-07-04 NOTE — Patient Instructions (Signed)
Please come back for a follow-up appointment in 1 year.  I will order a new thyroid U/S.  Please stop at the lab.
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
## 2021-07-05 LAB — THYROGLOBULIN ANTIBODY: Thyroglobulin Ab: 27 IU/mL — ABNORMAL HIGH (ref <=1)

## 2021-07-05 LAB — THYROID PEROXIDASE ANTIBODY: Thyroperoxidase Ab SerPl-aCnc: 3 IU/mL (ref <9)

## 2021-07-26 ENCOUNTER — Telehealth: Payer: Self-pay

## 2021-07-26 NOTE — Telephone Encounter (Signed)
Spoke with patient, gave a verbal understanding °

## 2021-07-26 NOTE — Telephone Encounter (Signed)
Pt was scheduled for 09/26/21. She is now scheduled for 10/12/21. Pt wants to know if she can come in prior to that appt for the pneumonia vaccine. Please Advise.

## 2021-07-29 ENCOUNTER — Other Ambulatory Visit: Payer: Self-pay | Admitting: Neurology

## 2021-08-09 ENCOUNTER — Telehealth: Payer: Self-pay

## 2021-08-09 NOTE — Telephone Encounter (Signed)
New message  [facility] is unable to respond with clinical questions. Please see more information at the bottom of the page for next steps.  Clearence Cheek Key: Kathrynn Humble help? Call us at [phone] Outcome Additional Information Required The receiver is not the PA processor for this patient Drug Qulipta 60MG  tablets Form [facility] Electronic Prior Authorization Form ([facility]) 2017 NCPDP

## 2021-08-10 ENCOUNTER — Other Ambulatory Visit: Payer: Self-pay

## 2021-08-10 ENCOUNTER — Encounter: Payer: Self-pay | Admitting: Family Medicine

## 2021-08-10 MED ORDER — ALPRAZOLAM 1 MG PO TABS: 1.0000 mg | ORAL_TABLET | Freq: Every day | ORAL | 5 refills | Status: DC

## 2021-08-10 NOTE — Progress Notes (Signed)
Last Refill 01/19/2021 Last OV 03/21/2021 dx annual physical

## 2021-08-20 ENCOUNTER — Other Ambulatory Visit: Payer: Self-pay | Admitting: Family Medicine

## 2021-08-20 DIAGNOSIS — F331 Major depressive disorder, recurrent, moderate: Secondary | ICD-10-CM

## 2021-08-22 ENCOUNTER — Telehealth: Payer: Self-pay | Admitting: Neurology

## 2021-08-25 ENCOUNTER — Encounter: Payer: Self-pay | Admitting: Neurology

## 2021-08-25 ENCOUNTER — Telehealth (INDEPENDENT_AMBULATORY_CARE_PROVIDER_SITE_OTHER): Payer: No Typology Code available for payment source | Admitting: Neurology

## 2021-08-25 ENCOUNTER — Other Ambulatory Visit: Payer: Self-pay

## 2021-08-25 VITALS — Ht 64.0 in | Wt 190.0 lb

## 2021-08-25 DIAGNOSIS — R569 Unspecified convulsions: Secondary | ICD-10-CM

## 2021-08-25 DIAGNOSIS — M545 Low back pain, unspecified: Secondary | ICD-10-CM

## 2021-08-25 DIAGNOSIS — G8929 Other chronic pain: Secondary | ICD-10-CM

## 2021-08-25 DIAGNOSIS — G43709 Chronic migraine without aura, not intractable, without status migrainosus: Secondary | ICD-10-CM | POA: Diagnosis not present

## 2021-08-25 DIAGNOSIS — R9401 Abnormal electroencephalogram [EEG]: Secondary | ICD-10-CM | POA: Diagnosis not present

## 2021-08-25 MED ORDER — LEVETIRACETAM 500 MG PO TABS: ORAL_TABLET | ORAL | 0 refills | Status: DC

## 2021-08-25 MED ORDER — CYCLOBENZAPRINE HCL 10 MG PO TABS: ORAL_TABLET | ORAL | 1 refills | Status: DC

## 2021-08-25 MED ORDER — SUMATRIPTAN SUCCINATE 100 MG PO TABS: ORAL_TABLET | ORAL | 5 refills | Status: DC
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
## 2021-09-16 ENCOUNTER — Ambulatory Visit (HOSPITAL_COMMUNITY)
Admission: EM | Admit: 2021-09-16 | Discharge: 2021-09-16 | Disposition: A | Discharge status: Home / Self Care | Payer: No Typology Code available for payment source | Attending: Emergency Medicine | Admitting: Emergency Medicine

## 2021-09-16 ENCOUNTER — Encounter (HOSPITAL_COMMUNITY): Payer: Self-pay

## 2021-09-16 ENCOUNTER — Other Ambulatory Visit: Payer: Self-pay

## 2021-09-16 DIAGNOSIS — M545 Low back pain, unspecified: Secondary | ICD-10-CM

## 2021-09-16 DIAGNOSIS — M546 Pain in thoracic spine: Secondary | ICD-10-CM | POA: Diagnosis not present

## 2021-09-16 DIAGNOSIS — G8929 Other chronic pain: Secondary | ICD-10-CM | POA: Diagnosis not present

## 2021-09-16 MED ORDER — METHYLPREDNISOLONE SODIUM SUCC 125 MG IJ SOLR
60.0000 mg | Freq: Once | INTRAMUSCULAR | Status: AC
  Administered 2021-09-16: 60 mg via INTRAMUSCULAR

## 2021-09-16 MED ORDER — CYCLOBENZAPRINE HCL 10 MG PO TABS: 10.0000 mg | ORAL_TABLET | Freq: Two times a day (BID) | ORAL | 0 refills | Status: DC | PRN

## 2021-09-16 MED ORDER — PREDNISONE 20 MG PO TABS: 40.0000 mg | ORAL_TABLET | Freq: Every day | ORAL | 0 refills | Status: DC

## 2021-09-16 MED ORDER — METHYLPREDNISOLONE SODIUM SUCC 125 MG IJ SOLR
INTRAMUSCULAR | Status: AC
  Filled 2021-09-16: qty 2

## 2021-09-16 NOTE — ED Triage Notes (Signed)
Pt c/o mid to upper back pain x3 days. Denies injury.

## 2021-09-16 NOTE — Discharge Instructions (Signed)
Your pain is most likely caused by irritation to the muscles or ligaments.   Take prednisone every morning for 5 days  May use flexeril twice a day to help relax muscles  You may use heating pad in 15 minute intervals as needed for additional comfort, or  2-3 days you may find comfort in using ice in 10-15 minutes over affected area  Begin stretching affected area daily for 10 minutes as tolerated to further loosen muscles   When lying down place pillow underneath and between knees for support  Can try sleeping without pillow on firm mattress   Practice good posture: head back, shoulders back, chest forward, pelvis back and weight distributed evenly on both legs  If pain persist after recommended treatment or reoccurs if may be beneficial to follow up with orthopedic specialist for evaluation, this doctor specializes in the bones and can manage your symptoms long-term with options such as but not limited to imaging, medications or physical therapy
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
## 2021-09-18 ENCOUNTER — Encounter: Payer: Self-pay | Admitting: Family Medicine

## 2021-09-21 ENCOUNTER — Other Ambulatory Visit: Payer: Self-pay

## 2021-09-21 ENCOUNTER — Other Ambulatory Visit: Payer: Self-pay | Admitting: Family Medicine

## 2021-09-21 DIAGNOSIS — Z09 Encounter for follow-up examination after completed treatment for conditions other than malignant neoplasm: Secondary | ICD-10-CM

## 2021-09-21 DIAGNOSIS — Z8781 Personal history of (healed) traumatic fracture: Secondary | ICD-10-CM

## 2021-09-22 ENCOUNTER — Ambulatory Visit
Admission: RE | Admit: 2021-09-22 | Discharge: 2021-09-22 | Disposition: A | Discharge status: Home / Self Care | Payer: No Typology Code available for payment source | Source: Ambulatory Visit | Attending: Internal Medicine | Admitting: Internal Medicine

## 2021-09-22 DIAGNOSIS — E042 Nontoxic multinodular goiter: Secondary | ICD-10-CM

## 2021-09-26 ENCOUNTER — Ambulatory Visit: Payer: No Typology Code available for payment source | Admitting: Family Medicine

## 2021-09-27 ENCOUNTER — Other Ambulatory Visit: Payer: No Typology Code available for payment source

## 2021-09-27 ENCOUNTER — Ambulatory Visit
Admission: RE | Admit: 2021-09-27 | Discharge: 2021-09-27 | Disposition: A | Discharge status: Home / Self Care | Payer: No Typology Code available for payment source | Source: Ambulatory Visit | Attending: Family Medicine | Admitting: Family Medicine

## 2021-09-27 ENCOUNTER — Ambulatory Visit: Payer: No Typology Code available for payment source

## 2021-09-27 DIAGNOSIS — Z09 Encounter for follow-up examination after completed treatment for conditions other than malignant neoplasm: Secondary | ICD-10-CM

## 2021-09-27 DIAGNOSIS — M818 Other osteoporosis without current pathological fracture: Secondary | ICD-10-CM

## 2021-09-28 ENCOUNTER — Other Ambulatory Visit: Payer: Self-pay

## 2021-10-12 ENCOUNTER — Ambulatory Visit: Payer: No Typology Code available for payment source | Admitting: Family Medicine

## 2021-10-14 ENCOUNTER — Other Ambulatory Visit: Payer: Self-pay | Admitting: Neurology

## 2021-10-14 DIAGNOSIS — G8929 Other chronic pain: Secondary | ICD-10-CM

## 2021-11-10 ENCOUNTER — Telehealth: Payer: Self-pay | Admitting: Family Medicine

## 2021-11-10 NOTE — Telephone Encounter (Signed)
Patient would like to start [employer] authorization. Patient haves an apt with Dr Jonni Sanger on 12/05/21 with Dr Jonni Sanger.  ?

## 2021-11-18 ENCOUNTER — Other Ambulatory Visit: Payer: Self-pay | Admitting: Family Medicine

## 2021-11-21 ENCOUNTER — Ambulatory Visit: Payer: No Typology Code available for payment source | Admitting: Family Medicine

## 2021-11-21 ENCOUNTER — Telehealth: Payer: Self-pay

## 2021-11-21 ENCOUNTER — Encounter: Payer: Self-pay | Admitting: Neurology

## 2021-11-21 NOTE — Telephone Encounter (Signed)
Verification in process with [employer], waiting on determination. ?

## 2021-11-21 NOTE — Telephone Encounter (Signed)
Noted  

## 2021-11-22 ENCOUNTER — Telehealth: Payer: Self-pay | Admitting: Pharmacy Technician

## 2021-11-22 ENCOUNTER — Other Ambulatory Visit: Payer: Self-pay

## 2021-11-22 NOTE — Telephone Encounter (Addendum)
Auth Submission: no auth needed ?Payer: [employer] ?Medication & CPT/J Code(s) submitted: Vyepti (Eptinezumab) F5830 ?Route of submission (phone, fax, portal): phone: [phone] ?Auth type: Buy/Bill ?Units/visits requested: 100 mg q38mo?Reference number: 1st verify Ale-P 11/22/21'@11'$ .02a ?2nd verify:Brittany-H 11/22/21 @ 11:56 ?Approval from: 11/22/21 to 12/24/22  ? ?Patient will be scheduled as soon as possible. ?

## 2021-11-27 ENCOUNTER — Ambulatory Visit (HOSPITAL_COMMUNITY)
Admission: EM | Admit: 2021-11-27 | Discharge: 2021-11-27 | Disposition: A | Discharge status: Home / Self Care | Payer: No Typology Code available for payment source | Attending: Emergency Medicine | Admitting: Emergency Medicine

## 2021-11-27 ENCOUNTER — Ambulatory Visit (INDEPENDENT_AMBULATORY_CARE_PROVIDER_SITE_OTHER): Payer: No Typology Code available for payment source

## 2021-11-27 ENCOUNTER — Encounter (HOSPITAL_COMMUNITY): Payer: Self-pay | Admitting: Emergency Medicine

## 2021-11-27 DIAGNOSIS — J45901 Unspecified asthma with (acute) exacerbation: Secondary | ICD-10-CM

## 2021-11-27 DIAGNOSIS — R0602 Shortness of breath: Secondary | ICD-10-CM | POA: Diagnosis not present

## 2021-11-27 DIAGNOSIS — J9801 Acute bronchospasm: Secondary | ICD-10-CM

## 2021-11-27 DIAGNOSIS — J4521 Mild intermittent asthma with (acute) exacerbation: Secondary | ICD-10-CM | POA: Diagnosis not present

## 2021-11-27 DIAGNOSIS — R059 Cough, unspecified: Secondary | ICD-10-CM

## 2021-11-27 MED ORDER — FLUTICASONE PROPIONATE 50 MCG/ACT NA SUSP: 2.0000 | Freq: Every day | NASAL | 1 refills | Status: AC

## 2021-11-27 MED ORDER — TRELEGY ELLIPTA 100-62.5-25 MCG/ACT IN AEPB: 1.0000 | INHALATION_SPRAY | Freq: Every morning | RESPIRATORY_TRACT | 1 refills | Status: AC

## 2021-11-27 MED ORDER — METHYLPREDNISOLONE 4 MG PO TBPK: ORAL_TABLET | ORAL | 0 refills | Status: DC

## 2021-11-27 MED ORDER — ALBUTEROL SULFATE HFA 108 (90 BASE) MCG/ACT IN AERS: 2.0000 | INHALATION_SPRAY | Freq: Four times a day (QID) | RESPIRATORY_TRACT | 1 refills | Status: DC | PRN

## 2021-11-27 MED ORDER — ALBUTEROL SULFATE (2.5 MG/3ML) 0.083% IN NEBU
INHALATION_SOLUTION | RESPIRATORY_TRACT | Status: AC
  Filled 2021-11-27: qty 3

## 2021-11-27 MED ORDER — CETIRIZINE HCL 10 MG PO TABS: 10.0000 mg | ORAL_TABLET | Freq: Every day | ORAL | 0 refills | Status: DC

## 2021-11-27 MED ORDER — PROMETHAZINE-DM 6.25-15 MG/5ML PO SYRP: 5.0000 mL | ORAL_SOLUTION | Freq: Four times a day (QID) | ORAL | 0 refills | Status: DC | PRN

## 2021-11-27 MED ORDER — ALBUTEROL SULFATE (2.5 MG/3ML) 0.083% IN NEBU
2.5000 mg | INHALATION_SOLUTION | Freq: Once | RESPIRATORY_TRACT | Status: AC
  Administered 2021-11-27: 2.5 mg via RESPIRATORY_TRACT

## 2021-11-27 NOTE — ED Triage Notes (Signed)
Pt reports had a procedure on Monday. Since Wed had dry cough and reports worse at night. Adds takes a lot of effort to cough. Having back pains from coughing.   ?
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
## 2021-11-27 NOTE — Discharge Instructions (Addendum)
Your symptoms and my physical exam findings are concerning for exacerbation of your underlying allergies.  It is important that you are consistent with taking allergy medications exactly as prescribed.  Your symptoms and my physical exam findings are also concerning for exacerbation of your underlying asthma.  It is important that you are consistent with taking your inhaled medications exactly as prescribed.   ?   ?Please see the list below for recommended medications, dosages and frequencies to provide relief of your current symptoms:   ?  ?Methylprednisolone (Medrol Dosepak): This is a steroid that will significantly calm your upper and lower airways, please take one row of tablets daily with your breakfast meal starting tomorrow morning until the prescription is complete.    ? ?Zyrtec (cetirizine): This is an excellent second-generation antihistamine that helps to reduce respiratory inflammatory response to environmental allergens.  In some patients, this medication can cause daytime sleepiness so I recommend that you take 1 tablet daily at bedtime.  I provided you with a prescription for this medication.  I provided you with a coupon for this medication. ?  ?Flonase (fluticasone): This is a steroid nasal spray that you use once daily, 1 spray in each nare.  This medication does not work well if you decide to use it only used as you feel you need to, it works best used on a daily basis.  After 3 to 5 days of use, you will notice significant reduction of the inflammation and mucus production that is currently being caused by exposure to allergens, whether seasonal or environmental.  The most common side effect of this medication is nosebleeds.  If you experience a nosebleed, please discontinue use for 1 week, then feel free to resume.  I have provided you with a prescription but you can also purchase this medication over-the-counter if your insurance will not cover it.  I have provided you with a coupon for this  medication. ?  ?ProAir, Ventolin, Proventil (albuterol): This inhaled medication contains a short acting beta agonist bronchodilator.  This medication works on the smooth muscle that opens and constricts of your airways by relaxing the muscle.  The result of relaxation of the smooth muscle is increased air movement and improved work of breathing.  This is a short acting medication that can be used every 4-6 hours as needed for increased work of breathing, shortness of breath, wheezing and excessive coughing.  I have provided you with a prescription for albuterol should your current prescription be expired or close to expiring.  I provided you with a coupon for this medication. ?  ?Trelegy (fluticasone, vilanterol and umeclidinium):  This inhaled medication contains a corticosteroid and long-acting form of albuterol.  The inhaled steroid and this medication  is not absorbed into the body and will not cause side effects such as increased blood sugar levels, irritability, sleeplessness or weight gain.  Inhaled corticosteroid are sort of like topical steroid creams but, as you can imagine, it is not practical to attempt to rub a steroid cream inside of your lungs.  The long-acting albuterol works similarly to the short acting albuterol found in your rescue inhaler but provides 24-hour relaxation of the smooth muscles that open and constrict your airways; your short acting rescue inhaler can only provide for a few hours this benefit for a few hours.  The third unique ingredient, umeclidinium, is an antimuscarinic and works similarly to your albuterol, providing long-acting relaxation of the smooth muscles in your airway.  Please feel free  to continue using your short acting rescue inhaler as often as needed throughout the day for shortness of breath, wheezing, and cough.  I provided you with a coupon for this medication. ? ?Promethazine DM: Promethazine is both a nasal decongestant and an antinausea medication that makes  most patients feel fairly sleepy.  The DM is dextromethorphan, a cough suppressant found in many over-the-counter cough medications.  Please take 5 mL before bedtime to minimize your cough which will help you sleep better.  I have provided you with a prescription for this medication.    ?  ?Please follow-up within the next 5-7 days either with your primary care provider or urgent care if your symptoms do not resolve.  If you do not have a primary care provider, we will assist you in finding one. ?  ?Thank you for visiting urgent care today.  We appreciate the opportunity to participate in your care. ? ?

## 2021-11-30 ENCOUNTER — Ambulatory Visit: Payer: No Typology Code available for payment source

## 2021-12-05 ENCOUNTER — Ambulatory Visit: Payer: No Typology Code available for payment source | Admitting: Family Medicine

## 2021-12-05 ENCOUNTER — Ambulatory Visit (INDEPENDENT_AMBULATORY_CARE_PROVIDER_SITE_OTHER): Payer: No Typology Code available for payment source

## 2021-12-05 ENCOUNTER — Other Ambulatory Visit: Payer: Self-pay

## 2021-12-05 VITALS — BP 105/69 | HR 77 | Temp 97.9°F | Resp 18 | Ht 63.0 in | Wt 187.4 lb

## 2021-12-05 DIAGNOSIS — G43009 Migraine without aura, not intractable, without status migrainosus: Secondary | ICD-10-CM

## 2021-12-05 MED ORDER — SODIUM CHLORIDE 0.9 % IV SOLN
100.0000 mg | Freq: Once | INTRAVENOUS | Status: AC
  Administered 2021-12-05: 100 mg via INTRAVENOUS
  Filled 2021-12-05: qty 1

## 2021-12-05 NOTE — Progress Notes (Signed)
Diagnosis: Migraines ? ?Provider:  Marshell Garfinkel, MD ? ?Procedure: Infusion ? ?IV Type: Peripheral, IV Location: L Hand ? ?Vyepti (Eptinezumab-jjmr), Dose: 100 mg ? ?Infusion Start Time: 2633 ? ?Infusion Stop Time: 3545 ? ?Post Infusion IV Care: 30  minutes Observation period completed and Peripheral IV Discontinued ? ?Discharge: Condition: Good, Destination: Home . AVS provided to patient.  ? ?Performed by:  Raetta Agostinelli, RN  ?  ?

## 2021-12-05 NOTE — Telephone Encounter (Signed)
Pt is wanting to know if there are any updates as to whether she has been approved or is it still in process? ?

## 2021-12-07 ENCOUNTER — Ambulatory Visit: Payer: No Typology Code available for payment source | Admitting: Family Medicine

## 2021-12-07 ENCOUNTER — Encounter: Payer: Self-pay | Admitting: Family Medicine

## 2021-12-07 VITALS — BP 113/78 | HR 104 | Temp 97.4°F | Ht 63.0 in | Wt 188.4 lb

## 2021-12-07 DIAGNOSIS — G250 Essential tremor: Secondary | ICD-10-CM | POA: Diagnosis not present

## 2021-12-07 DIAGNOSIS — F411 Generalized anxiety disorder: Secondary | ICD-10-CM | POA: Diagnosis not present

## 2021-12-07 DIAGNOSIS — M8080XS Other osteoporosis with current pathological fracture, unspecified site, sequela: Secondary | ICD-10-CM

## 2021-12-07 DIAGNOSIS — F339 Major depressive disorder, recurrent, unspecified: Secondary | ICD-10-CM

## 2021-12-07 DIAGNOSIS — R569 Unspecified convulsions: Secondary | ICD-10-CM | POA: Insufficient documentation

## 2021-12-07 DIAGNOSIS — J4540 Moderate persistent asthma, uncomplicated: Secondary | ICD-10-CM

## 2021-12-07 MED ORDER — PEAK FLOW METER DEVI: 0 refills | Status: DC

## 2021-12-07 MED ORDER — ALPRAZOLAM 1 MG PO TABS: 0.5000 mg | ORAL_TABLET | Freq: Every day | ORAL | 5 refills | Status: DC

## 2021-12-07 MED ORDER — TRAZODONE HCL 50 MG PO TABS
25.0000 mg | ORAL_TABLET | Freq: Every evening | ORAL | 3 refills | Status: DC | PRN
Start: 2021-12-07 — End: 2022-03-23
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
## 2021-12-07 NOTE — Patient Instructions (Signed)
Please return in 6-8 weeks to recheck asthma and sleep.  ? ?We will look into your prolia authorization. It was last checked 3/13.  ? ?Start monitoring your peak flows at different times and logging the numbers for my review.  ? ?Please decrease to alprazolam 0.'5mg'$  nightly for a week, ?Then change to every other night for two weeks,  ?Then then change to T and th and then stop. ? ?You may use the trazodone nightly now.  ? ?If you have any questions or concerns, please don't hesitate to send me a message via MyChart or call the office at [phone]. Thank you for visiting with [place] today! It's our pleasure caring for you.  ? ?Essential Tremor ?A tremor is trembling or shaking that a person cannot control. Most tremors affect the hands or arms. Tremors can also affect the head, vocal cords, legs, and other parts of the body. Essential tremor is a tremor without a known cause. Usually, it occurs while a person is trying to perform an action. It tends to get worse gradually as a person ages. ?What are the causes? ?The cause of this condition is not known. ?What increases the risk? ?You are more likely to develop this condition if: ?You have a family member with essential tremor. ?You are age 60 or older. ?You take certain medicines. ?What are the signs or symptoms? ?The main sign of a tremor is a rhythmic shaking of certain parts of your body that is uncontrolled and unintentional. You may: ?Have difficulty eating with a spoon or fork. ?Have difficulty writing. ?Nod your head up and down or side to side. ?Have a quivering voice. ?The shaking may: ?Get worse over time. ?Come and go. ?Be more noticeable on one side of your body. ?Get worse due to stress, fatigue, caffeine, and extreme heat or cold. ?How is this diagnosed? ?This condition may be diagnosed based on: ?Your symptoms and medical history. ?A physical exam. ?There is no single test to diagnose an essential tremor. However, your health care provider may order  tests to rule out other causes of your condition. These may include: ?Blood and urine tests. ?Imaging studies of your brain, such as CT scan and MRI. ?A test that measures involuntary muscle movement (electromyogram). ?How is this treated? ?Treatment for essential tremor depends on the severity of the condition. ?Some tremors may go away without treatment. ?Mild tremors may not need treatment if they do not affect your day-to-day life. ?Severe tremors may need to be treated using one or more of the following options: ?Medicines. ?Lifestyle changes. ?Occupational or physical therapy. ?Follow these instructions at home: ?Lifestyle ? ?Do not use any products that contain nicotine or tobacco, such as cigarettes and e-cigarettes. If you need help quitting, ask your health care provider. ?Limit your caffeine intake as told by your health care provider. ?Try to get 8 hours of sleep each night. ?Find ways to manage your stress that fits your lifestyle and personality. Consider trying meditation or yoga. ?Try to anticipate stressful situations and allow extra time to manage them. ?If you are struggling emotionally with the effects of your tremor, consider working with a mental health provider. ?General instructions ?Take over-the-counter and prescription medicines only as told by your health care provider. ?Avoid extreme heat and extreme cold. ?Keep all follow-up visits as told by your health care provider. This is important. Visits may include physical therapy visits. ?Contact a health care provider if: ?You experience any changes in the location or intensity of  your tremors. ?You start having a tremor after starting a new medicine. ?You have tremor with other symptoms, such as: ?Numbness. ?Tingling. ?Pain. ?Weakness. ?Your tremor gets worse. ?Your tremor interferes with your daily life. ?You feel down, blue, or sad for at least 2 weeks in a row. ?Worrying about your tremor and what other people think about you interferes  with your everyday life functions, including relationships, work, or school. ?Summary ?Essential tremor is a tremor without a known cause. Usually, it occurs when you are trying to perform an action. ?You are more likely to develop this condition if you have a family member with essential tremor. ?The main sign of a tremor is a rhythmic shaking of certain parts of your body that is uncontrolled and unintentional. ?Treatment for essential tremor depends on the severity of the condition. ?This information is not intended to replace advice given to you by your health care provider. Make sure you discuss any questions you have with your health care provider. ?Document Revised: 05/19/2020 Document Reviewed: 05/21/2020 ?Elsevier Patient Education ? [employer]. ? ?

## 2021-12-14 NOTE — Telephone Encounter (Signed)
Patient insurance requesting medical records and a Doctors note requesting Prolia. Fax # [phone] Attn: Case Management  ?

## 2021-12-14 NOTE — Telephone Encounter (Signed)
Last office note printed. Faxed to number provided below.  ?

## 2021-12-23 ENCOUNTER — Telehealth: Payer: Self-pay

## 2021-12-23 NOTE — Telephone Encounter (Signed)
Restart

## 2021-12-27 NOTE — Telephone Encounter (Signed)
Prolia VOB initiated via [email or web address]  restart  

## 2022-01-01 ENCOUNTER — Other Ambulatory Visit: Payer: Self-pay | Admitting: Neurology

## 2022-01-12 NOTE — Telephone Encounter (Signed)
PA# [id number] ?Valid 5/4//23-01/13/23 ? ? ? ? ? ? ?

## 2022-01-12 NOTE — Telephone Encounter (Signed)
Prior auth required for PROLIA  PA PROCESS DETAILS: Prior Authorization is required and can be obtained online at [email or web address] or faxed to [phone]. 

## 2022-01-12 NOTE — Telephone Encounter (Signed)
Pt ready for scheduling on or after 01/12/22 ? ?Out-of-pocket cost due at time of visit: $301 ? ?Primary: [employer]  ?Prolia co-insurance: 20% (approximately $276) ?Admin fee co-insurance: 20% (approximately $25) ? ?Secondary: n/a ?Prolia co-insurance:  ?Admin fee co-insurance:  ? ?Deductible: $2500 of $2500 met ? ?Prior Auth: APPROVED ?PA# [id number] ?Valid 5/4//23-01/13/23 ? ?** This summary of benefits is an estimation of the patient's out-of-pocket cost. Exact cost may vary based on individual plan coverage.  ? ?

## 2022-01-13 NOTE — Telephone Encounter (Signed)
Patient is aware of fee.  ?

## 2022-01-13 NOTE — Telephone Encounter (Signed)
Patient is scheduled for 01/17/22. ?

## 2022-01-17 ENCOUNTER — Ambulatory Visit: Payer: No Typology Code available for payment source

## 2022-01-17 DIAGNOSIS — M8080XS Other osteoporosis with current pathological fracture, unspecified site, sequela: Secondary | ICD-10-CM

## 2022-01-17 DIAGNOSIS — M810 Age-related osteoporosis without current pathological fracture: Secondary | ICD-10-CM | POA: Diagnosis not present

## 2022-01-17 MED ORDER — DENOSUMAB 60 MG/ML ~~LOC~~ SOSY
60.0000 mg | PREFILLED_SYRINGE | Freq: Once | SUBCUTANEOUS | Status: AC
  Administered 2022-01-17: 60 mg via SUBCUTANEOUS

## 2022-01-17 NOTE — Progress Notes (Signed)
Kathryn [name] 61 yr old female presents to office today for 6 month prolia injection per Billey Chang, MD. Administered DENOSUMAB 60 mg/mL subcutaneously left arm. Patient tolerated well.  ?

## 2022-02-13 NOTE — Telephone Encounter (Signed)
Last Prolia inj 01/17/22 Next Prolia inj due 07/21/22

## 2022-02-14 ENCOUNTER — Other Ambulatory Visit: Payer: Self-pay | Admitting: Neurology

## 2022-02-14 NOTE — Telephone Encounter (Signed)
No refills given unti seen 03/17/2022 with Dr.Adam Tomi Likens

## 2022-02-27 ENCOUNTER — Other Ambulatory Visit: Payer: Self-pay | Admitting: Neurology

## 2022-03-01 ENCOUNTER — Encounter: Payer: Self-pay | Admitting: Neurology

## 2022-03-01 NOTE — Telephone Encounter (Signed)
Given enough only until appt 03/27/2022 with Dr.Adam Tomi Likens

## 2022-03-03 ENCOUNTER — Other Ambulatory Visit: Payer: Self-pay | Admitting: Pharmacy Technician

## 2022-03-03 ENCOUNTER — Other Ambulatory Visit: Payer: Self-pay

## 2022-03-09 ENCOUNTER — Telehealth: Payer: Self-pay | Admitting: Pharmacy Technician

## 2022-03-09 NOTE — Telephone Encounter (Signed)
Auth Submission: no auth needed - increased dose '300mg'$  q65moPayer: [employer] golden Medication & CPT/J Code(s) submitted: Vyepti (Eptinezumab) [phone] of submission (phone, fax, portal): phone: [phone] type: Buy/Bill Units/visits requested: '300mg'$  q3 months Reference number: Laura-D 03/09/22 4:01 Approval from: 03/09/22 to 09/10/22

## 2022-03-11 ENCOUNTER — Other Ambulatory Visit: Payer: Self-pay | Admitting: Neurology

## 2022-03-16 ENCOUNTER — Telehealth: Payer: Self-pay | Admitting: Pharmacy Technician

## 2022-03-16 ENCOUNTER — Encounter: Payer: Self-pay | Admitting: Neurology

## 2022-03-16 NOTE — Telephone Encounter (Signed)
Vyepti co-pay card: approved  Medical benefit Id: [id number] Gr: [id number] Payor id: [place]: Id: [id number] Gr: [id number] [place]: [id number]  Phone: [phone] Fax: EOB/CLAIM: [phone]

## 2022-03-17 ENCOUNTER — Encounter: Payer: Self-pay | Admitting: Neurology

## 2022-03-17 ENCOUNTER — Other Ambulatory Visit: Payer: Self-pay | Admitting: Neurology

## 2022-03-17 MED ORDER — LEVETIRACETAM 500 MG PO TABS: 500.0000 mg | ORAL_TABLET | Freq: Two times a day (BID) | ORAL | 2 refills | Status: DC

## 2022-03-20 ENCOUNTER — Ambulatory Visit (INDEPENDENT_AMBULATORY_CARE_PROVIDER_SITE_OTHER): Payer: No Typology Code available for payment source

## 2022-03-20 VITALS — BP 132/78 | HR 74 | Temp 98.4°F | Resp 16 | Ht 63.0 in | Wt 187.6 lb

## 2022-03-20 DIAGNOSIS — G43009 Migraine without aura, not intractable, without status migrainosus: Secondary | ICD-10-CM | POA: Diagnosis not present

## 2022-03-20 MED ORDER — SODIUM CHLORIDE 0.9 % IV SOLN
300.0000 mg | Freq: Once | INTRAVENOUS | Status: AC
  Administered 2022-03-20: 300 mg via INTRAVENOUS
  Filled 2022-03-20: qty 3

## 2022-03-20 NOTE — Progress Notes (Signed)
Diagnosis: Migraines  Provider:  Marshell Garfinkel, MD  Procedure: Infusion  IV Type: Peripheral, IV Location: L Upper Arm  Vyepti (Eptinezumab-jjmr), Dose: 300 mg  Infusion Start Time: 8527  Infusion Stop Time: 1222  Post Infusion IV Care: Peripheral IV Discontinued  Discharge: Condition: Good, Destination: Home . AVS provided to patient.   Performed by:  Koren Shiver, RN

## 2022-03-21 ENCOUNTER — Telehealth: Payer: Self-pay

## 2022-03-21 NOTE — Telephone Encounter (Signed)
Letter received from [employer]: Patient approved for [employer] copay assistance program.

## 2022-03-22 ENCOUNTER — Other Ambulatory Visit: Payer: Self-pay | Admitting: Family Medicine

## 2022-03-24 ENCOUNTER — Other Ambulatory Visit: Payer: Self-pay | Admitting: Family Medicine
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
## 2022-03-27 ENCOUNTER — Encounter: Payer: Self-pay | Admitting: Neurology

## 2022-03-27 ENCOUNTER — Ambulatory Visit: Payer: No Typology Code available for payment source | Admitting: Neurology

## 2022-03-27 VITALS — BP 123/84 | HR 102 | Ht 63.0 in | Wt 186.0 lb

## 2022-03-27 DIAGNOSIS — G8929 Other chronic pain: Secondary | ICD-10-CM

## 2022-03-27 DIAGNOSIS — M545 Low back pain, unspecified: Secondary | ICD-10-CM

## 2022-03-27 DIAGNOSIS — G250 Essential tremor: Secondary | ICD-10-CM

## 2022-03-27 MED ORDER — CYCLOBENZAPRINE HCL 10 MG PO TABS: ORAL_TABLET | ORAL | 1 refills | Status: DC

## 2022-03-27 MED ORDER — SUMATRIPTAN SUCCINATE 100 MG PO TABS
ORAL_TABLET | ORAL | 5 refills | Status: DC
Start: 2022-03-27 — End: 2022-07-26

## 2022-03-27 MED ORDER — LEVETIRACETAM 500 MG PO TABS: 500.0000 mg | ORAL_TABLET | Freq: Two times a day (BID) | ORAL | 1 refills | Status: DC

## 2022-04-19 ENCOUNTER — Encounter (INDEPENDENT_AMBULATORY_CARE_PROVIDER_SITE_OTHER): Payer: Self-pay

## 2022-04-25 ENCOUNTER — Ambulatory Visit: Payer: No Typology Code available for payment source | Admitting: Physician Assistant

## 2022-05-20 ENCOUNTER — Encounter: Payer: Self-pay | Admitting: Neurology

## 2022-05-22 ENCOUNTER — Other Ambulatory Visit: Payer: Self-pay | Admitting: Family Medicine

## 2022-05-22 DIAGNOSIS — J4541 Moderate persistent asthma with (acute) exacerbation: Secondary | ICD-10-CM

## 2022-05-22 DIAGNOSIS — J301 Allergic rhinitis due to pollen: Secondary | ICD-10-CM

## 2022-06-05 ENCOUNTER — Encounter: Payer: Self-pay | Admitting: *Deleted

## 2022-06-20 ENCOUNTER — Other Ambulatory Visit: Payer: Self-pay | Admitting: Family Medicine

## 2022-06-20 ENCOUNTER — Ambulatory Visit: Payer: No Typology Code available for payment source

## 2022-06-20 ENCOUNTER — Encounter: Payer: Self-pay | Admitting: Neurology

## 2022-06-20 ENCOUNTER — Other Ambulatory Visit: Payer: Self-pay | Admitting: Neurology

## 2022-06-20 MED ORDER — SODIUM CHLORIDE 0.9 % IV SOLN
300.0000 mg | Freq: Once | INTRAVENOUS | Status: DC
  Filled 2022-06-20: qty 3

## 2022-06-21 NOTE — Telephone Encounter (Signed)
Prolia VOB initiated via [email or web address]  Last Prolia inj 01/17/22 Next Prolia inj due 07/21/22

## 2022-06-22 ENCOUNTER — Telehealth: Payer: Self-pay | Admitting: Pharmacy Technician

## 2022-06-22 NOTE — Telephone Encounter (Signed)
Patient will need to be enrolled in PAP (free drug) with [employer]. Vyepti does not require auth with her insurance but was denied due to pre-existing condition.  Request for enrollement has been sent to [employer] via email. Will f/u once I have a response.

## 2022-06-28 ENCOUNTER — Telehealth (INDEPENDENT_AMBULATORY_CARE_PROVIDER_SITE_OTHER): Payer: No Typology Code available for payment source | Admitting: Internal Medicine

## 2022-06-28 ENCOUNTER — Encounter: Payer: Self-pay | Admitting: Internal Medicine

## 2022-06-28 VITALS — BP 110/62 | HR 116 | Ht 63.0 in | Wt 177.0 lb

## 2022-06-28 DIAGNOSIS — E042 Nontoxic multinodular goiter: Secondary | ICD-10-CM | POA: Diagnosis not present

## 2022-06-28 DIAGNOSIS — E063 Autoimmune thyroiditis: Secondary | ICD-10-CM

## 2022-06-28 LAB — TSH: TSH: 1.25 u[IU]/mL (ref 0.35–5.50)

## 2022-06-28 LAB — T3, FREE: T3, Free: 3 pg/mL (ref 2.3–4.2)

## 2022-06-28 LAB — T4, FREE: Free T4: 0.87 ng/dL (ref 0.60–1.60)

## 2022-06-28 NOTE — Patient Instructions (Addendum)
Please stop at the lab.  Please come back for a follow-up appointment in 1 year.  
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
## 2022-07-02 NOTE — Telephone Encounter (Signed)
Pt ready for scheduling on or after 07/21/22   Out-of-pocket cost due at time of visit: $301   Primary: [employer] Golden Rule [place] co-insurance: 20% (approximately $276) Admin fee co-insurance: 20% (approximately $25)   Secondary: n/a Prolia co-insurance:  Admin fee co-insurance:    Deductible: $2500 of $2500 met   Prior Auth: APPROVED PA# [id number] Valid 5/4//23-01/13/23   ** This summary of benefits is an estimation of the patient's out-of-pocket cost. Exact cost may vary based on individual plan coverage.

## 2022-07-13 ENCOUNTER — Telehealth: Payer: Self-pay | Admitting: Neurology

## 2022-07-13 ENCOUNTER — Ambulatory Visit (INDEPENDENT_AMBULATORY_CARE_PROVIDER_SITE_OTHER): Payer: No Typology Code available for payment source

## 2022-07-13 DIAGNOSIS — G43709 Chronic migraine without aura, not intractable, without status migrainosus: Secondary | ICD-10-CM

## 2022-07-13 MED ORDER — DIPHENHYDRAMINE HCL 50 MG/ML IJ SOLN
50.0000 mg | Freq: Once | INTRAMUSCULAR | Status: AC
  Administered 2022-07-13: 25 mg via INTRAMUSCULAR

## 2022-07-13 MED ORDER — KETOROLAC TROMETHAMINE 60 MG/2ML IM SOLN
60.0000 mg | Freq: Once | INTRAMUSCULAR | Status: AC
  Administered 2022-07-13: 60 mg via INTRAMUSCULAR

## 2022-07-13 MED ORDER — DIPHENHYDRAMINE HCL 50 MG/ML IJ SOLN: 50.0000 mg | Freq: Once | INTRAMUSCULAR | Status: DC

## 2022-07-13 MED ORDER — METOCLOPRAMIDE HCL 5 MG/ML IJ SOLN
10.0000 mg | Freq: Once | INTRAMUSCULAR | Status: AC
  Administered 2022-07-13: 10 mg via INTRAMUSCULAR

## 2022-07-13 NOTE — Telephone Encounter (Signed)
Pt called in and left a message. She would like to see if she can come in today and get a shot of Toradol.

## 2022-07-13 NOTE — Telephone Encounter (Signed)
Yes, she may.  If she cannot afford Vyepti, then she should make a follow up appointment to discuss alternative option.   Patient advised of Cocktail approved. Please schedule for cocktail, and move F/u up.

## 2022-07-13 NOTE — Telephone Encounter (Signed)
How frequent or the headaches (on average, how many days a week/month are they occurring)?   How long do the headaches last?  3-4 days Verify what preventative medication and dose you are taking (e.g. topiramate, propranolol, amitriptyline, Emgality, etc)  vyepti, Can not afford right now.  Verify which rescue medication you are taking (triptan, Advil, Excedrin, Aleve, Ubrelvy, etc)  Sumatriptan How often are you taking pain relievers/analgesics/rescue mediction?  NONE. Please advised if patient may come to office to get a headache cocktail. Patient aware she will need a driver. If not only Toradol will be given.

## 2022-07-14 ENCOUNTER — Other Ambulatory Visit: Payer: Self-pay | Admitting: Neurology

## 2022-07-14 DIAGNOSIS — G8929 Other chronic pain: Secondary | ICD-10-CM
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
## 2022-07-19 ENCOUNTER — Ambulatory Visit: Payer: No Typology Code available for payment source | Admitting: Neurology
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
## 2022-07-25 ENCOUNTER — Ambulatory Visit: Payer: No Typology Code available for payment source

## 2022-07-25 NOTE — Telephone Encounter (Signed)
Vyepti PAP f/u: Spoke with in regards to financial documents needed to proceed with application process.   (Awaiting documents to be sent)  @ [facility] Please have Dr. Tomi Likens sign PAP forms and return to me. Forms have been faxed to: [phone] Thanks for your assistance. Kathryn [name]

## 2022-07-26 ENCOUNTER — Other Ambulatory Visit: Payer: Self-pay | Admitting: Neurology

## 2022-07-26 ENCOUNTER — Encounter: Payer: Self-pay | Admitting: Neurology

## 2022-07-26 ENCOUNTER — Ambulatory Visit: Payer: No Typology Code available for payment source | Admitting: Neurology

## 2022-07-26 ENCOUNTER — Encounter: Payer: Self-pay | Admitting: Family Medicine

## 2022-07-26 VITALS — BP 112/72 | HR 99 | Ht 63.0 in | Wt 176.6 lb

## 2022-07-26 DIAGNOSIS — G250 Essential tremor: Secondary | ICD-10-CM

## 2022-07-26 DIAGNOSIS — F331 Major depressive disorder, recurrent, moderate: Secondary | ICD-10-CM

## 2022-07-26 DIAGNOSIS — R569 Unspecified convulsions: Secondary | ICD-10-CM

## 2022-07-26 DIAGNOSIS — G43709 Chronic migraine without aura, not intractable, without status migrainosus: Secondary | ICD-10-CM | POA: Diagnosis not present

## 2022-07-26 MED ORDER — PROMETHAZINE HCL 50 MG PO TABS: 50.0000 mg | ORAL_TABLET | Freq: Four times a day (QID) | ORAL | 5 refills | Status: DC | PRN

## 2022-07-26 MED ORDER — NORTRIPTYLINE HCL 10 MG PO CAPS: 10.0000 mg | ORAL_CAPSULE | Freq: Every day | ORAL | 5 refills | Status: DC

## 2022-07-26 MED ORDER — CYCLOBENZAPRINE HCL 10 MG PO TABS: 10.0000 mg | ORAL_TABLET | Freq: Two times a day (BID) | ORAL | 1 refills | Status: DC | PRN

## 2022-07-26 MED ORDER — SUMATRIPTAN SUCCINATE 100 MG PO TABS: ORAL_TABLET | ORAL | 5 refills | Status: DC

## 2022-07-26 MED ORDER — LEVETIRACETAM 500 MG PO TABS: ORAL_TABLET | ORAL | 1 refills | Status: DC

## 2022-07-26 NOTE — Patient Instructions (Signed)
In addition to Vyepti, start nortriptyline '10mg'$  at bedtime.  We can increase dose in 4 weeks if no improvement (let me know).  Stop trazodone  Sumatriptan or BC as needed  Keppra '500mg'$  twice daily  Limit use of pain relievers to no more than 2 days out of week to prevent risk of rebound or medication-overuse headache.   Keep headache diary  Monitor tremor

## 2022-07-27 ENCOUNTER — Other Ambulatory Visit: Payer: Self-pay

## 2022-07-27 DIAGNOSIS — J301 Allergic rhinitis due to pollen: Secondary | ICD-10-CM

## 2022-07-27 DIAGNOSIS — J4541 Moderate persistent asthma with (acute) exacerbation: Secondary | ICD-10-CM

## 2022-07-27 MED ORDER — ALBUTEROL SULFATE HFA 108 (90 BASE) MCG/ACT IN AERS: 1.0000 | INHALATION_SPRAY | Freq: Four times a day (QID) | RESPIRATORY_TRACT | 1 refills | Status: AC | PRN

## 2022-07-27 MED ORDER — CETIRIZINE HCL 10 MG PO TABS: 10.0000 mg | ORAL_TABLET | Freq: Every day | ORAL | 0 refills | Status: AC

## 2022-07-27 MED ORDER — MONTELUKAST SODIUM 10 MG PO TABS: ORAL_TABLET | ORAL | 0 refills | Status: DC

## 2022-07-27 MED ORDER — ATORVASTATIN CALCIUM 10 MG PO TABS: ORAL_TABLET | ORAL | 0 refills | Status: DC

## 2022-07-28 MED ORDER — ALPRAZOLAM 0.25 MG PO TABS: 0.2500 mg | ORAL_TABLET | Freq: Every day | ORAL | 0 refills | Status: DC | PRN

## 2022-07-28 NOTE — Addendum Note (Signed)
Addended by: Billey Chang on: 07/28/2022 02:20 PM   Modules accepted: Orders

## 2022-07-31 NOTE — Telephone Encounter (Signed)
Forwarding to Rx prior auth team

## 2022-08-01 ENCOUNTER — Other Ambulatory Visit (HOSPITAL_COMMUNITY): Payer: Self-pay

## 2022-08-01 ENCOUNTER — Encounter: Payer: Self-pay | Admitting: Neurology

## 2022-08-01 ENCOUNTER — Ambulatory Visit (INDEPENDENT_AMBULATORY_CARE_PROVIDER_SITE_OTHER): Payer: No Typology Code available for payment source

## 2022-08-01 DIAGNOSIS — M19041 Primary osteoarthritis, right hand: Secondary | ICD-10-CM

## 2022-08-01 DIAGNOSIS — M818 Other osteoporosis without current pathological fracture: Secondary | ICD-10-CM

## 2022-08-01 DIAGNOSIS — Z8781 Personal history of (healed) traumatic fracture: Secondary | ICD-10-CM

## 2022-08-01 DIAGNOSIS — M810 Age-related osteoporosis without current pathological fracture: Secondary | ICD-10-CM

## 2022-08-01 DIAGNOSIS — M19042 Primary osteoarthritis, left hand: Secondary | ICD-10-CM

## 2022-08-01 MED ORDER — DENOSUMAB 60 MG/ML ~~LOC~~ SOSY
60.0000 mg | PREFILLED_SYRINGE | Freq: Once | SUBCUTANEOUS | Status: AC
  Administered 2022-08-01: 60 mg via SUBCUTANEOUS

## 2022-08-02 ENCOUNTER — Other Ambulatory Visit (HOSPITAL_COMMUNITY): Payer: Self-pay

## 2022-08-02 MED ORDER — SERTRALINE HCL 100 MG PO TABS: ORAL_TABLET | ORAL | 3 refills | Status: DC

## 2022-08-02 NOTE — Addendum Note (Signed)
Addended by: Billey Chang on: 08/02/2022 12:29 PM   Modules accepted: Orders

## 2022-08-02 NOTE — Telephone Encounter (Signed)
Last Prolia injection 08/01/22

## 2022-08-09 ENCOUNTER — Encounter: Payer: Self-pay | Admitting: Neurology

## 2022-08-09 NOTE — Progress Notes (Addendum)
Patient was given prolia injection in the posterior side of her right arm. Patient tolerated injection well. Joanette Gula, CMA

## 2022-08-24 ENCOUNTER — Encounter: Payer: Self-pay | Admitting: Neurology

## 2022-08-24 ENCOUNTER — Encounter: Payer: Self-pay | Admitting: *Deleted

## 2022-08-26 ENCOUNTER — Encounter: Payer: Self-pay | Admitting: Neurology

## 2022-09-27 ENCOUNTER — Ambulatory Visit: Payer: No Typology Code available for payment source | Admitting: Neurology

## 2022-10-29 IMAGING — US US THYROID
1 series · 14 of 25 positions shown · non-contrast
Comparison: 02/27/2019

02/17/2019

CLINICAL DATA: Thyroid nodule follow-up

EXAM:
THYROID ULTRASOUND
TECHNIQUE: Ultrasound examination of the thyroid gland and adjacent soft
tissues was performed.

[Series 1: us thyroid · 0.06mm/px · 14 of 60 slices shown]
[im 1/60]
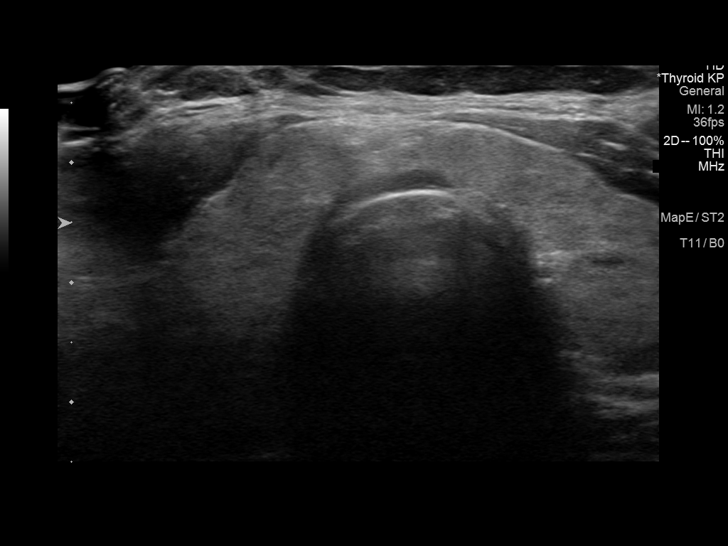
[im 5/60]
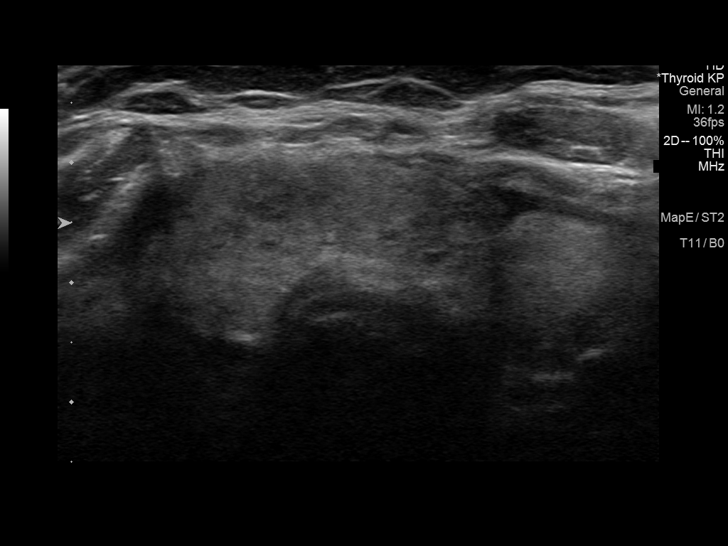
[im 10/60]
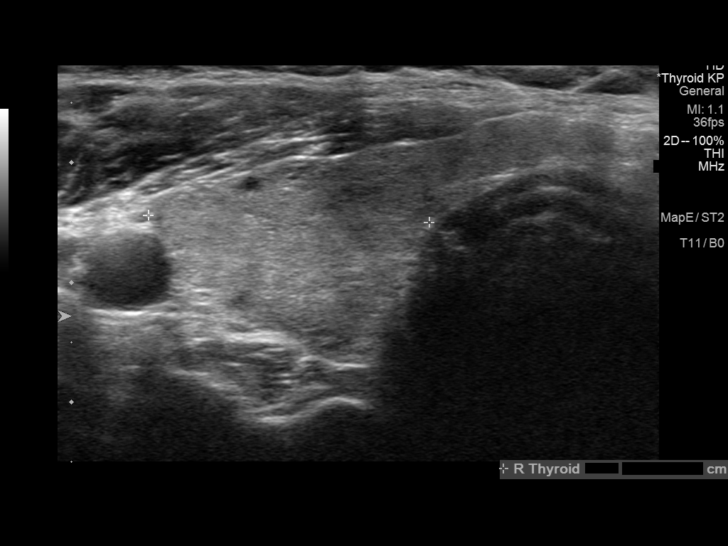
[im 15/60]
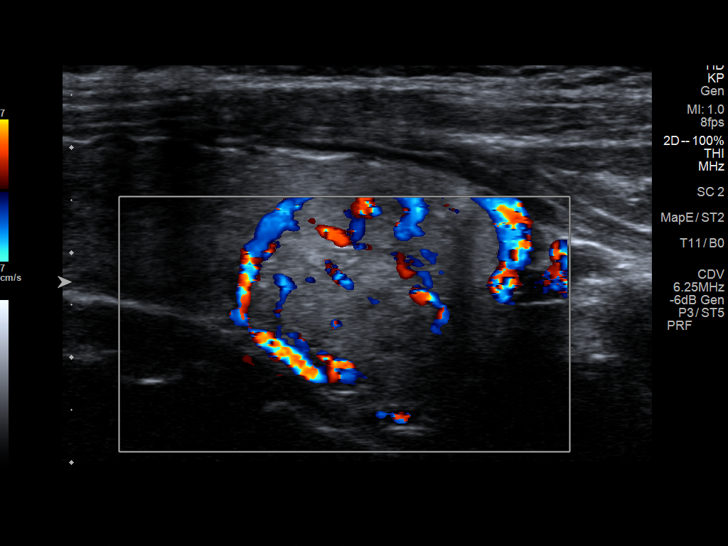
[im 20/60]
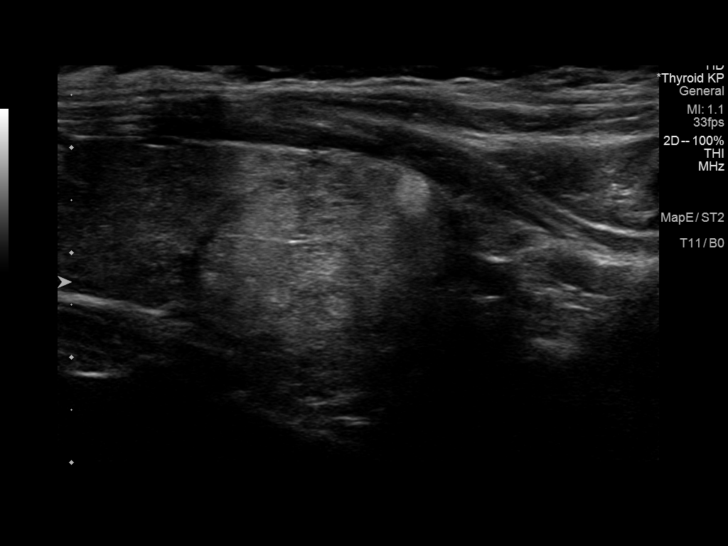
[im 23/60]
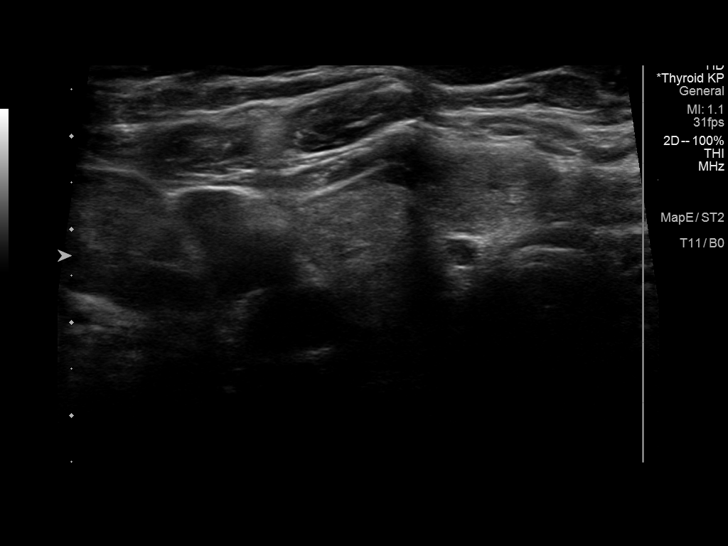
[im 28/60]
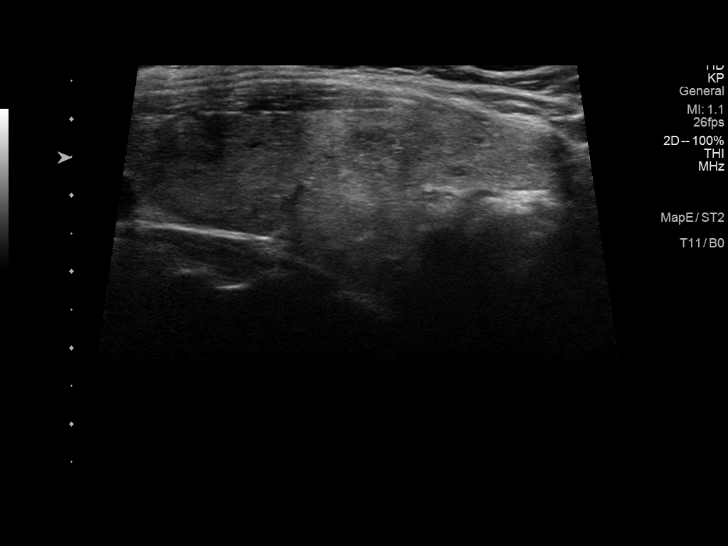
[im 32/60]
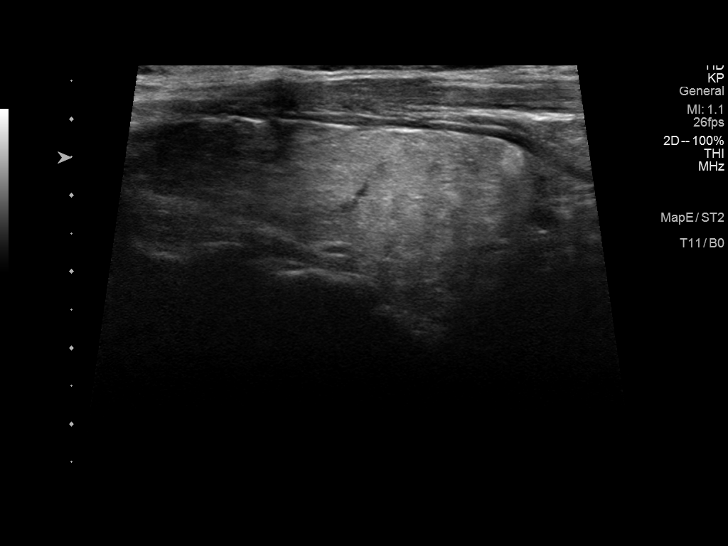
[im 37/60]
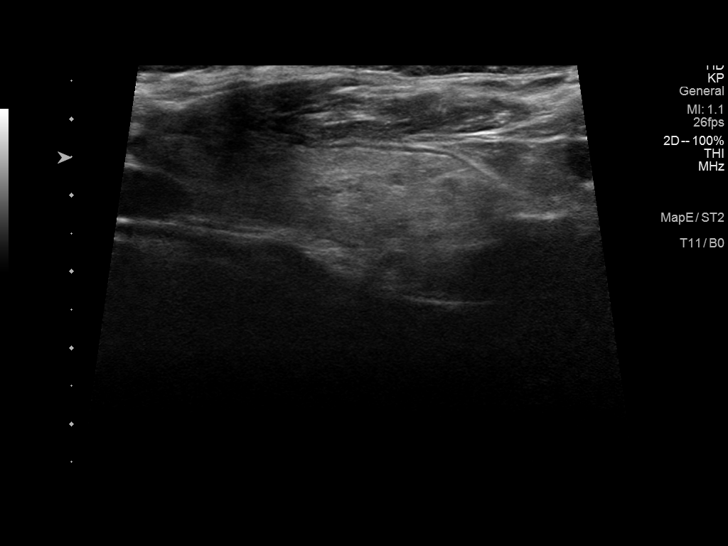
[im 40/60]
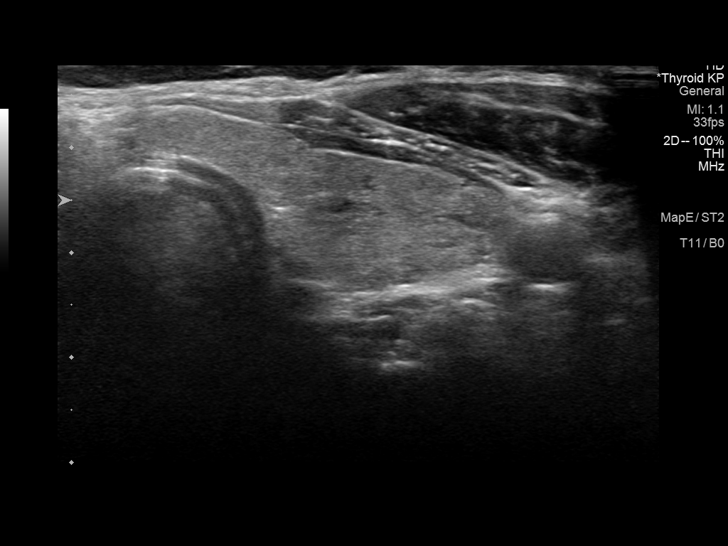
[im 45/60]
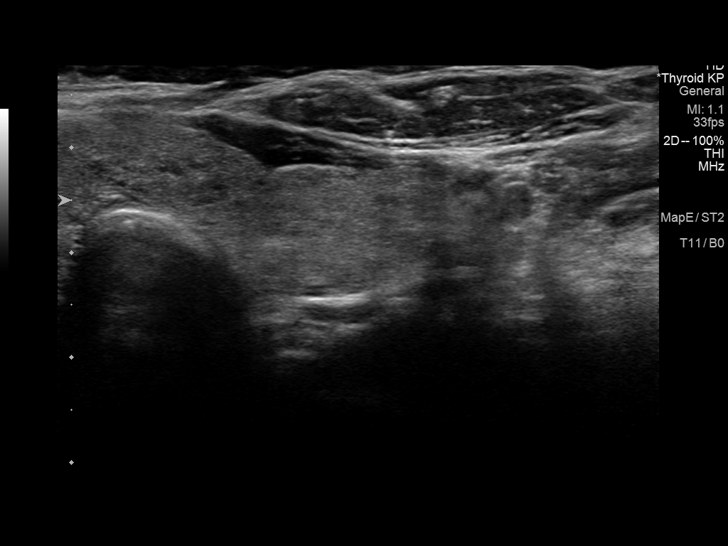
[im 50/60]
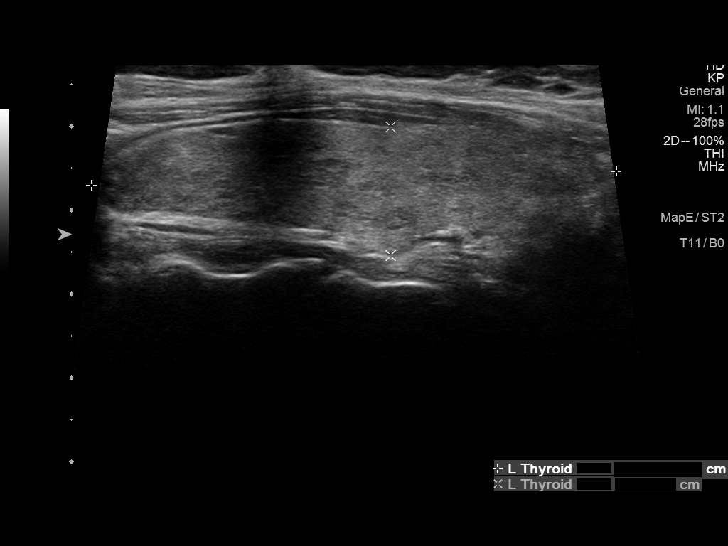
[im 55/60]
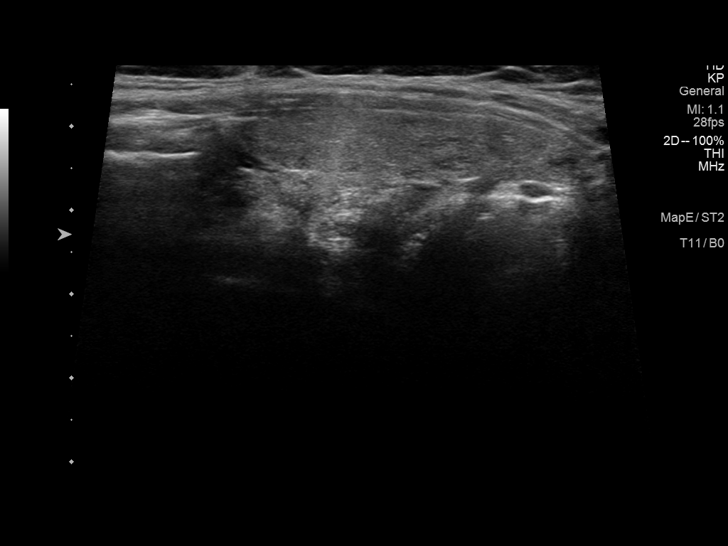
[im 60/60]
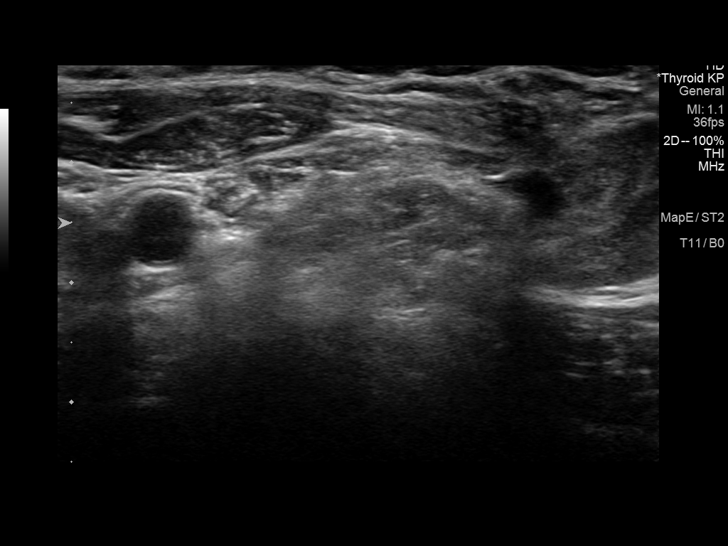

[14 of 25 positions shown; findings below may reference images not displayed]

FINDINGS: Parenchymal Echotexture: Mildly heterogeneous

Isthmus: 0.8 cm

Right lobe: 5.9 x 1.9 x 2.3 cm

Left lobe: 6.3 x 1.5 x 2.4 cm

_________________________________________________________

Estimated total number of nodules >/= 1 cm: 1

Number of spongiform nodules >/=  2 cm not described below (TR1): 0

Number of mixed cystic and solid nodules >/= 1.5 cm not described
below (TR2): 0

_________________________________________________________

Previously biopsied 1.6 x 1.1 x 1.1 cm right thyroid nodule is not
significantly changed in size since prior examination. Please
correlate with prior FNA results.

No new thyroid nodules are identified.
IMPRESSION: Previously biopsied right thyroid nodule is unchanged in size.
Please correlate with prior FNA results.

The above is in keeping with the ACR TI-RADS recommendations - [HOSPITAL] 7709;[DATE].

## 2022-11-23 ENCOUNTER — Other Ambulatory Visit: Payer: Self-pay | Admitting: Family Medicine

## 2022-11-23 ENCOUNTER — Encounter: Payer: Self-pay | Admitting: Family Medicine

## 2022-11-23 DIAGNOSIS — J4541 Moderate persistent asthma with (acute) exacerbation: Secondary | ICD-10-CM

## 2022-11-23 DIAGNOSIS — J301 Allergic rhinitis due to pollen: Secondary | ICD-10-CM

## 2022-11-24 ENCOUNTER — Other Ambulatory Visit: Payer: Self-pay

## 2022-11-24 DIAGNOSIS — F331 Major depressive disorder, recurrent, moderate: Secondary | ICD-10-CM

## 2022-11-24 MED ORDER — SERTRALINE HCL 100 MG PO TABS: ORAL_TABLET | ORAL | 3 refills | Status: DC

## 2022-12-28 ENCOUNTER — Encounter: Payer: Self-pay | Admitting: Neurology

## 2023-01-02 ENCOUNTER — Ambulatory Visit: Payer: Self-pay | Admitting: Neurology

## 2023-01-03 IMAGING — DX DG CHEST 2V
2 series · 2 of 2 positions shown · non-contrast
Comparison: Chest x-ray dated November 07, 2013.

CLINICAL DATA: Cough and shortness of breath for the past 5 days.

EXAM:
CHEST - 2 VIEW

[chest pa]
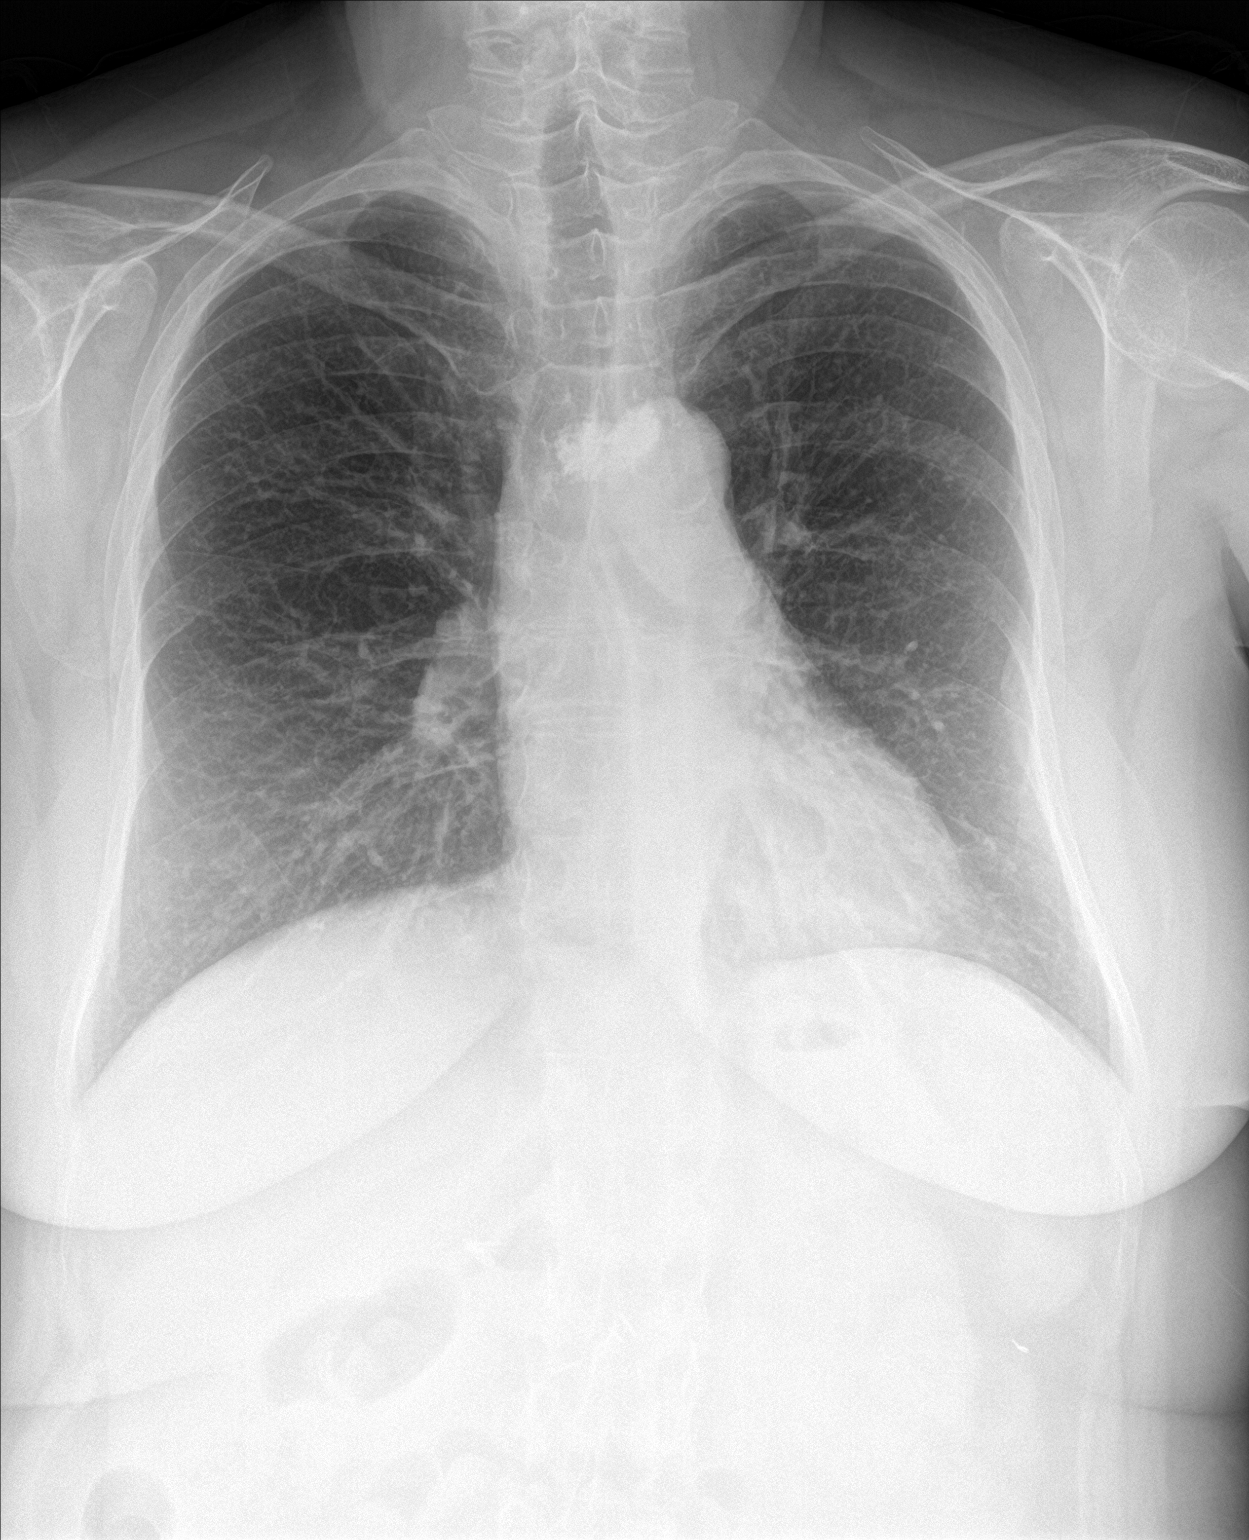

[chest lat]
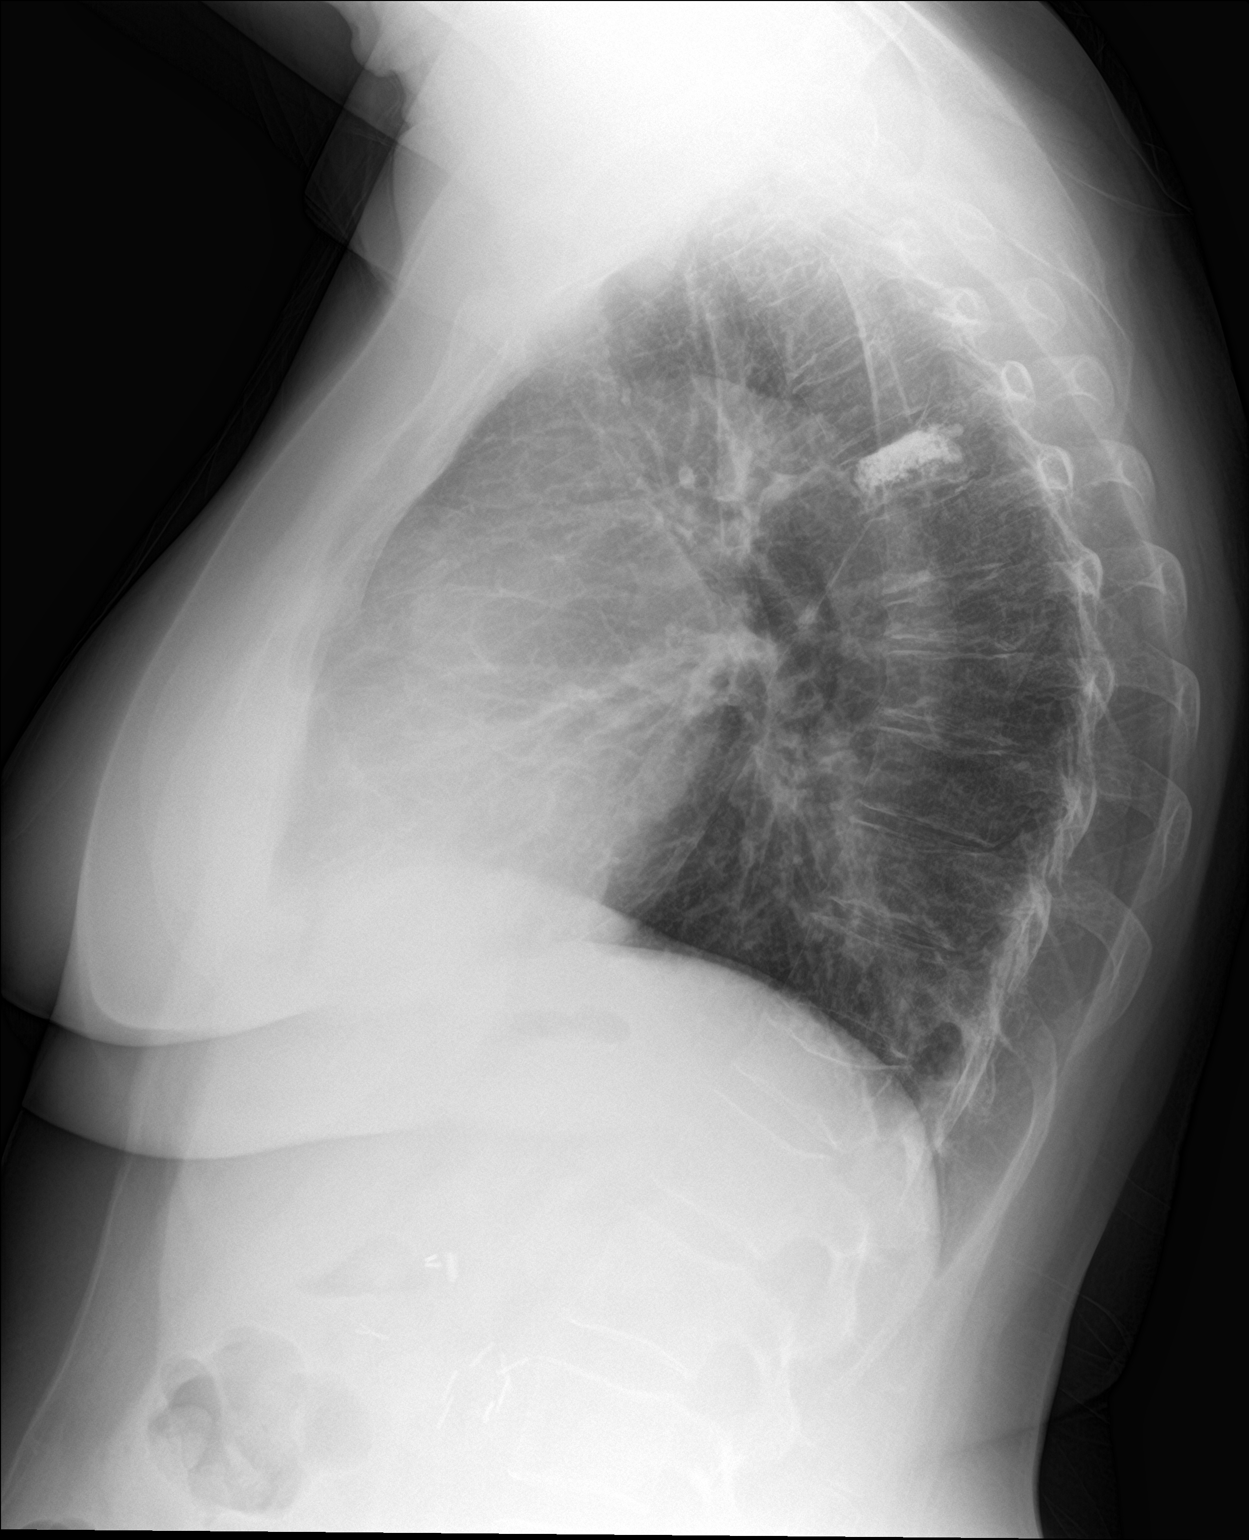

[2 of 2 positions shown; findings below may reference images not displayed]

FINDINGS: The heart size and mediastinal contours are within normal limits.
Normal pulmonary vascularity. No focal consolidation, pleural
effusion, or pneumothorax. No acute osseous abnormality. Chronic T5,
T7, and T12 compression deformities, with interval cement
augmentation at T5.
IMPRESSION: No active cardiopulmonary disease.

## 2023-01-10 ENCOUNTER — Telehealth: Payer: Self-pay | Admitting: Pharmacy Technician

## 2023-01-10 ENCOUNTER — Telehealth: Payer: Self-pay

## 2023-01-10 NOTE — Telephone Encounter (Signed)
[facility],  Fyi:  Patient has been denied for Vyepti (free drug) due to patient is over the national poverty level. Awaiting new therapy plan/treatment.

## 2023-01-10 NOTE — Telephone Encounter (Signed)
Patient scheduled for Follow up with Dr.Jaffe.

## 2023-01-10 NOTE — Telephone Encounter (Signed)
Per Selena Batten at [facility], patient does not qualify for free drug.  She was denied due to patient income is over the federal poverty level.   Please advise. Should we schedule a visit

## 2023-01-10 NOTE — Telephone Encounter (Signed)
Patient per [facility] schedule a visit to go over other options.
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
## 2023-02-19 ENCOUNTER — Encounter: Payer: Self-pay | Admitting: Neurology

## 2023-02-19 ENCOUNTER — Telehealth: Payer: Self-pay | Admitting: Neurology

## 2023-02-19 ENCOUNTER — Ambulatory Visit (INDEPENDENT_AMBULATORY_CARE_PROVIDER_SITE_OTHER): Payer: 59 | Admitting: Neurology

## 2023-02-19 VITALS — BP 124/75 | HR 110 | Ht 67.0 in | Wt 177.0 lb

## 2023-02-19 DIAGNOSIS — G250 Essential tremor: Secondary | ICD-10-CM

## 2023-02-19 DIAGNOSIS — R569 Unspecified convulsions: Secondary | ICD-10-CM

## 2023-02-19 DIAGNOSIS — G43009 Migraine without aura, not intractable, without status migrainosus: Secondary | ICD-10-CM | POA: Diagnosis not present

## 2023-02-19 DIAGNOSIS — F331 Major depressive disorder, recurrent, moderate: Secondary | ICD-10-CM | POA: Diagnosis not present

## 2023-02-19 MED ORDER — SUMATRIPTAN SUCCINATE 100 MG PO TABS: ORAL_TABLET | ORAL | 5 refills | Status: DC

## 2023-02-19 MED ORDER — CYCLOBENZAPRINE HCL 10 MG PO TABS: 10.0000 mg | ORAL_TABLET | Freq: Two times a day (BID) | ORAL | 1 refills | Status: DC | PRN

## 2023-02-19 MED ORDER — LEVETIRACETAM 500 MG PO TABS: ORAL_TABLET | ORAL | 1 refills | Status: DC

## 2023-02-19 NOTE — Patient Instructions (Signed)
Consider magnesium citrate 400mg  daily, riboflavin 400mg  daily and CoQ10 300mg  daily Sumatriptan or BC as needed but Limit use of pain relievers to no more than 2 days out of week to prevent risk of rebound or medication-overuse headache. Keep headache diary Keppra 500mg  in AM and 1000mg  in PM Cyclobenzaprine as needed Keep headache diary

## 2023-02-19 NOTE — Telephone Encounter (Signed)
Patient was seen today 6/10, she forgot to tell Dr.Jaffe  that she stopped using splenda and the drink mixes for water and it has helped her a lot

## 2023-03-23 ENCOUNTER — Encounter: Payer: Self-pay | Admitting: Neurology

## 2023-06-06 ENCOUNTER — Other Ambulatory Visit: Payer: Self-pay | Admitting: Family Medicine

## 2023-06-06 DIAGNOSIS — J4541 Moderate persistent asthma with (acute) exacerbation: Secondary | ICD-10-CM

## 2023-06-06 DIAGNOSIS — J301 Allergic rhinitis due to pollen: Secondary | ICD-10-CM

## 2023-06-07 MED ORDER — MONTELUKAST SODIUM 10 MG PO TABS: ORAL_TABLET | ORAL | 0 refills | Status: DC

## 2023-06-18 ENCOUNTER — Ambulatory Visit: Payer: Self-pay | Admitting: Neurology

## 2023-06-25 ENCOUNTER — Other Ambulatory Visit: Payer: Self-pay | Admitting: Family Medicine

## 2023-06-25 ENCOUNTER — Telehealth: Payer: Self-pay | Admitting: Internal Medicine

## 2023-06-25 DIAGNOSIS — Z1231 Encounter for screening mammogram for malignant neoplasm of breast: Secondary | ICD-10-CM

## 2023-06-25 NOTE — Telephone Encounter (Signed)
Patient was unable to get her scan that was ordered  before, asking if she can get it now. Please call

## 2023-06-25 NOTE — Telephone Encounter (Signed)
The order for the thyroid ultrasound was placed less than a year ago, can you please have patient call  [facility] and schedule this.  I do not think I need to place a new order.

## 2023-06-25 NOTE — Telephone Encounter (Signed)
Please Advise,  Scan was ordered: Order Date/Time Release Date/Time Start Date/Time End Date/Time  06/28/22 10:46 AM

## 2023-06-27 ENCOUNTER — Ambulatory Visit
Admission: RE | Admit: 2023-06-27 | Discharge: 2023-06-27 | Disposition: A | Discharge status: Home / Self Care | Payer: 59 | Source: Ambulatory Visit | Attending: Internal Medicine | Admitting: Internal Medicine

## 2023-06-27 DIAGNOSIS — E042 Nontoxic multinodular goiter: Secondary | ICD-10-CM

## 2023-06-29 ENCOUNTER — Ambulatory Visit: Payer: 59 | Admitting: Internal Medicine

## 2023-06-29 ENCOUNTER — Encounter: Payer: Self-pay | Admitting: Internal Medicine

## 2023-06-29 VITALS — BP 118/64 | HR 86 | Ht 67.0 in | Wt 176.2 lb

## 2023-06-29 DIAGNOSIS — E063 Autoimmune thyroiditis: Secondary | ICD-10-CM

## 2023-06-29 DIAGNOSIS — E042 Nontoxic multinodular goiter: Secondary | ICD-10-CM | POA: Diagnosis not present

## 2023-06-29 LAB — T4, FREE: Free T4: 0.71 ng/dL (ref 0.60–1.60)

## 2023-06-29 LAB — TSH: TSH: 1.19 u[IU]/mL (ref 0.35–5.50)

## 2023-06-29 LAB — T3, FREE: T3, Free: 2.9 pg/mL (ref 2.3–4.2)
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
## 2023-06-29 NOTE — Patient Instructions (Signed)
Please stop at the lab.  Please come back for a follow-up appointment in 1 year.  

## 2023-07-09 ENCOUNTER — Ambulatory Visit (INDEPENDENT_AMBULATORY_CARE_PROVIDER_SITE_OTHER): Payer: 59 | Admitting: Family Medicine

## 2023-07-09 VITALS — BP 100/70 | HR 83 | Temp 98.1°F | Ht 67.0 in | Wt 173.4 lb

## 2023-07-09 DIAGNOSIS — R519 Headache, unspecified: Secondary | ICD-10-CM

## 2023-07-09 DIAGNOSIS — J0100 Acute maxillary sinusitis, unspecified: Secondary | ICD-10-CM

## 2023-07-09 DIAGNOSIS — J3489 Other specified disorders of nose and nasal sinuses: Secondary | ICD-10-CM

## 2023-07-09 DIAGNOSIS — J029 Acute pharyngitis, unspecified: Secondary | ICD-10-CM

## 2023-07-09 MED ORDER — AMOXICILLIN-POT CLAVULANATE 875-125 MG PO TABS: 1.0000 | ORAL_TABLET | Freq: Two times a day (BID) | ORAL | 0 refills | Status: AC
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
## 2023-07-10 LAB — POCT INFLUENZA A/B
Influenza A, POC: NEGATIVE
Influenza B, POC: NEGATIVE

## 2023-07-10 LAB — POC COVID19 BINAXNOW: SARS Coronavirus 2 Ag: NEGATIVE

## 2023-07-17 ENCOUNTER — Ambulatory Visit
Admission: RE | Admit: 2023-07-17 | Discharge: 2023-07-17 | Disposition: A | Discharge status: Home / Self Care | Payer: 59 | Source: Ambulatory Visit | Attending: Family Medicine | Admitting: Family Medicine

## 2023-07-17 DIAGNOSIS — Z1231 Encounter for screening mammogram for malignant neoplasm of breast: Secondary | ICD-10-CM

## 2023-07-25 ENCOUNTER — Other Ambulatory Visit: Payer: Self-pay | Admitting: Family Medicine

## 2023-07-25 DIAGNOSIS — J301 Allergic rhinitis due to pollen: Secondary | ICD-10-CM

## 2023-07-25 DIAGNOSIS — J4541 Moderate persistent asthma with (acute) exacerbation: Secondary | ICD-10-CM

## 2023-07-26 ENCOUNTER — Other Ambulatory Visit (HOSPITAL_COMMUNITY): Payer: Self-pay

## 2023-07-26 ENCOUNTER — Other Ambulatory Visit: Payer: Self-pay | Admitting: Family Medicine

## 2023-07-26 ENCOUNTER — Encounter: Payer: Self-pay | Admitting: Neurology

## 2023-07-26 DIAGNOSIS — J4541 Moderate persistent asthma with (acute) exacerbation: Secondary | ICD-10-CM

## 2023-07-26 DIAGNOSIS — J301 Allergic rhinitis due to pollen: Secondary | ICD-10-CM

## 2023-07-26 MED ORDER — MONTELUKAST SODIUM 10 MG PO TABS
ORAL_TABLET | ORAL | 0 refills | Status: DC
  Filled 2023-07-26: qty 30, fill #0

## 2023-07-26 MED ORDER — MONTELUKAST SODIUM 10 MG PO TABS
ORAL_TABLET | ORAL | 0 refills | Status: DC
  Filled 2023-07-26: qty 30, 30d supply, fill #0

## 2023-07-26 NOTE — Telephone Encounter (Signed)
Per request below patient requested a 90 day supply be sent to [facility]. Patient has been scheduled for f/u on 11/19 @ 9 am.

## 2023-07-31 ENCOUNTER — Ambulatory Visit (INDEPENDENT_AMBULATORY_CARE_PROVIDER_SITE_OTHER): Payer: 59 | Admitting: Family Medicine

## 2023-07-31 ENCOUNTER — Other Ambulatory Visit (HOSPITAL_COMMUNITY): Payer: Self-pay

## 2023-07-31 ENCOUNTER — Telehealth: Payer: Self-pay

## 2023-07-31 ENCOUNTER — Encounter: Payer: Self-pay | Admitting: Family Medicine

## 2023-07-31 VITALS — BP 116/80 | HR 85 | Temp 98.0°F | Ht 67.0 in | Wt 174.4 lb

## 2023-07-31 DIAGNOSIS — M816 Localized osteoporosis [Lequesne]: Secondary | ICD-10-CM | POA: Diagnosis not present

## 2023-07-31 DIAGNOSIS — E782 Mixed hyperlipidemia: Secondary | ICD-10-CM

## 2023-07-31 DIAGNOSIS — J4541 Moderate persistent asthma with (acute) exacerbation: Secondary | ICD-10-CM

## 2023-07-31 DIAGNOSIS — F411 Generalized anxiety disorder: Secondary | ICD-10-CM

## 2023-07-31 DIAGNOSIS — J301 Allergic rhinitis due to pollen: Secondary | ICD-10-CM

## 2023-07-31 DIAGNOSIS — Z23 Encounter for immunization: Secondary | ICD-10-CM

## 2023-07-31 DIAGNOSIS — F339 Major depressive disorder, recurrent, unspecified: Secondary | ICD-10-CM

## 2023-07-31 DIAGNOSIS — E063 Autoimmune thyroiditis: Secondary | ICD-10-CM

## 2023-07-31 DIAGNOSIS — F331 Major depressive disorder, recurrent, moderate: Secondary | ICD-10-CM

## 2023-07-31 DIAGNOSIS — E042 Nontoxic multinodular goiter: Secondary | ICD-10-CM

## 2023-07-31 DIAGNOSIS — G2581 Restless legs syndrome: Secondary | ICD-10-CM

## 2023-07-31 MED ORDER — SERTRALINE HCL 100 MG PO TABS
200.0000 mg | ORAL_TABLET | Freq: Every day | ORAL | Status: DC
Start: 2023-07-31 — End: 2024-03-03

## 2023-07-31 MED ORDER — MONTELUKAST SODIUM 10 MG PO TABS: 10.0000 mg | ORAL_TABLET | Freq: Every day | ORAL | 3 refills | Status: DC
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
## 2023-07-31 NOTE — Telephone Encounter (Signed)
Prolia VOB initiated via [email or web address]  Last Prolia injection: 08/02/22

## 2023-07-31 NOTE — Telephone Encounter (Signed)
Pt was in office today and is needing to get started with prolia. Please Start PA for this.   Thank you,  Christy Gentles

## 2023-08-01 LAB — IRON,TIBC AND FERRITIN PANEL
%SAT: 26 % (ref 16–45)
Ferritin: 37 ng/mL (ref 16–288)
Iron: 80 ug/dL (ref 45–160)
TIBC: 309 ug/dL (ref 250–450)

## 2023-08-01 LAB — COMPREHENSIVE METABOLIC PANEL
AG Ratio: 1.2 (calc) (ref 1.0–2.5)
ALT: 14 U/L (ref 6–29)
AST: 18 U/L (ref 10–35)
Albumin: 4.4 g/dL (ref 3.6–5.1)
Alkaline phosphatase (APISO): 46 U/L (ref 37–153)
BUN/Creatinine Ratio: 31 (calc) — ABNORMAL HIGH (ref 6–22)
BUN: 26 mg/dL — ABNORMAL HIGH (ref 7–25)
CO2: 24 mmol/L (ref 20–32)
Calcium: 9.5 mg/dL (ref 8.6–10.4)
Chloride: 107 mmol/L (ref 98–110)
Creat: 0.83 mg/dL (ref 0.50–1.05)
Globulin: 3.6 g/dL (ref 1.9–3.7)
Glucose, Bld: 104 mg/dL — ABNORMAL HIGH (ref 65–99)
Potassium: 4.8 mmol/L (ref 3.5–5.3)
Sodium: 142 mmol/L (ref 135–146)
Total Bilirubin: 0.3 mg/dL (ref 0.2–1.2)
Total Protein: 8 g/dL (ref 6.1–8.1)

## 2023-08-01 LAB — CBC WITH DIFFERENTIAL/PLATELET
Absolute Lymphocytes: 1638 {cells}/uL (ref 850–3900)
Absolute Monocytes: 339 {cells}/uL (ref 200–950)
Basophils Absolute: 20 {cells}/uL (ref 0–200)
Basophils Relative: 0.5 %
Eosinophils Absolute: 187 {cells}/uL (ref 15–500)
Eosinophils Relative: 4.8 %
HCT: 40.5 % (ref 35.0–45.0)
Hemoglobin: 13.3 g/dL (ref 11.7–15.5)
MCH: 31.4 pg (ref 27.0–33.0)
MCHC: 32.8 g/dL (ref 32.0–36.0)
MCV: 95.5 fL (ref 80.0–100.0)
MPV: 9.5 fL (ref 7.5–12.5)
Monocytes Relative: 8.7 %
Neutro Abs: 1716 {cells}/uL (ref 1500–7800)
Neutrophils Relative %: 44 %
Platelets: 235 10*3/uL (ref 140–400)
RBC: 4.24 10*6/uL (ref 3.80–5.10)
RDW: 12.9 % (ref 11.0–15.0)
Total Lymphocyte: 42 %
WBC: 3.9 10*3/uL (ref 3.8–10.8)

## 2023-08-01 LAB — LIPID PANEL
Cholesterol: 253 mg/dL — ABNORMAL HIGH (ref <200)
HDL: 78 mg/dL (ref >=50)
LDL Cholesterol (Calc): 157 mg/dL — ABNORMAL HIGH
Non-HDL Cholesterol (Calc): 175 mg/dL — ABNORMAL HIGH (ref <130)
Total CHOL/HDL Ratio: 3.2 (calc) (ref <5.0)
Triglycerides: 76 mg/dL (ref <150)

## 2023-08-01 LAB — TSH: TSH: 1.63 m[IU]/L (ref 0.40–4.50)

## 2023-08-01 MED ORDER — DENOSUMAB 60 MG/ML ~~LOC~~ SOSY
60.0000 mg | PREFILLED_SYRINGE | Freq: Once | SUBCUTANEOUS | Status: AC
Start: 2023-08-15 — End: ?

## 2023-08-01 NOTE — Addendum Note (Signed)
Addended by: Trudie Reed A on: 08/01/2023 11:41 AM   Modules accepted: Orders

## 2023-08-02 NOTE — Progress Notes (Signed)
Labs reviewed.  The 10-year ASCVD risk score (Arnett DK, et al., 2019) is: 2.9%   Values used to calculate the score:     Age: 62 years     Sex: Female     Is Non-Hispanic African American: No     Diabetic: No     Tobacco smoker: No     Systolic Blood Pressure: 116 mmHg     Is BP treated: No     HDL Cholesterol: 78 mg/dL     Total Cholesterol: 253 mg/dL

## 2023-08-03 ENCOUNTER — Other Ambulatory Visit (HOSPITAL_COMMUNITY): Payer: Self-pay

## 2023-08-03 NOTE — Telephone Encounter (Signed)
Pharmacy Patient Advocate Encounter  Received notification from  [facility]  that Prior Authorization for PROLIA has been APPROVED from 08/03/23 to 08/02/24   PA #/Case ID/Reference #: [id number]

## 2023-08-06 ENCOUNTER — Other Ambulatory Visit (HOSPITAL_COMMUNITY): Payer: Self-pay

## 2023-08-06 NOTE — Telephone Encounter (Signed)
Pt ready for scheduling for Prolia on or after : 08/06/23  Out-of-pocket cost due at time of visit: $~803  Primary: [employer] Prolia co-insurance: 45% Admin fee co-insurance: 45%  Secondary: N/A Prolia co-insurance:  Admin fee co-insurance:   Medical Benefit Details: Date Benefits were checked: 08/01/2023 Deductible: no/ Coinsurance: 45%/ Admin Fee: 45%  Prior Auth: Approved PA# [id number]  Expiration Date: 08/03/23 to 08/02/24  # of doses approved:  Pharmacy benefit: Copay $-- If patient wants fill through the pharmacy benefit please send prescription to:  -- , and include estimated need by date in rx notes. Pharmacy will ship medication directly to the office.  Patient  is  eligible for Prolia Copay Card. Copay Card can make patient's cost as little as $25. Link to apply: [email or web address]  ** This summary of benefits is an estimation of the patient's out-of-pocket cost. Exact cost may very based on individual plan coverage.
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
## 2023-08-29 ENCOUNTER — Encounter: Payer: Self-pay | Admitting: Neurology

## 2023-08-29 ENCOUNTER — Other Ambulatory Visit (HOSPITAL_COMMUNITY): Payer: Self-pay

## 2023-08-29 ENCOUNTER — Telehealth: Payer: Self-pay | Admitting: Pharmacy Technician

## 2023-08-29 ENCOUNTER — Telehealth: Payer: Self-pay

## 2023-08-29 ENCOUNTER — Ambulatory Visit: Payer: 59 | Admitting: Neurology

## 2023-08-29 VITALS — BP 116/73 | HR 113 | Ht 63.0 in | Wt 181.6 lb

## 2023-08-29 DIAGNOSIS — G43709 Chronic migraine without aura, not intractable, without status migrainosus: Secondary | ICD-10-CM | POA: Diagnosis not present

## 2023-08-29 DIAGNOSIS — R569 Unspecified convulsions: Secondary | ICD-10-CM

## 2023-08-29 DIAGNOSIS — G250 Essential tremor: Secondary | ICD-10-CM | POA: Diagnosis not present

## 2023-08-29 MED ORDER — CYCLOBENZAPRINE HCL 10 MG PO TABS: 10.0000 mg | ORAL_TABLET | Freq: Two times a day (BID) | ORAL | 1 refills | Status: DC | PRN

## 2023-08-29 MED ORDER — PROPRANOLOL HCL 20 MG PO TABS: 20.0000 mg | ORAL_TABLET | Freq: Two times a day (BID) | ORAL | 1 refills | Status: DC

## 2023-08-29 MED ORDER — SUMATRIPTAN SUCCINATE 100 MG PO TABS: ORAL_TABLET | ORAL | 5 refills | Status: DC

## 2023-08-29 MED ORDER — LEVETIRACETAM 500 MG PO TABS: ORAL_TABLET | ORAL | 1 refills | Status: DC

## 2023-08-29 MED ORDER — PROMETHAZINE HCL 50 MG PO TABS: 50.0000 mg | ORAL_TABLET | Freq: Four times a day (QID) | ORAL | 5 refills | Status: DC | PRN

## 2023-08-29 NOTE — Patient Instructions (Addendum)
Plan to start Botox For tremor, start propranolol 20mg  twice daily Sumatriptan or BC for migraine attack.  Limit use to no more than 10 days out of month to prevent rebound headache, promethazine for nausea Cyclobenzaprine for neck pain/muscle spasms Keppra 500mg  twice daily

## 2023-08-29 NOTE — Telephone Encounter (Signed)
PA has been submitted, and telephone encounter has been created. 

## 2023-08-29 NOTE — Telephone Encounter (Signed)
Patient seen in office today, Per Dr. Everlena Cooper patient to start Botox 200 units.   Pa team please start a PA for Botox 200 units every 90 days.

## 2023-08-29 NOTE — Telephone Encounter (Signed)
Pharmacy Patient Advocate Encounter  [employer] verification has been submitted. Benefit Verification #:  [id number]  Pharmacy PA has been submitted for BOTOX 100u/200u: 200u via CoverMyMeds. INSURANCE: [employer] DATE SUBMITTED: 12.18.24 KEY: [id number] Status is pending

## 2023-08-31 NOTE — Telephone Encounter (Signed)
Pharmacy Patient Advocate Encounter  Received notification from [facility] that Prior Authorization for BOTOX 200 has been CANCELLED due to    PA #/Case ID/Reference #: [id number]

## 2023-09-07 NOTE — Telephone Encounter (Signed)
Pharmacy Patient Advocate Encounter- Botox BIV-Medical Benefit:  Buy/Bill J code: Z6109 CPT code: 60454 Dx Code: G43.709  PA was submitted to 12.27.24 and has been approved through: 12.27.25 Authorization# [id number]  Please send prescription to Specialty Pharmacy: [facility]: [phone]  Estimated Patient cost is: UNKNOWN

## 2023-09-11 NOTE — Telephone Encounter (Signed)
Patient advised. Buy/bill

## 2023-09-28 ENCOUNTER — Ambulatory Visit: Payer: 59 | Admitting: Neurology

## 2023-09-28 DIAGNOSIS — G43709 Chronic migraine without aura, not intractable, without status migrainosus: Secondary | ICD-10-CM

## 2023-09-28 MED ORDER — ONABOTULINUMTOXINA 100 UNITS IJ SOLR
200.0000 [IU] | Freq: Once | INTRAMUSCULAR | Status: AC
  Administered 2023-09-28: 155 [IU] via INTRAMUSCULAR

## 2023-09-28 NOTE — Progress Notes (Signed)
[facility]  ° °Procedure Note Botox ° °Attending: Dr.   ° °Preoperative Diagnosis(es): Chronic migraine ° °Consent obtained from: The patient °Benefits discussed included, but were not limited to decreased muscle tightness, increased joint range of motion, and decreased pain.  Risk discussed included, but were not limited pain and discomfort, bleeding, bruising, excessive weakness, venous thrombosis, muscle atrophy and dysphagia.  Anticipated outcomes of the procedure as well as he risks and benefits of the alternatives to the procedure, and the roles and tasks of the personnel to be involved, were discussed with the patient, and the patient consents to the procedure and agrees to proceed. A copy of the patient medication guide was given to the patient which explains the blackbox warning. ° °Patients identity and treatment sites confirmed Yes.  . ° °Details of Procedure: °Skin was cleaned with alcohol. Prior to injection, the needle plunger was aspirated to make sure the needle was not within a blood vessel.  There was no blood retrieved on aspiration.   ° °Following is a summary of the muscles injected  And the amount of Botulinum toxin used: ° °Dilution °200 units of Botox was reconstituted with 4 ml of preservative free normal saline. °Time of reconstitution: At the time of the office visit (<30 minutes prior to injection)  ° °Injections  °155 total units of Botox was injected with a 30 gauge needle. ° °Injection Sites: °L occipitalis: 15 units- 3 sites  °R occiptalis: 15 units- 3 sites ° °L upper trapezius: 15 units- 3 sites °R upper trapezius: 15 units- 3 sits          °L paraspinal: 10 units- 2 sites °R paraspinal: 10 units- 2 sites ° °Face °L frontalis(2 injection sites):10 units   °R frontalis(2 injection sites):10 units         °L corrugator: 5 units   °R corrugator: 5 units           °Procerus: 5 units   °L temporalis: 20 units °R temporalis: 20 units  ° °Agent:  °200 units of botulinum Type  A (Onobotulinum Toxin type A) was reconstituted with 4 ml of preservative free normal saline.  °Time of reconstitution: At the time of the office visit (<30 minutes prior to injection)  ° ° ° Total injected (Units):  155 ° Total wasted (Units):  45 ° °Patient tolerated procedure well without complications.   °Reinjection is anticipated in 3 months. ° ° °

## 2024-01-04 ENCOUNTER — Ambulatory Visit: Payer: 59 | Admitting: Neurology

## 2024-01-04 DIAGNOSIS — G43709 Chronic migraine without aura, not intractable, without status migrainosus: Secondary | ICD-10-CM

## 2024-01-04 MED ORDER — ONABOTULINUMTOXINA 100 UNITS IJ SOLR
200.0000 [IU] | Freq: Once | INTRAMUSCULAR | Status: AC
Start: 2024-01-04 — End: 2024-01-04
  Administered 2024-01-04: 155 [IU] via INTRAMUSCULAR

## 2024-01-04 NOTE — Progress Notes (Signed)

## 2024-01-29 ENCOUNTER — Encounter: Payer: Self-pay | Admitting: Neurology

## 2024-01-29 ENCOUNTER — Encounter: Payer: 59 | Admitting: Family Medicine

## 2024-02-29 ENCOUNTER — Other Ambulatory Visit: Payer: Self-pay | Admitting: Neurology

## 2024-03-03 ENCOUNTER — Encounter: Payer: Self-pay | Admitting: Family Medicine

## 2024-03-03 DIAGNOSIS — F331 Major depressive disorder, recurrent, moderate: Secondary | ICD-10-CM

## 2024-03-03 MED ORDER — SERTRALINE HCL 100 MG PO TABS: 200.0000 mg | ORAL_TABLET | Freq: Every day | ORAL | 3 refills | Status: AC

## 2024-03-10 ENCOUNTER — Ambulatory Visit (INDEPENDENT_AMBULATORY_CARE_PROVIDER_SITE_OTHER): Admitting: Family Medicine

## 2024-03-10 ENCOUNTER — Encounter: Payer: Self-pay | Admitting: Family Medicine

## 2024-03-10 VITALS — BP 120/84 | HR 77 | Temp 98.6°F | Ht 63.0 in | Wt 206.2 lb

## 2024-03-10 DIAGNOSIS — Z0001 Encounter for general adult medical examination with abnormal findings: Secondary | ICD-10-CM | POA: Diagnosis not present

## 2024-03-10 DIAGNOSIS — E782 Mixed hyperlipidemia: Secondary | ICD-10-CM | POA: Diagnosis not present

## 2024-03-10 DIAGNOSIS — J4540 Moderate persistent asthma, uncomplicated: Secondary | ICD-10-CM

## 2024-03-10 DIAGNOSIS — F5104 Psychophysiologic insomnia: Secondary | ICD-10-CM | POA: Diagnosis not present

## 2024-03-10 DIAGNOSIS — G2581 Restless legs syndrome: Secondary | ICD-10-CM

## 2024-03-10 DIAGNOSIS — Z23 Encounter for immunization: Secondary | ICD-10-CM

## 2024-03-10 DIAGNOSIS — Z780 Asymptomatic menopausal state: Secondary | ICD-10-CM | POA: Diagnosis not present

## 2024-03-10 DIAGNOSIS — F411 Generalized anxiety disorder: Secondary | ICD-10-CM

## 2024-03-10 DIAGNOSIS — M816 Localized osteoporosis [Lequesne]: Secondary | ICD-10-CM | POA: Diagnosis not present

## 2024-03-10 DIAGNOSIS — F339 Major depressive disorder, recurrent, unspecified: Secondary | ICD-10-CM

## 2024-03-10 DIAGNOSIS — E063 Autoimmune thyroiditis: Secondary | ICD-10-CM | POA: Diagnosis not present

## 2024-03-10 LAB — CBC WITH DIFFERENTIAL/PLATELET
Basophils Absolute: 0.1 10*3/uL (ref 0.0–0.1)
Basophils Relative: 1.2 % (ref 0.0–3.0)
Eosinophils Absolute: 0.3 10*3/uL (ref 0.0–0.7)
Eosinophils Relative: 5.9 % — ABNORMAL HIGH (ref 0.0–5.0)
HCT: 39.7 % (ref 36.0–46.0)
Hemoglobin: 13.5 g/dL (ref 12.0–15.0)
Lymphocytes Relative: 32.7 % (ref 12.0–46.0)
Lymphs Abs: 1.7 10*3/uL (ref 0.7–4.0)
MCHC: 33.9 g/dL (ref 30.0–36.0)
MCV: 92.6 fl (ref 78.0–100.0)
Monocytes Absolute: 0.4 10*3/uL (ref 0.1–1.0)
Monocytes Relative: 8.1 % (ref 3.0–12.0)
Neutro Abs: 2.7 10*3/uL (ref 1.4–7.7)
Neutrophils Relative %: 52.1 % (ref 43.0–77.0)
Platelets: 276 10*3/uL (ref 150.0–400.0)
RBC: 4.29 Mil/uL (ref 3.87–5.11)
RDW: 14.1 % (ref 11.5–15.5)
WBC: 5.3 10*3/uL (ref 4.0–10.5)

## 2024-03-10 LAB — COMPREHENSIVE METABOLIC PANEL WITH GFR
ALT: 31 U/L (ref 0–35)
AST: 25 U/L (ref 0–37)
Albumin: 4.4 g/dL (ref 3.5–5.2)
Alkaline Phosphatase: 56 U/L (ref 39–117)
BUN: 20 mg/dL (ref 6–23)
CO2: 29 meq/L (ref 19–32)
Calcium: 9.6 mg/dL (ref 8.4–10.5)
Chloride: 101 meq/L (ref 96–112)
Creatinine, Ser: 0.79 mg/dL (ref 0.40–1.20)
GFR: 80.59 mL/min (ref >60.00)
Glucose, Bld: 103 mg/dL — ABNORMAL HIGH (ref 70–99)
Potassium: 4.7 meq/L (ref 3.5–5.1)
Sodium: 139 meq/L (ref 135–145)
Total Bilirubin: 0.4 mg/dL (ref 0.2–1.2)
Total Protein: 7.7 g/dL (ref 6.0–8.3)

## 2024-03-10 LAB — TSH: TSH: 1.34 u[IU]/mL (ref 0.35–5.50)

## 2024-03-10 LAB — LIPID PANEL
Cholesterol: 218 mg/dL — ABNORMAL HIGH (ref 0–200)
HDL: 59.4 mg/dL (ref >39.00)
LDL Cholesterol: 129 mg/dL — ABNORMAL HIGH (ref 0–99)
NonHDL: 158.56
Total CHOL/HDL Ratio: 4
Triglycerides: 146 mg/dL (ref 0.0–149.0)
VLDL: 29.2 mg/dL (ref 0.0–40.0)

## 2024-03-10 LAB — HEMOGLOBIN A1C: Hgb A1c MFr Bld: 5.7 % (ref 4.6–6.5)

## 2024-03-10 MED ORDER — TIRZEPATIDE-WEIGHT MANAGEMENT 2.5 MG/0.5ML ~~LOC~~ SOLN
2.5000 mg | SUBCUTANEOUS | 1 refills | Status: DC
Start: 2024-03-10 — End: 2024-03-11

## 2024-03-10 MED ORDER — BUDESONIDE-FORMOTEROL FUMARATE 80-4.5 MCG/ACT IN AERO: 2.0000 | INHALATION_SPRAY | Freq: Two times a day (BID) | RESPIRATORY_TRACT | 3 refills | Status: AC
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
## 2024-03-10 NOTE — Patient Instructions (Signed)
 Please return in 3 months for weight check.   I will release your lab results to you on your MyChart account with further instructions. You may see the results before I do, but when I review them I will send you a message with my report or have my assistant call you if things need to be discussed. Please reply to my message with any questions. Thank you!   If you have any questions or concerns, please don't hesitate to send me a message via MyChart or call the office at [phone]. Thank you for visiting with us  today! It's our pleasure caring for you.    VISIT SUMMARY: During today's visit, we discussed the management of your chronic conditions, including asthma, allergic rhinitis, osteoporosis, obesity, and chronic migraines. We also reviewed your general health maintenance needs.  YOUR PLAN: -ASTHMA: Asthma is a condition where your airways narrow and swell, making it difficult to breathe. You will start using Symbicort  to help manage your asthma symptoms, especially during warm, humid months.  -ALLERGIC RHINITIS: Allergic rhinitis is an allergic reaction that causes sneezing, congestion, and a runny nose. You will restart using Flonase  to help manage your allergy symptoms.  -OSTEOPOROSIS: Osteoporosis is a condition that weakens bones, making them fragile and more likely to break. You will have a bone density scan and sign up for a Prolia  copay card. Once you have the card, you will receive a Prolia  injection. Additionally, continue taking calcium  and vitamin D  supplements and engage in regular exercise.  -OBESITY: Obesity is a condition characterized by excessive body fat. We will check your insurance coverage for [facility] or Zepbound. If covered, you will start Zepbound for weight loss. Be aware of potential side effects like nausea, constipation, diarrhea, headaches, mood changes, and fatigue.  -CHRONIC MIGRAINE: Chronic migraines are severe headaches that occur frequently. You will  continue receiving Botox  injections as per your neurologist's plan.  -GENERAL HEALTH MAINTENANCE: You are due for the Prevnar 20 vaccination and need to repeat your blood work. These steps are important for your overall health maintenance.  INSTRUCTIONS: Please schedule a follow-up appointment in three months to assess your response to the weight loss medication and your overall health status.                      Contains text generated by Abridge.                                 Contains text generated by Abridge.

## 2024-03-11 ENCOUNTER — Encounter: Payer: Self-pay | Admitting: Neurology

## 2024-03-11 ENCOUNTER — Ambulatory Visit: Payer: 59 | Admitting: Neurology

## 2024-03-11 VITALS — BP 116/67 | HR 93 | Ht 63.0 in | Wt 206.0 lb

## 2024-03-11 DIAGNOSIS — G43009 Migraine without aura, not intractable, without status migrainosus: Secondary | ICD-10-CM

## 2024-03-11 DIAGNOSIS — G250 Essential tremor: Secondary | ICD-10-CM

## 2024-03-11 DIAGNOSIS — R569 Unspecified convulsions: Secondary | ICD-10-CM

## 2024-03-14 ENCOUNTER — Ambulatory Visit: Payer: Self-pay | Admitting: Family Medicine

## 2024-03-14 NOTE — Progress Notes (Signed)
 Labs reviewed.  The 10-year ASCVD risk score (Arnett DK, et al., 2019) is: 3.1%   Values used to calculate the score:     Age: 63 years     Clincally relevant sex: Female     Is Non-Hispanic African American: No     Diabetic: No     Tobacco smoker: No     Systolic Blood Pressure: 116 mmHg     Is BP treated: No     HDL Cholesterol: 59.4 mg/dL     Total Cholesterol: 218 mg/dL

## 2024-03-28 ENCOUNTER — Encounter: Payer: Self-pay | Admitting: Neurology

## 2024-03-31 ENCOUNTER — Encounter: Payer: Self-pay | Admitting: Family Medicine

## 2024-04-03 ENCOUNTER — Other Ambulatory Visit (HOSPITAL_COMMUNITY): Payer: Self-pay

## 2024-04-03 ENCOUNTER — Encounter: Payer: Self-pay | Admitting: Neurology

## 2024-04-03 ENCOUNTER — Telehealth: Payer: Self-pay

## 2024-04-03 NOTE — Telephone Encounter (Signed)
 Message received from front desk no active insurance.

## 2024-04-04 ENCOUNTER — Encounter: Payer: Self-pay | Admitting: Neurology

## 2024-04-04 ENCOUNTER — Ambulatory Visit: Payer: Self-pay | Admitting: Neurology

## 2024-04-25 ENCOUNTER — Ambulatory Visit (INDEPENDENT_AMBULATORY_CARE_PROVIDER_SITE_OTHER): Payer: Self-pay | Admitting: Neurology

## 2024-04-25 DIAGNOSIS — G43709 Chronic migraine without aura, not intractable, without status migrainosus: Secondary | ICD-10-CM

## 2024-04-25 MED ORDER — ONABOTULINUMTOXINA 100 UNITS IJ SOLR
200.0000 [IU] | Freq: Once | INTRAMUSCULAR | Status: AC
  Administered 2024-04-25: 155 [IU] via INTRAMUSCULAR

## 2024-04-25 MED ORDER — PROPRANOLOL HCL 20 MG PO TABS: 20.0000 mg | ORAL_TABLET | Freq: Two times a day (BID) | ORAL | 1 refills | Status: DC

## 2024-04-25 MED ORDER — ONDANSETRON 4 MG PO TBDP: 4.0000 mg | ORAL_TABLET | Freq: Three times a day (TID) | ORAL | 5 refills | Status: AC | PRN

## 2024-04-25 NOTE — Progress Notes (Signed)

## 2024-04-29 ENCOUNTER — Encounter: Payer: Self-pay | Admitting: Family Medicine

## 2024-05-04 MED ORDER — ZEPBOUND 5 MG/0.5ML ~~LOC~~ SOAJ
5.0000 mg | SUBCUTANEOUS | 2 refills | Status: DC
Start: 2024-05-04 — End: 2024-05-07

## 2024-05-04 NOTE — Addendum Note (Signed)
 Addended by: JODIE GAMMONS on: 05/04/2024 05:57 PM   Modules accepted: Orders

## 2024-05-06 ENCOUNTER — Other Ambulatory Visit: Payer: Self-pay

## 2024-05-07 ENCOUNTER — Other Ambulatory Visit: Payer: Self-pay

## 2024-05-07 MED ORDER — ZEPBOUND 5 MG/0.5ML ~~LOC~~ SOAJ
5.0000 mg | SUBCUTANEOUS | 2 refills | Status: AC
Start: 2024-05-07 — End: ?

## 2024-05-08 ENCOUNTER — Other Ambulatory Visit: Payer: Self-pay

## 2024-05-08 MED ORDER — TIRZEPATIDE-WEIGHT MANAGEMENT 5 MG/0.5ML ~~LOC~~ SOLN: 5.0000 mg | SUBCUTANEOUS | 3 refills | Status: DC

## 2024-05-21 ENCOUNTER — Other Ambulatory Visit: Payer: Self-pay | Admitting: Family Medicine

## 2024-05-30 ENCOUNTER — Other Ambulatory Visit: Payer: Self-pay | Admitting: Neurology

## 2024-06-24 ENCOUNTER — Other Ambulatory Visit: Payer: Self-pay | Admitting: Neurology

## 2024-06-30 ENCOUNTER — Ambulatory Visit: Payer: 59 | Admitting: Internal Medicine

## 2024-07-04 ENCOUNTER — Ambulatory Visit: Admitting: Neurology

## 2024-07-25 ENCOUNTER — Ambulatory Visit: Payer: Self-pay | Admitting: Neurology

## 2024-07-31 ENCOUNTER — Encounter: Payer: Self-pay | Admitting: Neurology

## 2024-09-04 ENCOUNTER — Other Ambulatory Visit: Payer: Self-pay | Admitting: Family Medicine

## 2024-09-04 DIAGNOSIS — J301 Allergic rhinitis due to pollen: Secondary | ICD-10-CM

## 2024-09-04 DIAGNOSIS — J4541 Moderate persistent asthma with (acute) exacerbation: Secondary | ICD-10-CM

## 2024-09-05 ENCOUNTER — Encounter: Payer: Self-pay | Admitting: Family Medicine

## 2024-09-11 ENCOUNTER — Other Ambulatory Visit: Payer: Self-pay | Admitting: Neurology

## 2024-09-14 ENCOUNTER — Encounter: Payer: Self-pay | Admitting: Neurology

## 2024-09-15 MED ORDER — PROPRANOLOL HCL 20 MG PO TABS: 20.0000 mg | ORAL_TABLET | Freq: Two times a day (BID) | ORAL | 1 refills | Status: AC

## 2024-09-16 ENCOUNTER — Encounter: Payer: Self-pay | Admitting: Neurology

## 2024-09-16 ENCOUNTER — Other Ambulatory Visit (HOSPITAL_COMMUNITY): Payer: Self-pay

## 2024-09-16 ENCOUNTER — Telehealth: Payer: Self-pay

## 2024-09-16 NOTE — Telephone Encounter (Signed)
 Pharmacy Patient Advocate Encounter   Received notification from Pt Calls Messages that prior authorization for Botox  200UNIT solution is required/requested.   Insurance verification completed.   The patient is insured through [facility].    Prior Authorization for Botox  200UNIT solution has been DENIED.  Route of administration is not covered.    PA #/Case ID/Reference #: [id number]   **Pharmacy benefits

## 2024-09-16 NOTE — Telephone Encounter (Signed)
 Front desk called patient to check and see if she will using insurance or paying out of pocket for Her Botox  this visit. Just to get started with PA if she did.  New insurance added to Epic, no pic of ID card but verified by phone and system.  She said it will probably not cover and she is ok with payiong out ogf pocket if she needs to, just let her know she said

## 2024-09-17 ENCOUNTER — Other Ambulatory Visit (HOSPITAL_COMMUNITY): Payer: Self-pay

## 2024-09-17 ENCOUNTER — Encounter: Payer: Self-pay | Admitting: Neurology

## 2024-09-17 NOTE — Telephone Encounter (Signed)
 Called [facility] and verified that no PA is required for [id number] or [id number] through Medical. Confirmation [phone] pm. However per the rep there aren't medical benefits for the injection, therefore it would be buy and bill, confirmation # [phone] pm.

## 2024-09-19 ENCOUNTER — Encounter: Payer: Self-pay | Admitting: Internal Medicine

## 2024-09-19 ENCOUNTER — Telehealth: Admitting: Internal Medicine

## 2024-09-19 DIAGNOSIS — E042 Nontoxic multinodular goiter: Secondary | ICD-10-CM

## 2024-09-19 DIAGNOSIS — E063 Autoimmune thyroiditis: Secondary | ICD-10-CM | POA: Diagnosis not present

## 2024-09-19 NOTE — Patient Instructions (Signed)
 Please come back for labs when you feel better.  Let's schedule another thyroid  ultrasound.  Please return for a follow-up appointment in 1 year.
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
## 2024-09-25 ENCOUNTER — Other Ambulatory Visit: Payer: Self-pay | Admitting: Neurology

## 2024-09-25 MED ORDER — LEVETIRACETAM 500 MG PO TABS: ORAL_TABLET | ORAL | 1 refills | Status: AC

## 2024-09-25 MED ORDER — CYCLOBENZAPRINE HCL 10 MG PO TABS: ORAL_TABLET | ORAL | 1 refills | Status: AC

## 2024-09-25 MED ORDER — SUMATRIPTAN SUCCINATE 100 MG PO TABS: ORAL_TABLET | ORAL | 1 refills | Status: AC

## 2024-09-25 MED ORDER — PROMETHAZINE HCL 50 MG PO TABS: 50.0000 mg | ORAL_TABLET | Freq: Four times a day (QID) | ORAL | 1 refills | Status: AC | PRN

## 2024-10-03 ENCOUNTER — Ambulatory Visit: Payer: Self-pay | Admitting: Neurology

## 2024-10-09 ENCOUNTER — Telehealth: Payer: Self-pay | Admitting: Neurology

## 2024-10-09 NOTE — Telephone Encounter (Signed)
 Pt would like 90 day refills going forward please

## 2024-10-10 ENCOUNTER — Ambulatory Visit: Admitting: Neurology

## 2024-10-27 ENCOUNTER — Other Ambulatory Visit (HOSPITAL_BASED_OUTPATIENT_CLINIC_OR_DEPARTMENT_OTHER): Payer: Self-pay

## 2024-11-07 ENCOUNTER — Ambulatory Visit: Admitting: Neurology

## 2024-12-10 ENCOUNTER — Ambulatory Visit: Admitting: Neurology

## 2024-12-15 ENCOUNTER — Ambulatory Visit: Admitting: Neurology

## 2025-01-02 ENCOUNTER — Ambulatory Visit: Payer: Self-pay | Admitting: Neurology

## 2025-01-16 ENCOUNTER — Ambulatory Visit: Payer: Self-pay | Admitting: Neurology
